# Patient Record
Sex: Female | Born: 1940 | Race: White | Hispanic: No | Marital: Married | Smoking: Never smoker
Health system: Southern US, Community
[De-identification: ages and names within clinical notes are randomized; demographics above are authoritative.]

## PROBLEM LIST (undated history)

## (undated) DIAGNOSIS — E039 Hypothyroidism, unspecified: Secondary | ICD-10-CM

## (undated) DIAGNOSIS — D649 Anemia, unspecified: Secondary | ICD-10-CM

## (undated) DIAGNOSIS — E7800 Pure hypercholesterolemia, unspecified: Secondary | ICD-10-CM

## (undated) DIAGNOSIS — I809 Phlebitis and thrombophlebitis of unspecified site: Secondary | ICD-10-CM

## (undated) DIAGNOSIS — K648 Other hemorrhoids: Secondary | ICD-10-CM

## (undated) DIAGNOSIS — Z860100 Personal history of colon polyps, unspecified: Secondary | ICD-10-CM

## (undated) DIAGNOSIS — K219 Gastro-esophageal reflux disease without esophagitis: Secondary | ICD-10-CM

## (undated) DIAGNOSIS — K5790 Diverticulosis of intestine, part unspecified, without perforation or abscess without bleeding: Secondary | ICD-10-CM

## (undated) HISTORY — PX: LAPAROSCOPIC GASTRIC BANDING: SHX1100

## (undated) HISTORY — PX: COLON SURGERY: SHX602

## (undated) HISTORY — DX: Phlebitis and thrombophlebitis of unspecified site: I80.9

## (undated) HISTORY — PX: DIAGNOSTIC LAPAROSCOPY: SUR761

## (undated) HISTORY — DX: Hypothyroidism, unspecified: E03.9

## (undated) HISTORY — DX: Gastro-esophageal reflux disease without esophagitis: K21.9

## (undated) HISTORY — DX: Anemia, unspecified: D64.9

## (undated) HISTORY — DX: Pure hypercholesterolemia, unspecified: E78.00

## (undated) HISTORY — PX: COLONOSCOPY: SHX174

---

## 1972-01-10 HISTORY — PX: URETHRAL DIVERTICULUM REPAIR: SHX5148

## 2003-12-21 ENCOUNTER — Ambulatory Visit: Payer: Self-pay | Admitting: Unknown Physician Specialty

## 2004-05-27 ENCOUNTER — Ambulatory Visit: Payer: Self-pay

## 2004-07-07 ENCOUNTER — Ambulatory Visit: Payer: Self-pay | Admitting: Internal Medicine

## 2004-07-20 ENCOUNTER — Ambulatory Visit: Payer: Self-pay | Admitting: Internal Medicine

## 2005-03-14 ENCOUNTER — Ambulatory Visit: Payer: Self-pay | Admitting: Unknown Physician Specialty

## 2006-03-20 ENCOUNTER — Ambulatory Visit: Payer: Self-pay | Admitting: Unknown Physician Specialty

## 2007-03-28 ENCOUNTER — Ambulatory Visit: Payer: Self-pay | Admitting: Unknown Physician Specialty

## 2007-07-09 ENCOUNTER — Ambulatory Visit: Payer: Self-pay | Admitting: Gastroenterology

## 2007-11-05 ENCOUNTER — Ambulatory Visit: Payer: Self-pay | Admitting: Gastroenterology

## 2007-11-29 ENCOUNTER — Ambulatory Visit: Payer: Self-pay | Admitting: Surgery

## 2008-01-07 ENCOUNTER — Ambulatory Visit: Payer: Self-pay | Admitting: Surgery

## 2008-01-14 ENCOUNTER — Inpatient Hospital Stay: Payer: Self-pay | Admitting: Surgery

## 2008-04-29 ENCOUNTER — Ambulatory Visit: Payer: Self-pay | Admitting: Unknown Physician Specialty

## 2009-01-18 ENCOUNTER — Ambulatory Visit: Payer: Self-pay | Admitting: Gastroenterology

## 2009-01-22 ENCOUNTER — Ambulatory Visit: Payer: Self-pay | Admitting: Gastroenterology

## 2009-06-09 ENCOUNTER — Ambulatory Visit: Payer: Self-pay | Admitting: Unknown Physician Specialty

## 2010-05-25 ENCOUNTER — Ambulatory Visit: Payer: Self-pay | Admitting: Internal Medicine

## 2010-06-10 ENCOUNTER — Ambulatory Visit: Payer: Self-pay | Admitting: Internal Medicine

## 2010-08-22 ENCOUNTER — Ambulatory Visit: Payer: Self-pay | Admitting: Unknown Physician Specialty

## 2010-10-17 LAB — BASIC METABOLIC PANEL: Glucose: 89 mg/dL

## 2010-10-18 LAB — HEPATIC FUNCTION PANEL
AST: 17 U/L (ref 13–35)
Alkaline Phosphatase: 67 U/L (ref 25–125)

## 2010-10-18 LAB — CBC AND DIFFERENTIAL: Platelets: 204 10*3/uL (ref 150–399)

## 2010-10-20 ENCOUNTER — Ambulatory Visit: Payer: Self-pay | Admitting: Internal Medicine

## 2010-11-17 ENCOUNTER — Ambulatory Visit: Payer: Self-pay | Admitting: Gastroenterology

## 2011-01-16 ENCOUNTER — Ambulatory Visit: Payer: Self-pay | Admitting: Gastroenterology

## 2011-01-19 LAB — PATHOLOGY REPORT

## 2011-01-26 LAB — HM COLONOSCOPY

## 2011-08-11 ENCOUNTER — Ambulatory Visit: Payer: Self-pay | Admitting: Orthopedic Surgery

## 2011-12-11 ENCOUNTER — Telehealth: Payer: Self-pay | Admitting: Internal Medicine

## 2011-12-11 NOTE — Telephone Encounter (Signed)
Caller: Sally/Patient; Phone: [phone]; Reason for Call: Patient is former patient of Dr. Lorin Picket, but has not been seen at Dr.  Marina Goodell new location.   Per Daytime Triage Information regarding new patient -unable to triage.  Transferred patient to office for  scheduling.

## 2011-12-11 NOTE — Telephone Encounter (Signed)
I can see her on 12/13/11 at 1:00.   Work in for this.  If any acute problems before this - needs evaluation.

## 2011-12-11 NOTE — Telephone Encounter (Signed)
Would continue the saline nasal spray - flush nose at least 2-3x/day and continue flonase.  If not allergic to robitussin/mucinex (guafinicin) then she can try mucinex in the am and robitussin in the evening.  If she is having persistent problems, I do recommend evaluation.

## 2011-12-11 NOTE — Telephone Encounter (Signed)
Patient has started having head ache on Saturday like she has in the past when she starts cleaning. She might have had a fever on Thursday last week.  She states that she has been seen at [facility] several times for this and states it is like a sinus infection.  Please advise when you would like to see patient. Triage couldn't triage due to patient new to our system.

## 2011-12-11 NOTE — Telephone Encounter (Signed)
Sent to [facility] in error.

## 2011-12-11 NOTE — Telephone Encounter (Signed)
Pt cannot come that day.  She is using generic flonase and salt spray.  Pt would like to know if there is any over the counter stuff she could get

## 2011-12-11 NOTE — Telephone Encounter (Signed)
Called pt.  Pt stated that she spoke with nurse here and she is waiting on call back

## 2011-12-11 NOTE — Telephone Encounter (Signed)
Informed patient of Dr. Lorin Picket recommendation and she agreed;will contact office for eval if symptoms become worse

## 2012-01-31 ENCOUNTER — Encounter: Payer: Self-pay | Admitting: *Deleted

## 2012-02-02 ENCOUNTER — Encounter: Payer: Self-pay | Admitting: Internal Medicine

## 2012-02-02 ENCOUNTER — Ambulatory Visit (INDEPENDENT_AMBULATORY_CARE_PROVIDER_SITE_OTHER): Payer: 59 | Admitting: Internal Medicine

## 2012-02-02 VITALS — BP 124/78 | HR 70 | Temp 98.4°F | Ht 65.5 in | Wt 266.2 lb

## 2012-02-02 DIAGNOSIS — E039 Hypothyroidism, unspecified: Secondary | ICD-10-CM

## 2012-02-02 DIAGNOSIS — Z860100 Personal history of colon polyps, unspecified: Secondary | ICD-10-CM

## 2012-02-02 DIAGNOSIS — R5381 Other malaise: Secondary | ICD-10-CM

## 2012-02-02 DIAGNOSIS — Z23 Encounter for immunization: Secondary | ICD-10-CM

## 2012-02-02 DIAGNOSIS — Z139 Encounter for screening, unspecified: Secondary | ICD-10-CM

## 2012-02-02 DIAGNOSIS — E559 Vitamin D deficiency, unspecified: Secondary | ICD-10-CM

## 2012-02-02 DIAGNOSIS — R5383 Other fatigue: Secondary | ICD-10-CM

## 2012-02-02 DIAGNOSIS — E7800 Pure hypercholesterolemia, unspecified: Secondary | ICD-10-CM

## 2012-02-02 DIAGNOSIS — K219 Gastro-esophageal reflux disease without esophagitis: Secondary | ICD-10-CM

## 2012-02-02 MED ORDER — PNEUMOCOCCAL VAC POLYVALENT 25 MCG/0.5ML IJ INJ
0.5000 mL | INJECTION | Freq: Once | INTRAMUSCULAR | Status: AC
  Administered 2012-02-02: 0.5 mL via INTRAMUSCULAR

## 2012-02-04 ENCOUNTER — Encounter: Payer: Self-pay | Admitting: Internal Medicine

## 2012-02-04 DIAGNOSIS — K219 Gastro-esophageal reflux disease without esophagitis: Secondary | ICD-10-CM | POA: Insufficient documentation

## 2012-02-04 DIAGNOSIS — E559 Vitamin D deficiency, unspecified: Secondary | ICD-10-CM | POA: Insufficient documentation

## 2012-02-04 DIAGNOSIS — E039 Hypothyroidism, unspecified: Secondary | ICD-10-CM | POA: Insufficient documentation

## 2012-02-04 DIAGNOSIS — Z86010 Personal history of colonic polyps: Secondary | ICD-10-CM | POA: Insufficient documentation

## 2012-02-04 DIAGNOSIS — E7800 Pure hypercholesterolemia, unspecified: Secondary | ICD-10-CM | POA: Insufficient documentation

## 2012-02-04 NOTE — Assessment & Plan Note (Signed)
Low cholesterol diet, exercise and weight loss.  Check lipid panel.   

## 2012-02-04 NOTE — Assessment & Plan Note (Signed)
EGD 01/16/11 revealed multiple gastric polyps and a hiatal hernia.  Will start zantac regularly now.  Felt the nexium caused increased gas.  Follow.

## 2012-02-04 NOTE — Assessment & Plan Note (Signed)
Continue synthroid.  Check tsh.   

## 2012-02-04 NOTE — Assessment & Plan Note (Signed)
S/p laparoscopic assisted partial colectomy for a tubular adenomatous polyp.  Follow up colonoscopy 01/18/09 revealed diverticulosis, a 3mm polyp in the transverse colon and non bleeding internal hemorrhoids (abnormal appearing appendicial orifice.  Subsequently had a CT scan and found to have a redundant colon.  Repeat colonoscopy 1/13 - diverticulosis and one polyp.  Recommended follow up colonoscopy 01/2014.

## 2012-02-04 NOTE — Progress Notes (Signed)
  Subjective:    Patient ID: Jodi Harris, female    DOB: September 25, 1940, 72 y.o.   MRN: [id number]  HPI 72 year old female with past history of hypothyroidism and gastric surgery (lap band) who comes in today for a scheduled follow up.  States she is having some increased gas.  Had more when she was taking nexium.  Does not seem to notice as much with zantac.  Takes tums.  Some occasional reflux. Not taking the zantac regularly.  No abdominal pain now.  No urine change or bowel change.  Not exercising regularly.  Plans to get more serious about her diet and exercise.  Has not been doing as well watching what she eats.   Past Medical History  Diagnosis Date  . Hypothyroidism   . Hypercholesterolemia   . GERD (gastroesophageal reflux disease)   . Anemia   . Thrombophlebitis     varicosities    Current Outpatient Prescriptions on File Prior to Visit  Medication Sig Dispense Refill  . Cholecalciferol (VITAMIN D-3 PO) Take 2,000 Units by mouth daily.      . fluticasone (FLONASE) 50 MCG/ACT nasal spray Place 2 sprays into the nose as needed.       Marland Kitchen levothyroxine (SYNTHROID, LEVOTHROID) 125 MCG tablet Take 125 mcg by mouth daily.      . Multiple Vitamins-Minerals (MULTIVITAMIN PO) Take by mouth.      . Naproxen Sodium (ALEVE PO) Take by mouth as needed.       . Ranitidine HCl (ZANTAC PO) Take by mouth.        Review of Systems Patient denies any headache, lightheadedness or dizziness.  No sinus or allergy symptoms.  No chest pain, tightness or palpotations.  No increased shortness of breath, cough or congestion.  No nausea or vomiting.  Some reflux and increased gas as outlined.  No abdominal pain or cramping.  No bowel change, such as diarrhea, constipation, BRBPR or melana.  No urine change.   Some fatigue.  Plans to start exercising more.       Objective:   Physical Exam Filed Vitals:   02/02/12 1022  BP: 124/78  Pulse: 70  Temp: 98.4 F (36.9 C)   Blood pressure recheck:  122/78,  pulse 18  71 year old female in no acute distress.   HEENT:  Nares- clear.  Oropharynx - without lesions. NECK:  Supple.  Nontender.  No audible bruit.  HEART:  Appears to be regular. LUNGS:  No crackles or wheezing audible.  Respirations even and unlabored.  RADIAL PULSE:  Equal bilaterally.   ABDOMEN:  Soft, nontender.  Bowel sounds present and normal.  No audible abdominal bruit.   EXTREMITIES:  No increased edema present.  DP pulses palpable and equal bilaterally.           Assessment & Plan:  INCREASED GAS.  Will remain off the PPI.  Trial of zantac regularly.  Monitor if dietary triggers.  Follow.    INCREASED PSYCHOSOCIAL STRESSORS.  On no medication now.  Doing well.  Follow.    FATIGUE.  See above.  Check cbc, met c and tsh.  Plans to start exercising.    GYN.  Has been followed at [facility].  Obtain records.    HEALTH MAINTENANCE.  Breast, pelvic and paps at gyn.  Colonoscopy as outlined below.  Schedule mammogram.

## 2012-02-04 NOTE — Assessment & Plan Note (Signed)
Taking her vitamin d supplements.  Follow vitamin D level.    

## 2012-02-07 ENCOUNTER — Other Ambulatory Visit: Payer: 59

## 2012-02-08 ENCOUNTER — Other Ambulatory Visit: Payer: 59

## 2012-02-15 ENCOUNTER — Other Ambulatory Visit (INDEPENDENT_AMBULATORY_CARE_PROVIDER_SITE_OTHER): Payer: 59

## 2012-02-15 DIAGNOSIS — R5383 Other fatigue: Secondary | ICD-10-CM

## 2012-02-15 DIAGNOSIS — E7800 Pure hypercholesterolemia, unspecified: Secondary | ICD-10-CM

## 2012-02-15 DIAGNOSIS — R5381 Other malaise: Secondary | ICD-10-CM

## 2012-02-15 LAB — CBC WITH DIFFERENTIAL/PLATELET
Basophils Absolute: 0 10*3/uL (ref 0.0–0.1)
Basophils Relative: 0.5 % (ref 0.0–3.0)
Eosinophils Absolute: 0.1 10*3/uL (ref 0.0–0.7)
HCT: 39.1 % (ref 36.0–46.0)
Hemoglobin: 12.8 g/dL (ref 12.0–15.0)
Lymphs Abs: 1.5 10*3/uL (ref 0.7–4.0)
MCHC: 32.8 g/dL (ref 30.0–36.0)
MCV: 87.4 fl (ref 78.0–100.0)
Neutro Abs: 2.6 10*3/uL (ref 1.4–7.7)
RBC: 4.47 Mil/uL (ref 3.87–5.11)
RDW: 14.1 % (ref 11.5–14.6)

## 2012-02-15 LAB — COMPREHENSIVE METABOLIC PANEL
ALT: 12 U/L (ref 0–35)
AST: 19 U/L (ref 0–37)
BUN: 17 mg/dL (ref 6–23)
Calcium: 8.8 mg/dL (ref 8.4–10.5)
Chloride: 102 mEq/L (ref 96–112)
Creatinine, Ser: 0.8 mg/dL (ref 0.4–1.2)
GFR: 80.91 mL/min (ref >60.00)
Total Bilirubin: 0.7 mg/dL (ref 0.3–1.2)

## 2012-02-15 LAB — LIPID PANEL
Cholesterol: 208 mg/dL — ABNORMAL HIGH (ref 0–200)
HDL: 53.8 mg/dL (ref >39.00)
Total CHOL/HDL Ratio: 4
Triglycerides: 69 mg/dL (ref 0.0–149.0)

## 2012-02-15 LAB — LDL CHOLESTEROL, DIRECT: Direct LDL: 129.8 mg/dL

## 2012-02-29 ENCOUNTER — Ambulatory Visit: Payer: Self-pay | Admitting: Internal Medicine

## 2012-03-13 ENCOUNTER — Encounter: Payer: Self-pay | Admitting: Internal Medicine

## 2012-04-19 ENCOUNTER — Other Ambulatory Visit: Payer: Self-pay | Admitting: *Deleted

## 2012-04-19 MED ORDER — RANITIDINE HCL 150 MG PO TABS: 150.0000 mg | ORAL_TABLET | Freq: Every day | ORAL | Status: DC

## 2012-04-19 NOTE — Telephone Encounter (Signed)
Please advise.... Refill on zantac

## 2012-04-20 ENCOUNTER — Telehealth: Payer: Self-pay | Admitting: Internal Medicine

## 2012-04-20 MED ORDER — RANITIDINE HCL 150 MG PO TABS: 150.0000 mg | ORAL_TABLET | Freq: Two times a day (BID) | ORAL | Status: DC

## 2012-04-20 NOTE — Telephone Encounter (Signed)
rx corrected.  Ranitidine should be 150mg  bid.  Corrected.  Refilled x 4

## 2012-05-13 ENCOUNTER — Telehealth: Payer: Self-pay | Admitting: *Deleted

## 2012-05-13 MED ORDER — LEVOTHYROXINE SODIUM 125 MCG PO TABS: 125.0000 ug | ORAL_TABLET | Freq: Every day | ORAL | Status: DC

## 2012-05-13 NOTE — Telephone Encounter (Signed)
Sent refill medication of Synthroid to pharmacy #90 1rf

## 2012-05-13 NOTE — Telephone Encounter (Signed)
Synthroid 125 mcg tablet  #90  Take one tablet daily

## 2012-06-20 ENCOUNTER — Ambulatory Visit (INDEPENDENT_AMBULATORY_CARE_PROVIDER_SITE_OTHER): Payer: 59 | Admitting: Internal Medicine

## 2012-06-20 ENCOUNTER — Encounter: Payer: Self-pay | Admitting: Internal Medicine

## 2012-06-20 VITALS — BP 110/60 | HR 61 | Temp 98.0°F | Ht 65.5 in | Wt 273.2 lb

## 2012-06-20 DIAGNOSIS — Z86010 Personal history of colonic polyps: Secondary | ICD-10-CM

## 2012-06-20 DIAGNOSIS — E559 Vitamin D deficiency, unspecified: Secondary | ICD-10-CM

## 2012-06-20 DIAGNOSIS — K219 Gastro-esophageal reflux disease without esophagitis: Secondary | ICD-10-CM

## 2012-06-20 DIAGNOSIS — E039 Hypothyroidism, unspecified: Secondary | ICD-10-CM

## 2012-06-20 DIAGNOSIS — R350 Frequency of micturition: Secondary | ICD-10-CM

## 2012-06-20 DIAGNOSIS — E7800 Pure hypercholesterolemia, unspecified: Secondary | ICD-10-CM

## 2012-06-20 LAB — POCT URINALYSIS DIPSTICK
Bilirubin, UA: NEGATIVE
Blood, UA: NEGATIVE
Glucose, UA: NEGATIVE
Ketones, UA: NEGATIVE
Leukocytes, UA: NEGATIVE
Nitrite, UA: NEGATIVE

## 2012-06-20 MED ORDER — FLUOXETINE HCL 10 MG PO CAPS: 10.0000 mg | ORAL_CAPSULE | Freq: Every day | ORAL | Status: DC

## 2012-06-22 LAB — URINE CULTURE: Organism ID, Bacteria: NO GROWTH

## 2012-06-30 ENCOUNTER — Encounter: Payer: Self-pay | Admitting: Internal Medicine

## 2012-06-30 NOTE — Assessment & Plan Note (Signed)
EGD 01/16/11 revealed multiple gastric polyps and a hiatal hernia.  Apparently tolerates nexium 20mg  q day.  Follow.

## 2012-06-30 NOTE — Assessment & Plan Note (Signed)
S/p laparoscopic assisted partial colectomy for a tubular adenomatous polyp.  Follow up colonoscopy 01/18/09 revealed diverticulosis, a 3mm polyp in the transverse colon and non bleeding internal hemorrhoids (abnormal appearing appendicial orifice.  Subsequently had a CT scan and found to have a redundant colon.  Repeat colonoscopy 1/13 - diverticulosis and one polyp.  Recommended follow up colonoscopy 01/2014.  Bowels stable.   

## 2012-06-30 NOTE — Assessment & Plan Note (Signed)
Low cholesterol diet, exercise and weight loss.  Follow lipid panel.  

## 2012-06-30 NOTE — Assessment & Plan Note (Signed)
Taking her vitamin d supplements.  Follow vitamin D level.    

## 2012-06-30 NOTE — Assessment & Plan Note (Signed)
Continue synthroid. Follow tsh.  

## 2012-06-30 NOTE — Progress Notes (Signed)
  Subjective:    Patient ID: Jodi Harris, female    DOB: [birth year], 72 y.o.   MRN: [id number]  HPI 72 year old female with past history of hypothyroidism and gastric surgery (lap band) who comes in today for a scheduled follow up.  Breathing stable. Discussed taking zantac for her reflux. Previously had some increased gas with 40mg  of nexium.  Tolerates the 20mg  nexium.   No urine change or bowel change.  Not exercising regularly.  Plans to get more serious about her diet and exercise.  Has not been doing as well watching what she eats.  Increased stress.  Previously on Wellbutrin.  Feels she needs something to help level things off.     Past Medical History  Diagnosis Date  . Hypothyroidism   . Hypercholesterolemia   . GERD (gastroesophageal reflux disease)   . Anemia   . Thrombophlebitis     varicosities    Current Outpatient Prescriptions on File Prior to Visit  Medication Sig Dispense Refill  . calcium carbonate (TUMS EX) 750 MG chewable tablet Chew 1 tablet by mouth daily.      . Cholecalciferol (VITAMIN D-3 PO) Take 2,000 Units by mouth daily.      . fluticasone (FLONASE) 50 MCG/ACT nasal spray Place 2 sprays into the nose as needed.       Marland Kitchen levothyroxine (SYNTHROID, LEVOTHROID) 125 MCG tablet Take 1 tablet (125 mcg total) by mouth daily.  90 tablet  1  . Multiple Vitamins-Minerals (MULTIVITAMIN PO) Take by mouth.      . Naproxen Sodium (ALEVE PO) Take by mouth as needed.        No current facility-administered medications on file prior to visit.    Review of Systems Patient denies any headache, lightheadedness or dizziness.  No sinus or allergy symptoms.  No chest pain, tightness or palpotations.  No increased shortness of breath, cough or congestion.  No nausea or vomiting.  Some reflux and increased gas as outlined.  No abdominal pain or cramping.  No bowel change, such as diarrhea, constipation, BRBPR or melana.  No urine change.  Plans to start exercising more.        Objective:   Physical Exam  Filed Vitals:   06/20/12 1513  BP: 110/60  Pulse: 61  Temp: 98 F (88.73 C)   72 year old female in no acute distress.   HEENT:  Nares- clear.  Oropharynx - without lesions. NECK:  Supple.  Nontender.  No audible bruit.  HEART:  Appears to be regular. LUNGS:  No crackles or wheezing audible.  Respirations even and unlabored.  RADIAL PULSE:  Equal bilaterally.   ABDOMEN:  Soft, nontender.  Bowel sounds present and normal.  No audible abdominal bruit.   EXTREMITIES:  No increased edema present.  DP pulses palpable and equal bilaterally.           Assessment & Plan:  INCREASED PSYCHOSOCIAL STRESSORS.  Discussed at length with her today.  Discussed treatment options.  Will start Prozac 10mg  q day.  Follow.    URINARY FREQUENCY.  Will check urinalysis and culture to confirm no infection.   GYN.  Has been followed at [facility].  States up to date.  Need records.     HEALTH MAINTENANCE.  Breast, pelvic and paps at gyn.  Colonoscopy as outlined.  Mammogram 02/29/12 - Birads I.

## 2012-08-05 ENCOUNTER — Ambulatory Visit (INDEPENDENT_AMBULATORY_CARE_PROVIDER_SITE_OTHER): Payer: 59 | Admitting: Internal Medicine

## 2012-08-05 ENCOUNTER — Encounter: Payer: Self-pay | Admitting: Internal Medicine

## 2012-08-05 VITALS — BP 110/60 | HR 62 | Temp 98.1°F | Ht 65.5 in | Wt 273.8 lb

## 2012-08-05 DIAGNOSIS — E7800 Pure hypercholesterolemia, unspecified: Secondary | ICD-10-CM

## 2012-08-05 DIAGNOSIS — Z86010 Personal history of colonic polyps: Secondary | ICD-10-CM

## 2012-08-05 DIAGNOSIS — E039 Hypothyroidism, unspecified: Secondary | ICD-10-CM

## 2012-08-05 DIAGNOSIS — K219 Gastro-esophageal reflux disease without esophagitis: Secondary | ICD-10-CM

## 2012-08-05 DIAGNOSIS — E559 Vitamin D deficiency, unspecified: Secondary | ICD-10-CM

## 2012-08-05 MED ORDER — FLUOXETINE HCL 10 MG PO CAPS: 10.0000 mg | ORAL_CAPSULE | Freq: Every day | ORAL | Status: DC

## 2012-08-05 NOTE — Assessment & Plan Note (Signed)
S/p laparoscopic assisted partial colectomy for a tubular adenomatous polyp.  Follow up colonoscopy 01/18/09 revealed diverticulosis, a 3mm polyp in the transverse colon and non bleeding internal hemorrhoids (abnormal appearing appendicial orifice.  Subsequently had a CT scan and found to have a redundant colon.  Repeat colonoscopy 1/13 - diverticulosis and one polyp.  Recommended follow up colonoscopy 01/2014.  Bowels stable.   

## 2012-08-05 NOTE — Assessment & Plan Note (Signed)
Low cholesterol diet, exercise and weight loss.  Follow lipid panel.  

## 2012-08-05 NOTE — Assessment & Plan Note (Signed)
Taking her vitamin d supplements.  Follow vitamin D level.    

## 2012-08-05 NOTE — Assessment & Plan Note (Signed)
Continue synthroid. Follow tsh.  

## 2012-08-05 NOTE — Progress Notes (Signed)
Subjective:    Patient ID: Jodi Harris, female    DOB: August 18, 1940, 72 y.o.   MRN: [id number]  HPI 72 year old female with past history of hypothyroidism and gastric surgery (lap band) who comes in today for a scheduled follow up.  Breathing stable.   No urine change or bowel change.  Not exercising regularly.  Plans to get more serious about her diet and exercise.  Planning to start going to water aerobics with a group.  Has not been doing as well watching what she eats.  Again,plans to start.  saw the surgeon for her lap band.  Has follow up with him 08/13/12.  Last visit was started on prozac.  Is tolerating and feels better.  Sleeping better.  Feels this dose is adequate.  Occasionally will feel a little light headed, but does not relate this to the medication.  Rarely occurs.  Goes away quickly.  No significant light headedness or dizziness.  No headache.  Overall she feels she is doing well.    Past Medical History  Diagnosis Date  . Hypothyroidism   . Hypercholesterolemia   . GERD (gastroesophageal reflux disease)   . Anemia   . Thrombophlebitis     varicosities    Current Outpatient Prescriptions on File Prior to Visit  Medication Sig Dispense Refill  . calcium carbonate (TUMS EX) 750 MG chewable tablet Chew 1 tablet by mouth as needed.       . Cholecalciferol (VITAMIN D-3 PO) Take 2,000 Units by mouth daily.      Marland Kitchen esomeprazole (NEXIUM) 20 MG capsule Take 20 mg by mouth daily.      . fluticasone (FLONASE) 50 MCG/ACT nasal spray Place 2 sprays into the nose as needed.       Marland Kitchen levothyroxine (SYNTHROID, LEVOTHROID) 125 MCG tablet Take 1 tablet (125 mcg total) by mouth daily.  90 tablet  1  . Multiple Vitamins-Minerals (MULTIVITAMIN PO) Take by mouth.      . Naproxen Sodium (ALEVE PO) Take by mouth as needed.        No current facility-administered medications on file prior to visit.    Review of Systems Patient denies any headache.  Some minimal congestion at times, which she feels  may contribute to some of the light headedness.  No chest pain, tightness or palpitations.  No increased shortness of breath, cough or congestion.  No nausea or vomiting.  No acid reflux reported.   No abdominal pain or cramping.  No bowel change, such as diarrhea, constipation, BRBPR or melana.  No urine change.  Plans to start exercising more.  Sleeping better on Prozac.       Objective:   Physical Exam  Filed Vitals:   08/05/12 1049  BP: 110/60  Pulse: 62  Temp: 98.1 F (36.7 C)   Blood pressure recheck:  122/70, pulse 18  72 year old female in no acute distress.   HEENT:  Nares- clear.  Oropharynx - without lesions.  TMs without erythema - clear.  NECK:  Supple.  Nontender.  No audible bruit.  HEART:  Appears to be regular. LUNGS:  No crackles or wheezing audible.  Respirations even and unlabored.  RADIAL PULSE:  Equal bilaterally.   ABDOMEN:  Soft, nontender.  Bowel sounds present and normal.  No audible abdominal bruit.   EXTREMITIES:  No increased edema present.  DP pulses palpable and equal bilaterally.           Assessment & Plan:  INCREASED PSYCHOSOCIAL  STRESSORS.  On Prozac 10mg  q day.  Doing better.  Sleeping better.  Follow.    GYN.  Has been followed at [facility].  States up to date.  Need records.    LIGHT HEADEDNESS.  Not a significant issue for her.  Will use her saline nasal spray and treat allergies and congestion.  Follow.  Will notify me if a persistent problem.     HEALTH MAINTENANCE.  Breast, pelvic and paps at gyn.  Colonoscopy as outlined.  Mammogram 02/29/12 - Birads I.

## 2012-08-05 NOTE — Assessment & Plan Note (Signed)
EGD 01/16/11 revealed multiple gastric polyps and a hiatal hernia.  Tolerates nexium 20mg q day.  Follow.    

## 2012-09-10 ENCOUNTER — Other Ambulatory Visit: Payer: Self-pay | Admitting: *Deleted

## 2012-12-09 ENCOUNTER — Encounter (INDEPENDENT_AMBULATORY_CARE_PROVIDER_SITE_OTHER): Payer: Self-pay

## 2012-12-09 ENCOUNTER — Ambulatory Visit (INDEPENDENT_AMBULATORY_CARE_PROVIDER_SITE_OTHER): Payer: 59 | Admitting: Internal Medicine

## 2012-12-09 ENCOUNTER — Encounter: Payer: Self-pay | Admitting: Internal Medicine

## 2012-12-09 VITALS — BP 120/60 | HR 64 | Temp 97.9°F | Ht 65.5 in | Wt 256.0 lb

## 2012-12-09 DIAGNOSIS — E039 Hypothyroidism, unspecified: Secondary | ICD-10-CM

## 2012-12-09 DIAGNOSIS — Z860100 Personal history of colon polyps, unspecified: Secondary | ICD-10-CM

## 2012-12-09 DIAGNOSIS — E7800 Pure hypercholesterolemia, unspecified: Secondary | ICD-10-CM

## 2012-12-09 DIAGNOSIS — E559 Vitamin D deficiency, unspecified: Secondary | ICD-10-CM

## 2012-12-09 DIAGNOSIS — K219 Gastro-esophageal reflux disease without esophagitis: Secondary | ICD-10-CM

## 2012-12-09 MED ORDER — LEVOTHYROXINE SODIUM 125 MCG PO TABS: 125.0000 ug | ORAL_TABLET | Freq: Every day | ORAL | Status: DC

## 2012-12-09 NOTE — Progress Notes (Signed)
Subjective:    Patient ID: Jodi Harris, female    DOB: 10-27-1940, 72 y.o.   MRN: [id number]  HPI 72 year old female with past history of hypothyroidism and gastric surgery (lap band) who comes in today for a scheduled follow up.  Breathing stable.   No urine change or bowel change.  She is watching what she eats.  Has cut out bread.  Has lost weight.  Feels better.  On prozac.  Is tolerating and feels better.  Sleeping better.  Feels this dose is adequate.   No significant light headedness or dizziness.  No headache.  Overall she feels she is doing well.  She does report that occasionally she hears her heartbeat in her left ear.  No pain.  Blood pressure doing well.     Past Medical History  Diagnosis Date  . Hypothyroidism   . Hypercholesterolemia   . GERD (gastroesophageal reflux disease)   . Anemia   . Thrombophlebitis     varicosities    Current Outpatient Prescriptions on File Prior to Visit  Medication Sig Dispense Refill  . calcium carbonate (TUMS EX) 750 MG chewable tablet Chew 1 tablet by mouth as needed.       . Cholecalciferol (VITAMIN D-3 PO) Take 2,000 Units by mouth daily.      Marland Kitchen esomeprazole (NEXIUM) 20 MG capsule Take 20 mg by mouth daily.      Marland Kitchen FLUoxetine (PROZAC) 10 MG capsule Take 1 capsule (10 mg total) by mouth daily.  90 capsule  1  . fluticasone (FLONASE) 50 MCG/ACT nasal spray Place 2 sprays into the nose as needed.       Marland Kitchen levothyroxine (SYNTHROID, LEVOTHROID) 125 MCG tablet Take 1 tablet (125 mcg total) by mouth daily.  90 tablet  1  . Multiple Vitamins-Minerals (MULTIVITAMIN PO) Take by mouth.      . Naproxen Sodium (ALEVE PO) Take by mouth as needed.        No current facility-administered medications on file prior to visit.    Review of Systems Patient denies any headache.  No ear pain.  No light headedness or dizziness.  No chest pain, tightness or palpitations.  No increased shortness of breath, cough or congestion.  No nausea or vomiting.  No acid  reflux reported.   No abdominal pain or cramping.  No bowel change, such as diarrhea, constipation, BRBPR or melana.  No urine change.  Doing well on Prozac.  Adjusting her diet.  Losing weight.  Overall she feels she is doing well.       Objective:   Physical Exam  Filed Vitals:   12/09/12 1113  BP: 120/60  Pulse: 64  Temp: 97.9 F (36.6 C)   Blood pressure recheck:  60/65  72 year old female in no acute distress.   HEENT:  Nares- clear.  Oropharynx - without lesions.  TMs without erythema - clear.  NECK:  Supple.  Nontender.  No audible bruit.  HEART:  Appears to be regular. LUNGS:  No crackles or wheezing audible.  Respirations even and unlabored.  RADIAL PULSE:  Equal bilaterally.   ABDOMEN:  Soft, nontender.  Bowel sounds present and normal.  No audible abdominal bruit.   EXTREMITIES:  No increased edema present.  DP pulses palpable and equal bilaterally.           Assessment & Plan:  INCREASED PSYCHOSOCIAL STRESSORS.  On Prozac 10mg  q day.  Doing better.  Sleeping better.  Follow.    GYN.  Has been followed at [facility].  States up to date.     LIGHT HEADEDNESS.  Resolved.   ENT.  Can hear her heart beat in her ear (occasionally).  No pain.  No abnormality noted on exam.  Blood pressure doing well.  Discussed further evaluation and w/up.  She declines.  Will notify me if persistent.       HEALTH MAINTENANCE.  Breast, pelvic and paps at gyn.  Colonoscopy as outlined.  Mammogram 02/29/12 - Birads I.

## 2012-12-09 NOTE — Progress Notes (Signed)
Pre-visit discussion using our clinic review tool. No additional management support is needed unless otherwise documented below in the visit note.  

## 2012-12-12 ENCOUNTER — Encounter: Payer: Self-pay | Admitting: Internal Medicine

## 2012-12-12 NOTE — Assessment & Plan Note (Signed)
Taking her vitamin d supplements.  Follow vitamin D level.    

## 2012-12-12 NOTE — Assessment & Plan Note (Signed)
S/p laparoscopic assisted partial colectomy for a tubular adenomatous polyp.  Follow up colonoscopy 01/18/09 revealed diverticulosis, a 3mm polyp in the transverse colon and non bleeding internal hemorrhoids (abnormal appearing appendicial orifice.  Subsequently had a CT scan and found to have a redundant colon.  Repeat colonoscopy 1/13 - diverticulosis and one polyp.  Recommended follow up colonoscopy 01/2014.  Bowels stable.   

## 2012-12-12 NOTE — Assessment & Plan Note (Signed)
Continue synthroid. Follow tsh.  

## 2012-12-12 NOTE — Assessment & Plan Note (Signed)
Low cholesterol diet, exercise and weight loss.  Follow lipid panel.  

## 2012-12-12 NOTE — Assessment & Plan Note (Signed)
EGD 01/16/11 revealed multiple gastric polyps and a hiatal hernia.  Tolerates nexium 20mg q day.  Follow.    

## 2013-03-03 ENCOUNTER — Other Ambulatory Visit: Payer: Self-pay | Admitting: *Deleted

## 2013-03-03 ENCOUNTER — Ambulatory Visit: Payer: Self-pay | Admitting: Internal Medicine

## 2013-03-03 LAB — HM MAMMOGRAPHY

## 2013-03-03 MED ORDER — FLUOXETINE HCL 10 MG PO CAPS: 10.0000 mg | ORAL_CAPSULE | Freq: Every day | ORAL | Status: DC

## 2013-03-07 ENCOUNTER — Encounter: Payer: Self-pay | Admitting: Internal Medicine

## 2013-03-11 ENCOUNTER — Ambulatory Visit: Payer: Self-pay | Admitting: Internal Medicine

## 2013-03-11 LAB — HM MAMMOGRAPHY

## 2013-03-12 ENCOUNTER — Encounter: Payer: Self-pay | Admitting: Internal Medicine

## 2013-04-04 ENCOUNTER — Encounter: Payer: Self-pay | Admitting: Internal Medicine

## 2013-04-10 ENCOUNTER — Encounter (INDEPENDENT_AMBULATORY_CARE_PROVIDER_SITE_OTHER): Payer: Self-pay

## 2013-04-10 ENCOUNTER — Ambulatory Visit (INDEPENDENT_AMBULATORY_CARE_PROVIDER_SITE_OTHER): Payer: 59 | Admitting: Internal Medicine

## 2013-04-10 ENCOUNTER — Encounter: Payer: Self-pay | Admitting: Internal Medicine

## 2013-04-10 VITALS — BP 130/60 | HR 65 | Temp 98.0°F | Ht 65.5 in | Wt 258.5 lb

## 2013-04-10 DIAGNOSIS — E559 Vitamin D deficiency, unspecified: Secondary | ICD-10-CM

## 2013-04-10 DIAGNOSIS — E7800 Pure hypercholesterolemia, unspecified: Secondary | ICD-10-CM

## 2013-04-10 DIAGNOSIS — Z86010 Personal history of colonic polyps: Secondary | ICD-10-CM

## 2013-04-10 DIAGNOSIS — E039 Hypothyroidism, unspecified: Secondary | ICD-10-CM

## 2013-04-10 DIAGNOSIS — L989 Disorder of the skin and subcutaneous tissue, unspecified: Secondary | ICD-10-CM

## 2013-04-10 DIAGNOSIS — K219 Gastro-esophageal reflux disease without esophagitis: Secondary | ICD-10-CM

## 2013-04-10 MED ORDER — TRIAMCINOLONE ACETONIDE 0.1 % EX CREA: 1.0000 "application " | TOPICAL_CREAM | Freq: Two times a day (BID) | CUTANEOUS | Status: DC

## 2013-04-10 MED ORDER — FLUOXETINE HCL 10 MG PO CAPS: 10.0000 mg | ORAL_CAPSULE | Freq: Every day | ORAL | Status: DC

## 2013-04-10 MED ORDER — LEVOTHYROXINE SODIUM 125 MCG PO TABS: 125.0000 ug | ORAL_TABLET | Freq: Every day | ORAL | Status: DC

## 2013-04-10 NOTE — Progress Notes (Signed)
Pre-visit discussion using our clinic review tool. No additional management support is needed unless otherwise documented below in the visit note.  

## 2013-04-10 NOTE — Progress Notes (Signed)
Subjective:    Patient ID: Jodi Harris, female    DOB: 05/10/1940, 73 y.o.   MRN: [id number]  HPI 73 year old female with past history of hypothyroidism and gastric surgery (lap band) who comes in today for a scheduled follow up.  Breathing stable.   No urine change or bowel change.  She is watching what she eats.  On prozac.  Is tolerating and feels better.  Sleeping better.  Feels this dose is adequate.   No light headedness or dizziness.  No headache.  Overall she feels she is doing well.   Blood pressure doing well.  She is concerned about some skin lesions located on her right lateral lower leg, left mid leg and left medial ankle.     Past Medical History  Diagnosis Date  . Hypothyroidism   . Hypercholesterolemia   . GERD (gastroesophageal reflux disease)   . Anemia   . Thrombophlebitis     varicosities    Current Outpatient Prescriptions on File Prior to Visit  Medication Sig Dispense Refill  . calcium carbonate (TUMS EX) 750 MG chewable tablet Chew 1 tablet by mouth as needed.       . Cholecalciferol (VITAMIN D-3 PO) Take 2,000 Units by mouth daily.      Marland Kitchen. esomeprazole (NEXIUM) 20 MG capsule Take 20 mg by mouth daily.      Marland Kitchen. FLUoxetine (PROZAC) 10 MG capsule Take 1 capsule (10 mg total) by mouth daily.  90 capsule  0  . fluticasone (FLONASE) 50 MCG/ACT nasal spray Place 2 sprays into the nose as needed.       Marland Kitchen. levothyroxine (SYNTHROID, LEVOTHROID) 125 MCG tablet Take 1 tablet (125 mcg total) by mouth daily.  90 tablet  1  . Multiple Vitamins-Minerals (MULTIVITAMIN PO) Take by mouth.      . Naproxen Sodium (ALEVE PO) Take by mouth as needed.        No current facility-administered medications on file prior to visit.    Review of Systems Patient denies any headache.  No light headedness or dizziness.  No chest pain, tightness or palpitations.  No increased shortness of breath, cough or congestion.  No nausea or vomiting.  No acid reflux reported.   No abdominal pain or  cramping.  No bowel change, such as diarrhea, constipation, BRBPR or melana.  No urine change.  Doing well on Prozax.  Overall she feels she is doing well.  Skin lesions as outlined.       Objective:   Physical Exam  Filed Vitals:   04/10/13 1121  BP: 130/60  Pulse: 65  Temp: 98 F (3636.357 C)   73 year old female in no acute distress.   HEENT:  Nares- clear.  Oropharynx - without lesions.  NECK:  Supple.  Nontender.  No audible bruit.  HEART:  Appears to be regular. LUNGS:  No crackles or wheezing audible.  Respirations even and unlabored.  RADIAL PULSE:  Equal bilaterally.   ABDOMEN:  Soft, nontender.  Bowel sounds present and normal.  No audible abdominal bruit.   EXTREMITIES:  No increased edema present.  DP pulses palpable and equal bilaterally.           Assessment & Plan:  INCREASED PSYCHOSOCIAL STRESSORS.  On Prozac 10mg  q day.  Doing better.  Sleeping better.  Follow.    GYN.  Has been followed at [facility].  States up to date.     LIGHT HEADEDNESS.  Resolved.   HEALTH MAINTENANCE.  Breast,  pelvic and paps at gyn.  Colonoscopy as outlined.  Mammogram 02/29/12 - Birads I.  F/u mammogram through gyn.

## 2013-04-13 ENCOUNTER — Encounter: Payer: Self-pay | Admitting: Internal Medicine

## 2013-04-13 DIAGNOSIS — L989 Disorder of the skin and subcutaneous tissue, unspecified: Secondary | ICD-10-CM | POA: Insufficient documentation

## 2013-04-13 NOTE — Assessment & Plan Note (Signed)
Continue synthroid. Follow tsh.  

## 2013-04-13 NOTE — Assessment & Plan Note (Signed)
EGD 01/16/11 revealed multiple gastric polyps and a hiatal hernia.  Tolerates nexium 20mg  q day.  Follow.

## 2013-04-13 NOTE — Assessment & Plan Note (Signed)
Persistent skin lesions as outlined.  Refer to dermatology for evaluation and treatment.

## 2013-04-13 NOTE — Assessment & Plan Note (Signed)
Taking her vitamin d supplements.  Follow vitamin D level.

## 2013-04-13 NOTE — Assessment & Plan Note (Signed)
S/p laparoscopic assisted partial colectomy for a tubular adenomatous polyp.  Follow up colonoscopy 01/18/09 revealed diverticulosis, a 3mm polyp in the transverse colon and non bleeding internal hemorrhoids (abnormal appearing appendicial orifice.  Subsequently had a CT scan and found to have a redundant colon.  Repeat colonoscopy 1/13 - diverticulosis and one polyp.  Recommended follow up colonoscopy 01/2014.  Bowels stable.

## 2013-04-13 NOTE — Assessment & Plan Note (Signed)
Low cholesterol diet, exercise and weight loss.  Follow lipid panel.  

## 2013-04-24 ENCOUNTER — Ambulatory Visit (INDEPENDENT_AMBULATORY_CARE_PROVIDER_SITE_OTHER): Payer: Medicare Other | Admitting: *Deleted

## 2013-04-24 ENCOUNTER — Other Ambulatory Visit (INDEPENDENT_AMBULATORY_CARE_PROVIDER_SITE_OTHER): Payer: 59

## 2013-04-24 ENCOUNTER — Telehealth: Payer: Self-pay | Admitting: Internal Medicine

## 2013-04-24 DIAGNOSIS — E039 Hypothyroidism, unspecified: Secondary | ICD-10-CM

## 2013-04-24 DIAGNOSIS — L989 Disorder of the skin and subcutaneous tissue, unspecified: Secondary | ICD-10-CM

## 2013-04-24 DIAGNOSIS — E559 Vitamin D deficiency, unspecified: Secondary | ICD-10-CM

## 2013-04-24 DIAGNOSIS — K219 Gastro-esophageal reflux disease without esophagitis: Secondary | ICD-10-CM

## 2013-04-24 DIAGNOSIS — E7800 Pure hypercholesterolemia, unspecified: Secondary | ICD-10-CM

## 2013-04-24 DIAGNOSIS — Z23 Encounter for immunization: Secondary | ICD-10-CM

## 2013-04-24 LAB — COMPREHENSIVE METABOLIC PANEL
ALT: 12 U/L (ref 0–35)
AST: 19 U/L (ref 0–37)
Albumin: 3.4 g/dL — ABNORMAL LOW (ref 3.5–5.2)
Alkaline Phosphatase: 67 U/L (ref 39–117)
BILIRUBIN TOTAL: 0.8 mg/dL (ref 0.3–1.2)
BUN: 18 mg/dL (ref 6–23)
CO2: 32 mEq/L (ref 19–32)
CREATININE: 0.7 mg/dL (ref 0.4–1.2)
Calcium: 9 mg/dL (ref 8.4–10.5)
Chloride: 100 mEq/L (ref 96–112)
GFR: 81.89 mL/min (ref >60.00)
Glucose, Bld: 88 mg/dL (ref 70–99)
Potassium: 4.2 mEq/L (ref 3.5–5.1)
Sodium: 138 mEq/L (ref 135–145)
Total Protein: 6.6 g/dL (ref 6.0–8.3)

## 2013-04-24 LAB — LIPID PANEL
CHOL/HDL RATIO: 4
Cholesterol: 213 mg/dL — ABNORMAL HIGH (ref 0–200)
HDL: 54.6 mg/dL (ref >39.00)
LDL Cholesterol: 148 mg/dL — ABNORMAL HIGH (ref 0–99)
Triglycerides: 53 mg/dL (ref 0.0–149.0)
VLDL: 10.6 mg/dL (ref 0.0–40.0)

## 2013-04-24 LAB — CBC WITH DIFFERENTIAL/PLATELET
Basophils Absolute: 0 10*3/uL (ref 0.0–0.1)
Basophils Relative: 1 % (ref 0.0–3.0)
EOS ABS: 0.2 10*3/uL (ref 0.0–0.7)
Eosinophils Relative: 3.2 % (ref 0.0–5.0)
HCT: 39.4 % (ref 36.0–46.0)
HEMOGLOBIN: 12.8 g/dL (ref 12.0–15.0)
Lymphocytes Relative: 24.9 % (ref 12.0–46.0)
Lymphs Abs: 1.2 10*3/uL (ref 0.7–4.0)
MCHC: 32.5 g/dL (ref 30.0–36.0)
MCV: 88.1 fl (ref 78.0–100.0)
Monocytes Absolute: 0.3 10*3/uL (ref 0.1–1.0)
Monocytes Relative: 6.4 % (ref 3.0–12.0)
NEUTROS ABS: 3.2 10*3/uL (ref 1.4–7.7)
Neutrophils Relative %: 64.5 % (ref 43.0–77.0)
Platelets: 230 10*3/uL (ref 150.0–400.0)
RBC: 4.47 Mil/uL (ref 3.87–5.11)
RDW: 14.7 % — AB (ref 11.5–14.6)
WBC: 4.9 10*3/uL (ref 4.5–10.5)

## 2013-04-24 LAB — TSH: TSH: 2.17 u[IU]/mL (ref 0.35–5.50)

## 2013-04-24 NOTE — Telephone Encounter (Signed)
Pt came in for labs and injection today.  Pt states at her appt 4/2 she was told she would be referred to Dermatology but has not heard back regarding an appointment.  Pt states she is willing to go anywhere.  On further investigation after pt had left the office it was noted that the referral was not placed.

## 2013-04-24 NOTE — Telephone Encounter (Signed)
Dermatology referral placed

## 2013-04-25 ENCOUNTER — Encounter: Payer: Self-pay | Admitting: Emergency Medicine

## 2013-04-25 LAB — VITAMIN D 25 HYDROXY (VIT D DEFICIENCY, FRACTURES): Vit D, 25-Hydroxy: 49 ng/mL (ref 30–89)

## 2013-04-27 ENCOUNTER — Other Ambulatory Visit: Payer: Self-pay | Admitting: Internal Medicine

## 2013-04-27 DIAGNOSIS — E7800 Pure hypercholesterolemia, unspecified: Secondary | ICD-10-CM

## 2013-04-27 NOTE — Progress Notes (Signed)
Order placed for f/u liver panel.  

## 2013-04-29 ENCOUNTER — Other Ambulatory Visit: Payer: Self-pay | Admitting: Internal Medicine

## 2013-04-29 DIAGNOSIS — E7800 Pure hypercholesterolemia, unspecified: Secondary | ICD-10-CM

## 2013-04-29 DIAGNOSIS — K219 Gastro-esophageal reflux disease without esophagitis: Secondary | ICD-10-CM

## 2013-04-29 NOTE — Progress Notes (Signed)
Order placed for f/u labs.  

## 2013-06-23 ENCOUNTER — Other Ambulatory Visit: Payer: Self-pay | Admitting: *Deleted

## 2013-06-23 MED ORDER — FLUTICASONE PROPIONATE 50 MCG/ACT NA SUSP: 2.0000 | Freq: Every day | NASAL | Status: DC | PRN

## 2013-09-22 ENCOUNTER — Encounter: Payer: Self-pay | Admitting: Adult Health

## 2013-09-22 ENCOUNTER — Ambulatory Visit (INDEPENDENT_AMBULATORY_CARE_PROVIDER_SITE_OTHER): Payer: Medicare Other | Admitting: Adult Health

## 2013-09-22 DIAGNOSIS — M25559 Pain in unspecified hip: Secondary | ICD-10-CM

## 2013-09-22 DIAGNOSIS — M25552 Pain in left hip: Secondary | ICD-10-CM | POA: Insufficient documentation

## 2013-09-22 DIAGNOSIS — Z0000 Encounter for general adult medical examination without abnormal findings: Secondary | ICD-10-CM

## 2013-09-22 NOTE — Progress Notes (Signed)
Pre visit review using our clinic review tool, if applicable. No additional management support is needed unless otherwise documented below in the visit note. 

## 2013-09-22 NOTE — Progress Notes (Signed)
Patient ID: Jodi Harris, female   DOB: 01/24/1940, 73 y.o.   MRN: [id number]   Subjective:    Patient ID: Jodi Harris, female    DOB: [birth year], 73 y.o.   MRN: [id number]  HPI  Pt is a 73 y/o female who presents to clinic for her Medicare Wellness and for a concern about pain after falling. She fell late December when she was moving things around in her home. She fell on her left side. Developed a hematoma on the left hip. She has been experiencing pain when lying on the left hip as well as pain in the right posterior hip. She did not seek any medical attention. In the morning she feels very stiff. She does not have any radiating pain. She takes Aleve which she reports takes the edge off. She does not take this regularly. Pt sees a chiropractor occasionally.  Past Medical History  Diagnosis Date  . Hypothyroidism   . Hypercholesterolemia   . GERD (gastroesophageal reflux disease)   . Anemia   . Thrombophlebitis     varicosities    Current Outpatient Prescriptions on File Prior to Visit  Medication Sig Dispense Refill  . calcium carbonate (TUMS EX) 750 MG chewable tablet Chew 1 tablet by mouth as needed.       . Cholecalciferol (VITAMIN D-3 PO) Take 2,000 Units by mouth daily.      Marland Kitchen esomeprazole (NEXIUM) 20 MG capsule Take 20 mg by mouth daily.      . fluticasone (FLONASE) 50 MCG/ACT nasal spray Place 2 sprays into both nostrils daily as needed.  16 g  5  . levothyroxine (SYNTHROID, LEVOTHROID) 125 MCG tablet Take 1 tablet (125 mcg total) by mouth daily.  90 tablet  3  . Multiple Vitamins-Minerals (MULTIVITAMIN PO) Take by mouth.      . Naproxen Sodium (ALEVE PO) Take by mouth as needed.       . triamcinolone cream (KENALOG) 0.1 % Apply 1 application topically 2 (two) times daily.  30 g  0   No current facility-administered medications on file prior to visit.     Review of Systems  Gastrointestinal:       No loss of bowel function  Genitourinary:       No loss of bladder  function  Musculoskeletal: Positive for back pain.       Left hip pain when lying on that side  Neurological: Negative for weakness and numbness.  All other systems reviewed and are negative.      Objective:  LMP 02/01/1993   Physical Exam  Constitutional: She is oriented to person, place, and time. No distress.  Cardiovascular: Normal rate and regular rhythm.   Pulmonary/Chest: Effort normal. No respiratory distress.  Musculoskeletal: Normal range of motion. She exhibits no tenderness.  No point tenderness on back  Neurological: She is alert and oriented to person, place, and time.  Skin: Skin is warm and dry.  Psychiatric: She has a normal mood and affect. Her behavior is normal. Judgment and thought content normal.       Assessment & Plan:   1. Left hip pain Following a fall in late December. Did not seek any medical attention. No problems with ambulation or gait. Pain when lying on the left side. Pain also in posterior right hip. Will get plain films to rule out bony abnormality.  Subjective:    Jodi Harris is a 73 y.o. female who presents for Medicare Annual/Subsequent preventive examination.  Preventive  Screening-Counseling & Management  Tobacco History  Smoking status  . Former Smoker  Smokeless tobacco  . Never Used    Comment: smoked as a teenager     Problems Prior to Visit 1.   Current Problems (verified) Patient Active Problem List   Diagnosis Date Noted  . Left hip pain 09/22/2013  . Skin lesions 04/13/2013  . Hypothyroidism 02/04/2012  . History of colon polyps 02/04/2012  . GERD (gastroesophageal reflux disease) 02/04/2012  . Hypercholesterolemia 02/04/2012  . Vitamin D deficiency 02/04/2012    Medications Prior to Visit Current Outpatient Prescriptions on File Prior to Visit  Medication Sig Dispense Refill  . calcium carbonate (TUMS EX) 750 MG chewable tablet Chew 1 tablet by mouth as needed.       . Cholecalciferol (VITAMIN D-3 PO) Take  2,000 Units by mouth daily.      Marland Kitchen esomeprazole (NEXIUM) 20 MG capsule Take 20 mg by mouth daily.      . fluticasone (FLONASE) 50 MCG/ACT nasal spray Place 2 sprays into both nostrils daily as needed.  16 g  5  . levothyroxine (SYNTHROID, LEVOTHROID) 125 MCG tablet Take 1 tablet (125 mcg total) by mouth daily.  90 tablet  3  . Multiple Vitamins-Minerals (MULTIVITAMIN PO) Take by mouth.      . Naproxen Sodium (ALEVE PO) Take by mouth as needed.       . triamcinolone cream (KENALOG) 0.1 % Apply 1 application topically 2 (two) times daily.  30 g  0   No current facility-administered medications on file prior to visit.    Current Medications (verified) Current Outpatient Prescriptions  Medication Sig Dispense Refill  . calcium carbonate (TUMS EX) 750 MG chewable tablet Chew 1 tablet by mouth as needed.       . Cholecalciferol (VITAMIN D-3 PO) Take 2,000 Units by mouth daily.      Marland Kitchen esomeprazole (NEXIUM) 20 MG capsule Take 20 mg by mouth daily.      Marland Kitchen FLUoxetine (PROZAC) 10 MG capsule Take 10 mg by mouth as needed.      . fluticasone (FLONASE) 50 MCG/ACT nasal spray Place 2 sprays into both nostrils daily as needed.  16 g  5  . levothyroxine (SYNTHROID, LEVOTHROID) 125 MCG tablet Take 1 tablet (125 mcg total) by mouth daily.  90 tablet  3  . Multiple Vitamins-Minerals (MULTIVITAMIN PO) Take by mouth.      . Naproxen Sodium (ALEVE PO) Take by mouth as needed.       . Probiotic Product (ALIGN PO) Take 1 capsule by mouth daily.      Marland Kitchen triamcinolone cream (KENALOG) 0.1 % Apply 1 application topically 2 (two) times daily.  30 g  0   No current facility-administered medications for this visit.     Allergies (verified) Codeine sulfate and Penicillins   PAST HISTORY  Family History Family History  Problem Relation Age of Onset  . Cancer Father     lymphoma  . Cancer Maternal Aunt     Breast  . Colon cancer      one relative on maternal side    Social History History  Substance Use  Topics  . Smoking status: Former Games developer  . Smokeless tobacco: Never Used     Comment: smoked as a teenager  . Alcohol Use: Yes     Comment: has a glass of wine about once a month     Are there smokers in your home (other than you)? No  Risk  Factors Current exercise habits: The patient does not participate in regular exercise at present.  Dietary issues discussed: Varies. Some days she does well and other days she does not do as well with eating healthy meals. She has gained some weight.    Cardiac risk factors: advanced age (older than 35 for men, 34 for women), obesity (BMI >= 30 kg/m2) and sedentary lifestyle.  Depression Screen (Note: if answer to either of the following is "Yes", a more complete depression screening is indicated)   Over the past two weeks, have you felt down, depressed or hopeless? No  Over the past two weeks, have you felt little interest or pleasure in doing things? No  Have you lost interest or pleasure in daily life? No  Do you often feel hopeless? No  Do you cry easily over simple problems? No  Activities of Daily Living In your present state of health, do you have any difficulty performing the following activities?:  Driving? No Managing money?  No Feeding yourself? No Getting from bed to chair? NoBack: symmetric, no curvature. ROM normal. No CVA tenderness., Pt s/p fall and c/o pain in left hip and posterior right hip. No point tenderness Climbing a flight of stairs? Yes Preparing food and eating?: No Bathing or showering? No Getting dressed: No Getting to the toilet? No Using the toilet:No Moving around from place to place: No In the past year have you fallen or had a near fall?:Yes   Are you sexually active?  No  Do you have more than one partner?  No  Hearing Difficulties: No Do you often ask people to speak up or repeat themselves? No Do you experience ringing or noises in your ears? No Do you have difficulty understanding soft or whispered  voices? No   Do you feel that you have a problem with memory? No  Do you often misplace items? No  Do you feel safe at home?  Yes  Cognitive Testing  Alert? Yes  Normal Appearance?Yes  Oriented to person? Yes  Place? Yes   Time? Yes  Recall of three objects?  Yes  Can perform simple calculations? Yes  Displays appropriate judgment?Yes  Can read the correct time from a watch face?Yes   Advanced Directives have been discussed with the patient? Yes  List the Names of Other Physician/Practitioners you currently use: 1.    Indicate any recent Medical Services you may have received from other than [facility] providers in the past year (date may be approximate).  Immunization History  Administered Date(s) Administered  . Influenza Split 12/03/2011, 11/12/2012  . Pneumococcal Conjugate-13 04/24/2013  . Pneumococcal Polysaccharide-23 02/02/2012  . Tdap 03/10/2010  . Zoster 07/01/2008    Screening Tests Health Maintenance  Topic Date Due  . Influenza Vaccine  08/09/2013  . Mammogram  03/12/2014  . Tetanus/tdap  03/09/2020  . Colonoscopy  01/25/2021  . Pneumococcal Polysaccharide Vaccine Age 8 And Over  Completed  . Zostavax  Addressed    All answers were reviewed with the patient and necessary referrals were made:  Judi Jaffe, NP   09/22/2013   History reviewed: allergies, current medications, past family history, past medical history, past social history, past surgical history and problem list  Review of Systems A comprehensive review of systems was negative except for: Musculoskeletal: positive for left hip, posterior right hip pain    Objective:     Vision by Snellen chart: right eye:20/40, left eye:20/40  There is no weight on file to calculate BMI. LMP  02/01/1993  Back: no kyphosis present, no tenderness to percussion or palpation, range of motion normal, symmetric, no curvature. ROM normal. No CVA tenderness.     Assessment:      This is a routine wellness   examination for this patient . I reviewed all health maintenance protocols including mammography, colonoscopy, bone density Needed referrals were placed. Age and diagnosis  appropriate screening labs were ordered. Her immunization history was reviewed and appropriate vaccinations were ordered. Her current medications and allergies were reviewed and needed refills of her chronic medications were ordered. The plan for yearly health maintenance was discussed all orders and referrals were made as appropriate.       Plan:     During the course of the visit the patient was educated and counseled about appropriate screening and preventive services including:    Pneumococcal vaccine   Influenza vaccine  Td vaccine  Screening mammography  Bone densitometry screening  Colorectal cancer screening  Advanced directives: has an advanced directive - a copy HAS NOT been provided.  Up to date on Pneumonia, Td vaccine. She wishes to postpone the Flu vaccine until November when she always gets it. Mammogram is up to date. Bone density will be ordered. Up to date on colonoscopy.  Diet review for nutrition referral? Yes ____  Not Indicated ____   Patient Instructions (the written plan) was given to the patient.  Medicare Attestation I have personally reviewed: The patient's medical and social history Their use of alcohol, tobacco or illicit drugs Their current medications and supplements The patient's functional ability including ADLs,fall risks, home safety risks, cognitive, and hearing and visual impairment Diet and physical activities Evidence for depression or mood disorders  The patient's weight, height, BMI, and visual acuity have been recorded in the chart.  I have made referrals, counseling, and provided education to the patient based on review of the above and I have provided the patient with a written personalized care plan for preventive services.     Antoinne Spadaccini, NP   09/22/2013

## 2013-09-22 NOTE — Patient Instructions (Signed)
  You had your Medicare Wellness Visit today.  I am ordering a Bone Density to evaluate for osteoporosis.  [facility] go [facility] to have your hip x[facility] done  Your vaccines are up to date.  You said you would like to get your Flu Vaccine in November as you usually do.  Follow up with Dr. Lorin Picket in 1 week with labs done a few days prior.

## 2013-09-23 NOTE — Progress Notes (Signed)
Has Ms. Mordecai had her dexa scan ordered yet? Thanks

## 2013-09-25 ENCOUNTER — Ambulatory Visit (INDEPENDENT_AMBULATORY_CARE_PROVIDER_SITE_OTHER)
Admission: RE | Admit: 2013-09-25 | Discharge: 2013-09-25 | Disposition: A | Discharge status: Home / Self Care | Payer: Medicare Other | Source: Ambulatory Visit | Attending: Adult Health | Admitting: Adult Health

## 2013-09-25 DIAGNOSIS — M25552 Pain in left hip: Secondary | ICD-10-CM

## 2013-09-25 DIAGNOSIS — M25559 Pain in unspecified hip: Secondary | ICD-10-CM

## 2013-09-29 ENCOUNTER — Other Ambulatory Visit (INDEPENDENT_AMBULATORY_CARE_PROVIDER_SITE_OTHER): Payer: Medicare Other

## 2013-09-29 DIAGNOSIS — E7800 Pure hypercholesterolemia, unspecified: Secondary | ICD-10-CM

## 2013-09-29 DIAGNOSIS — K219 Gastro-esophageal reflux disease without esophagitis: Secondary | ICD-10-CM

## 2013-09-29 LAB — HEPATIC FUNCTION PANEL
ALT: 15 U/L (ref 0–35)
AST: 20 U/L (ref 0–37)
Albumin: 3.5 g/dL (ref 3.5–5.2)
Alkaline Phosphatase: 75 U/L (ref 39–117)
BILIRUBIN DIRECT: 0.1 mg/dL (ref 0.0–0.3)
TOTAL PROTEIN: 6.9 g/dL (ref 6.0–8.3)
Total Bilirubin: 0.5 mg/dL (ref 0.2–1.2)

## 2013-09-29 LAB — BASIC METABOLIC PANEL
BUN: 13 mg/dL (ref 6–23)
CALCIUM: 9.1 mg/dL (ref 8.4–10.5)
CO2: 35 mEq/L — ABNORMAL HIGH (ref 19–32)
CREATININE: 0.8 mg/dL (ref 0.4–1.2)
Chloride: 104 mEq/L (ref 96–112)
GFR: 73.69 mL/min (ref >60.00)
Glucose, Bld: 95 mg/dL (ref 70–99)
Potassium: 4.1 mEq/L (ref 3.5–5.1)
Sodium: 140 mEq/L (ref 135–145)

## 2013-09-29 LAB — LIPID PANEL
CHOL/HDL RATIO: 4
Cholesterol: 204 mg/dL — ABNORMAL HIGH (ref 0–200)
HDL: 48.2 mg/dL (ref >39.00)
LDL Cholesterol: 140 mg/dL — ABNORMAL HIGH (ref 0–99)
NONHDL: 155.8
Triglycerides: 80 mg/dL (ref 0.0–149.0)
VLDL: 16 mg/dL (ref 0.0–40.0)

## 2013-10-02 ENCOUNTER — Encounter: Payer: Self-pay | Admitting: Internal Medicine

## 2013-10-02 ENCOUNTER — Ambulatory Visit (INDEPENDENT_AMBULATORY_CARE_PROVIDER_SITE_OTHER): Payer: Medicare Other | Admitting: Internal Medicine

## 2013-10-02 VITALS — BP 120/70 | HR 64 | Temp 98.1°F | Ht 65.5 in | Wt 259.0 lb

## 2013-10-02 DIAGNOSIS — M25559 Pain in unspecified hip: Secondary | ICD-10-CM

## 2013-10-02 DIAGNOSIS — E669 Obesity, unspecified: Secondary | ICD-10-CM

## 2013-10-02 DIAGNOSIS — Z86010 Personal history of colonic polyps: Secondary | ICD-10-CM

## 2013-10-02 DIAGNOSIS — F439 Reaction to severe stress, unspecified: Secondary | ICD-10-CM

## 2013-10-02 DIAGNOSIS — E039 Hypothyroidism, unspecified: Secondary | ICD-10-CM

## 2013-10-02 DIAGNOSIS — M25552 Pain in left hip: Secondary | ICD-10-CM

## 2013-10-02 DIAGNOSIS — E7800 Pure hypercholesterolemia, unspecified: Secondary | ICD-10-CM

## 2013-10-02 DIAGNOSIS — K219 Gastro-esophageal reflux disease without esophagitis: Secondary | ICD-10-CM

## 2013-10-02 DIAGNOSIS — Z733 Stress, not elsewhere classified: Secondary | ICD-10-CM

## 2013-10-02 DIAGNOSIS — E559 Vitamin D deficiency, unspecified: Secondary | ICD-10-CM

## 2013-10-02 NOTE — Progress Notes (Signed)
Pre visit review using our clinic review tool, if applicable. No additional management support is needed unless otherwise documented below in the visit note. 

## 2013-10-03 ENCOUNTER — Encounter: Payer: Self-pay | Admitting: Internal Medicine

## 2013-10-03 ENCOUNTER — Other Ambulatory Visit: Payer: Self-pay | Admitting: Internal Medicine

## 2013-10-03 DIAGNOSIS — K219 Gastro-esophageal reflux disease without esophagitis: Secondary | ICD-10-CM

## 2013-10-03 DIAGNOSIS — Z6841 Body Mass Index (BMI) 40.0 and over, adult: Secondary | ICD-10-CM

## 2013-10-03 DIAGNOSIS — F439 Reaction to severe stress, unspecified: Secondary | ICD-10-CM | POA: Insufficient documentation

## 2013-10-03 DIAGNOSIS — Z1211 Encounter for screening for malignant neoplasm of colon: Secondary | ICD-10-CM

## 2013-10-03 NOTE — Assessment & Plan Note (Signed)
Increased stress.  Off prozac.  Discussed other treatment options.  She had questions about prn medications.  Discussed buspar.  She desires to hold at this point.  Will call or f/u if problems.

## 2013-10-03 NOTE — Assessment & Plan Note (Addendum)
EGD 01/16/11 revealed multiple gastric polyps and a hiatal hernia.  Tolerates nexium  q day.  Still notices some reflux.  Takes TUMS three times per week.  Has been a while since has had EGD.  Given breakthrough symptoms despite medications, will refer back to GI for further evaluation with question of need for EGD to be schedule with her colonoscopy.

## 2013-10-03 NOTE — Assessment & Plan Note (Addendum)
Takes alleve prn.  Desires no further w/up or evaluation at this point.  Follow.  Xray revealed mild degenerative changes of the hip.

## 2013-10-03 NOTE — Progress Notes (Signed)
Order placed for GI referral.   

## 2013-10-03 NOTE — Assessment & Plan Note (Signed)
Low cholesterol diet, exercise and weight loss.  Follow lipid panel.  Discussed diet and exercise with her today. She declines cholesterol medication.

## 2013-10-03 NOTE — Progress Notes (Signed)
Subjective:    Patient ID: Jodi Harris, female    DOB: 07-Apr-1940, 73 y.o.   MRN: [id number]  HPI 73 year old female with past history of hypothyroidism and gastric surgery (lap band) who comes in today for a scheduled follow up.  Breathing stable.   No urine change or bowel change.  She is trying to watch what she eats.  Started eating oatmeal.  Cholesterol still higher than goal.  Declines medication.  Off prozac.  Feels she is doing relatively well off the prozac.  Had questions about just taking something as needed.  We discussed treatment options.  Discussed the possibility of buspar.   No light headedness or dizziness.  No headache.  Overall she feels she is doing well.   Blood pressure doing well.  Does report some right hip discomfort.  Does not limit her activity.  Takes an alleve prn.  Tolerating.     Past Medical History  Diagnosis Date  . Hypothyroidism   . Hypercholesterolemia   . GERD (gastroesophageal reflux disease)   . Anemia   . Thrombophlebitis     varicosities    Current Outpatient Prescriptions on File Prior to Visit  Medication Sig Dispense Refill  . calcium carbonate (TUMS EX) 750 MG chewable tablet Chew 1 tablet by mouth as needed.       . Cholecalciferol (VITAMIN D-3 PO) Take 2,000 Units by mouth daily.      Marland Kitchen esomeprazole (NEXIUM) 20 MG capsule Take 20 mg by mouth daily.      Marland Kitchen FLUoxetine (PROZAC) 10 MG capsule Take 10 mg by mouth as needed.      . fluticasone (FLONASE) 50 MCG/ACT nasal spray Place 2 sprays into both nostrils daily as needed.  16 g  5  . levothyroxine (SYNTHROID, LEVOTHROID) 125 MCG tablet Take 1 tablet (125 mcg total) by mouth daily.  90 tablet  3  . Multiple Vitamins-Minerals (MULTIVITAMIN PO) Take by mouth.      . Naproxen Sodium (ALEVE PO) Take by mouth as needed.       . Probiotic Product (ALIGN PO) Take 1 capsule by mouth daily.      Marland Kitchen triamcinolone cream (KENALOG) 0.1 % Apply 1 application topically 2 (two) times daily.  30 g  0   No  current facility-administered medications on file prior to visit.    Review of Systems Patient denies any headache.  No light headedness or dizziness.  No chest pain, tightness or palpitations.  No increased shortness of breath, cough or congestion.  No nausea or vomiting.  Still some acid reflux occasionally despite taking daily ppi.  Has to take TUMs 3x/week.  Discussed avoiding foods that aggravate.   No abdominal pain or cramping.  No bowel change, such as diarrhea, constipation, BRBPR or melana.  No urine change.  Off prozac.  See above.  Overall she feels she is doing well.  Discussed diet and exercise.  Right hip discomfort as outlined.       Objective:   Physical Exam  Filed Vitals:   10/02/13 1108  BP: 120/70  Pulse: 64  Temp: 98.1 F (36.7 C)   Blood pressure recheck:  99/46  73 year old female in no acute distress.   HEENT:  Nares- clear.  Oropharynx - without lesions.  NECK:  Supple.  Nontender.  No audible bruit.  HEART:  Appears to be regular. LUNGS:  No crackles or wheezing audible.  Respirations even and unlabored.  RADIAL PULSE:  Equal  bilaterally.   ABDOMEN:  Soft, nontender.  Bowel sounds present and normal.  No audible abdominal bruit.   EXTREMITIES:  No increased edema present.  DP pulses palpable and equal bilaterally.           Assessment & Plan:  GYN.  Has been followed at [facility].  States up to date.     LIGHT HEADEDNESS.  Resolved.   HEALTH MAINTENANCE.  Breast, pelvic and paps at gyn.  Colonoscopy as outlined.  Mammogram 03/03/13 recommended f/u views.  F/u views 03/11/13  - Birads II.  I spent 25 minutes with the patient and more than 50% of the time was spent in consultation regarding the above.

## 2013-10-03 NOTE — Assessment & Plan Note (Signed)
Discussed diet and exercise.  Follow.  

## 2013-10-03 NOTE — Assessment & Plan Note (Signed)
Continue synthroid. Follow tsh.  

## 2013-10-03 NOTE — Assessment & Plan Note (Signed)
Taking her vitamin d supplements.  Follow vitamin D level.

## 2013-10-03 NOTE — Assessment & Plan Note (Signed)
S/p laparoscopic assisted partial colectomy for a tubular adenomatous polyp.  Follow up colonoscopy 01/18/09 revealed diverticulosis, a 3mm polyp in the transverse colon and non bleeding internal hemorrhoids (abnormal appearing appendicial orifice.  Subsequently had a CT scan and found to have a redundant colon.  Repeat colonoscopy 1/13 - diverticulosis and one polyp.  Recommended follow up colonoscopy 01/2014.  Bowels stable.

## 2014-03-05 ENCOUNTER — Ambulatory Visit: Payer: Self-pay | Admitting: Internal Medicine

## 2014-03-05 LAB — HM MAMMOGRAPHY: HM MAMMO: NEGATIVE

## 2014-03-06 ENCOUNTER — Encounter: Payer: Self-pay | Admitting: Internal Medicine

## 2014-03-12 ENCOUNTER — Telehealth: Payer: Self-pay | Admitting: Internal Medicine

## 2014-03-12 NOTE — Telephone Encounter (Signed)
Patient has concerns regarding her colonoscopy.

## 2014-03-12 NOTE — Telephone Encounter (Signed)
Spoke to CaryRhonda.  Explained pts symptoms.  Printed off my note from 09/2013.  In your box.  She requested that to be faxed.  Please fax to [phone].  Thanks.

## 2014-03-12 NOTE — Telephone Encounter (Signed)
Records faxed to Dr. Marva PandaSkulskie office & pt updated on our progress.

## 2014-03-12 NOTE — Telephone Encounter (Signed)
Pt is scheduled for colonscopy on 3/22 (Dr. Marva PandaSkulskie). Pt states that you wanted her to have an endoscopy at the same time but he didn't see any note on that. Please advise. Pt states you mentioned it due to her heartburn.

## 2014-03-26 ENCOUNTER — Ambulatory Visit: Payer: Self-pay | Admitting: Gastroenterology

## 2014-03-26 ENCOUNTER — Other Ambulatory Visit (INDEPENDENT_AMBULATORY_CARE_PROVIDER_SITE_OTHER): Payer: Medicare Other

## 2014-03-26 DIAGNOSIS — E039 Hypothyroidism, unspecified: Secondary | ICD-10-CM | POA: Diagnosis not present

## 2014-03-26 DIAGNOSIS — E7800 Pure hypercholesterolemia, unspecified: Secondary | ICD-10-CM

## 2014-03-26 DIAGNOSIS — E559 Vitamin D deficiency, unspecified: Secondary | ICD-10-CM

## 2014-03-26 LAB — CBC WITH DIFFERENTIAL/PLATELET
BASOS PCT: 0.7 % (ref 0.0–3.0)
Basophils Absolute: 0 10*3/uL (ref 0.0–0.1)
Eosinophils Absolute: 0.1 10*3/uL (ref 0.0–0.7)
Eosinophils Relative: 1.8 % (ref 0.0–5.0)
HEMATOCRIT: 39.3 % (ref 36.0–46.0)
HEMOGLOBIN: 12.8 g/dL (ref 12.0–15.0)
LYMPHS ABS: 1.3 10*3/uL (ref 0.7–4.0)
LYMPHS PCT: 27.3 % (ref 12.0–46.0)
MCHC: 32.6 g/dL (ref 30.0–36.0)
MCV: 87.1 fl (ref 78.0–100.0)
Monocytes Absolute: 0.4 10*3/uL (ref 0.1–1.0)
Monocytes Relative: 7.1 % (ref 3.0–12.0)
NEUTROS ABS: 3.1 10*3/uL (ref 1.4–7.7)
NEUTROS PCT: 63.1 % (ref 43.0–77.0)
Platelets: 229 10*3/uL (ref 150.0–400.0)
RBC: 4.51 Mil/uL (ref 3.87–5.11)
RDW: 14.6 % (ref 11.5–15.5)
WBC: 4.9 10*3/uL (ref 4.0–10.5)

## 2014-03-26 LAB — COMPREHENSIVE METABOLIC PANEL
ALT: 13 U/L (ref 0–35)
AST: 21 U/L (ref 0–37)
Albumin: 3.8 g/dL (ref 3.5–5.2)
Alkaline Phosphatase: 77 U/L (ref 39–117)
BUN: 15 mg/dL (ref 6–23)
CO2: 32 meq/L (ref 19–32)
CREATININE: 0.77 mg/dL (ref 0.40–1.20)
Calcium: 9.2 mg/dL (ref 8.4–10.5)
Chloride: 102 mEq/L (ref 96–112)
GFR: 78.02 mL/min (ref >60.00)
Glucose, Bld: 88 mg/dL (ref 70–99)
POTASSIUM: 4.3 meq/L (ref 3.5–5.1)
SODIUM: 140 meq/L (ref 135–145)
TOTAL PROTEIN: 6.8 g/dL (ref 6.0–8.3)
Total Bilirubin: 0.5 mg/dL (ref 0.2–1.2)

## 2014-03-26 LAB — LIPID PANEL
CHOL/HDL RATIO: 4
CHOLESTEROL: 201 mg/dL — AB (ref 0–200)
HDL: 55.3 mg/dL (ref >39.00)
LDL CALC: 131 mg/dL — AB (ref 0–99)
NonHDL: 145.7
Triglycerides: 75 mg/dL (ref 0.0–149.0)
VLDL: 15 mg/dL (ref 0.0–40.0)

## 2014-03-26 LAB — TSH: TSH: 1.8 u[IU]/mL (ref 0.35–4.50)

## 2014-03-26 LAB — VITAMIN D 25 HYDROXY (VIT D DEFICIENCY, FRACTURES): VITD: 43.84 ng/mL (ref 30.00–100.00)

## 2014-03-27 ENCOUNTER — Other Ambulatory Visit: Payer: Self-pay

## 2014-03-30 ENCOUNTER — Other Ambulatory Visit: Payer: Medicare Other

## 2014-03-31 ENCOUNTER — Ambulatory Visit: Payer: Self-pay | Admitting: Gastroenterology

## 2014-03-31 LAB — HM COLONOSCOPY

## 2014-04-02 ENCOUNTER — Encounter: Payer: Self-pay | Admitting: Internal Medicine

## 2014-04-02 ENCOUNTER — Ambulatory Visit (INDEPENDENT_AMBULATORY_CARE_PROVIDER_SITE_OTHER): Payer: Medicare Other | Admitting: Internal Medicine

## 2014-04-02 VITALS — BP 118/58 | HR 64 | Temp 98.0°F | Ht 65.5 in | Wt 266.5 lb

## 2014-04-02 DIAGNOSIS — E7800 Pure hypercholesterolemia, unspecified: Secondary | ICD-10-CM

## 2014-04-02 DIAGNOSIS — Z86010 Personal history of colonic polyps: Secondary | ICD-10-CM

## 2014-04-02 DIAGNOSIS — K219 Gastro-esophageal reflux disease without esophagitis: Secondary | ICD-10-CM | POA: Diagnosis not present

## 2014-04-02 DIAGNOSIS — E559 Vitamin D deficiency, unspecified: Secondary | ICD-10-CM

## 2014-04-02 DIAGNOSIS — E039 Hypothyroidism, unspecified: Secondary | ICD-10-CM | POA: Diagnosis not present

## 2014-04-02 DIAGNOSIS — E669 Obesity, unspecified: Secondary | ICD-10-CM

## 2014-04-02 DIAGNOSIS — Z0000 Encounter for general adult medical examination without abnormal findings: Secondary | ICD-10-CM

## 2014-04-02 NOTE — Progress Notes (Signed)
Patient ID: Jodi Harris, female   DOB: June 27, 1940, 74 y.o.   MRN: [id number]   Subjective:    Patient ID: Jodi Harris, female    DOB: 12-17-1940, 74 y.o.   MRN: [id number]  HPI  Patient here for a scheduled follow up.  States she is doing well.  No chest pain or tightness.  Breathing stable.  Had UGI recently.  Hiatal hernia.  Planning for colonoscopy next week.  Request referral to Dr Smitty Cords - bariatric surgeon.  Bowels stable.  Breathing stable.     Past Medical History  Diagnosis Date  . Hypothyroidism   . Hypercholesterolemia   . GERD (gastroesophageal reflux disease)   . Anemia   . Thrombophlebitis     varicosities    Current Outpatient Prescriptions on File Prior to Visit  Medication Sig Dispense Refill  . calcium carbonate (TUMS EX) 750 MG chewable tablet Chew 1 tablet by mouth as needed.     . Cholecalciferol (VITAMIN D-3 PO) Take 2,000 Units by mouth daily.    Marland Kitchen esomeprazole (NEXIUM) 20 MG capsule Take 20 mg by mouth daily.    . fluticasone (FLONASE) 50 MCG/ACT nasal spray Place 2 sprays into both nostrils daily as needed. 16 g 5  . levothyroxine (SYNTHROID, LEVOTHROID) 125 MCG tablet Take 1 tablet (125 mcg total) by mouth daily. 90 tablet 3  . Multiple Vitamins-Minerals (MULTIVITAMIN PO) Take by mouth.    . Naproxen Sodium (ALEVE PO) Take by mouth as needed.     . Probiotic Product (ALIGN PO) Take 1 capsule by mouth daily.     No current facility-administered medications on file prior to visit.    Review of Systems  Constitutional: Negative for appetite change and unexpected weight change.  HENT: Negative for congestion and sinus pressure.   Respiratory: Negative for cough, chest tightness and shortness of breath.   Cardiovascular: Negative for chest pain, palpitations and leg swelling.  Gastrointestinal: Negative for nausea, vomiting, abdominal pain and diarrhea.  Genitourinary: Negative for dysuria and difficulty urinating.  Musculoskeletal: Negative  for joint swelling.  Skin: Negative for color change and rash.  Neurological: Negative for dizziness, light-headedness and headaches.  Hematological: Negative for adenopathy. Does not bruise/bleed easily.  Psychiatric/Behavioral: Negative for dysphoric mood and agitation.       Objective:     Blood pressure recheck:  138/78  Physical Exam  Constitutional: She appears well-developed and well-nourished. No distress.  HENT:  Nose: Nose normal.  Mouth/Throat: Oropharynx is clear and moist.  Neck: Neck supple. No thyromegaly present.  Cardiovascular: Normal rate and regular rhythm.   Pulmonary/Chest: Breath sounds normal. No respiratory distress. She has no wheezes.  Abdominal: Soft. Bowel sounds are normal. There is no tenderness.  Musculoskeletal: She exhibits no edema or tenderness.  Lymphadenopathy:    She has no cervical adenopathy.  Skin: No rash noted. No erythema.  Psychiatric: She has a normal mood and affect. Her behavior is normal.    BP 118/58 mmHg  Pulse 64  Temp(Src) 98 F (36.7 C) (Oral)  Ht 5' 5.5" (1.664 m)  Wt 266 lb 8 oz (120.884 kg)  BMI 43.66 kg/m2  SpO2 94%  LMP 02/01/1993 Wt Readings from Last 3 Encounters:  04/02/14 266 lb 8 oz (120.884 kg)  10/02/13 259 lb (117.482 kg)  04/10/13 258 lb 8 oz (117.255 kg)     Lab Results  Component Value Date   WBC 4.9 03/26/2014   HGB 12.8 03/26/2014   HCT 39.3 03/26/2014  PLT 229.0 03/26/2014   GLUCOSE 88 03/26/2014   CHOL 201* 03/26/2014   TRIG 75.0 03/26/2014   HDL 55.30 03/26/2014   LDLDIRECT 129.8 02/15/2012   LDLCALC 131* 03/26/2014   ALT 13 03/26/2014   AST 21 03/26/2014   NA 140 03/26/2014   K 4.3 03/26/2014   CL 102 03/26/2014   CREATININE 0.77 03/26/2014   BUN 15 03/26/2014   CO2 32 03/26/2014   TSH 1.80 03/26/2014       Assessment & Plan:   Problem List Items Addressed This Visit    GERD (gastroesophageal reflux disease) - Primary    Just recently had UGI - hiatal hernia.  On  nexium.         Health care maintenance    Breast, pelvic and pap smears through gyn.  Mammogram 03/05/14 - Birads I.  Colonoscopy planned for next week.        History of colon polyps    S/p lap assisted partial colectomy for a tubular adenomatous polyp.  F/u colonoscopy 01/2009 and 01/2011.  Planning for f/u colonoscopy next week.        Hypercholesterolemia    Low cholesterol diet and exercise.  Follow lipid panel.        Relevant Orders   Comprehensive metabolic panel   Lipid panel   Hypothyroidism    On thyroid replacement.  Follow tsh.        Obesity    Discussed diet and exercise.  She request referral to Dr Smitty CordsBruce for evaluation.  Referral made.        Relevant Orders   Ambulatory referral to General Surgery   Vitamin D deficiency    Recent vitamin D level checked and wnl.  Follow.          I spent 25 minutes with the patient and more than 50% of the time was spent in consultation regarding the above.     Dale DurhamSCOTT, Britzy Graul, MD

## 2014-04-02 NOTE — Progress Notes (Signed)
Pre visit review using our clinic review tool, if applicable. No additional management support is needed unless otherwise documented below in the visit note. 

## 2014-04-06 ENCOUNTER — Encounter: Payer: Self-pay | Admitting: Internal Medicine

## 2014-04-06 DIAGNOSIS — Z0000 Encounter for general adult medical examination without abnormal findings: Secondary | ICD-10-CM | POA: Insufficient documentation

## 2014-04-06 NOTE — Assessment & Plan Note (Signed)
Discussed diet and exercise.  She request referral to Dr Smitty CordsBruce for evaluation.  Referral made.

## 2014-04-06 NOTE — Assessment & Plan Note (Signed)
Recent vitamin D level checked and wnl.  Follow.

## 2014-04-06 NOTE — Assessment & Plan Note (Signed)
Breast, pelvic and pap smears through gyn.  Mammogram 03/05/14 - Birads I.  Colonoscopy planned for next week.

## 2014-04-06 NOTE — Assessment & Plan Note (Signed)
On thyroid replacement.  Follow tsh.  

## 2014-04-06 NOTE — Assessment & Plan Note (Signed)
S/p lap assisted partial colectomy for a tubular adenomatous polyp.  F/u colonoscopy 01/2009 and 01/2011.  Planning for f/u colonoscopy next week.

## 2014-04-06 NOTE — Assessment & Plan Note (Addendum)
Just recently had UGI - hiatal hernia.  On nexium.

## 2014-04-06 NOTE — Assessment & Plan Note (Signed)
Low cholesterol diet and exercise.  Follow lipid panel.   

## 2014-04-08 ENCOUNTER — Encounter: Payer: Self-pay | Admitting: Internal Medicine

## 2014-04-27 ENCOUNTER — Ambulatory Visit (INDEPENDENT_AMBULATORY_CARE_PROVIDER_SITE_OTHER): Payer: Medicare Other | Admitting: Nurse Practitioner

## 2014-04-27 ENCOUNTER — Encounter: Payer: Self-pay | Admitting: Nurse Practitioner

## 2014-04-27 VITALS — BP 110/60 | HR 80 | Temp 98.1°F | Resp 14 | Ht 65.5 in | Wt 259.8 lb

## 2014-04-27 DIAGNOSIS — J209 Acute bronchitis, unspecified: Secondary | ICD-10-CM | POA: Diagnosis not present

## 2014-04-27 MED ORDER — AZITHROMYCIN 250 MG PO TABS: ORAL_TABLET | ORAL | Status: DC

## 2014-04-27 NOTE — Assessment & Plan Note (Signed)
Worsening after 5 days. Will treat with Z-pack and Robitussin cough-chest congestion. Asked her to continue align OTC. Call us if not improved afterward.

## 2014-04-27 NOTE — Patient Instructions (Signed)
Robitussin Cough + Chest congestion will help with cough and help move the mucous.   Take the Z-pack as directed.   Please take a probiotic ( Align, Floraque or Culturelle) while you are on the antibiotic to prevent a serious antibiotic associated diarrhea  Called clostirudium dificile colitis and a vaginal yeast infection.   Call us if not improving.

## 2014-04-27 NOTE — Progress Notes (Signed)
   Subjective:    Patient ID: Jodi Harris, female    DOB: 01/10/1940, 74 y.o.   MRN: [id number]  HPI  Ms. Davidson is a 74 yo female with a  CC of nasal congestion, fatigue, and ear pressure x 5 days.   1) Nasal congestion, fatigue, ear pressure, and cough. Non-productive most of the time. Husband had similar symptoms.   Treatment to date:  Flonase- Somewhat helpful  Saline Nasal Spray- Somewhat helpful  Mucinex DM x 3-4 days- Somewhat helpful  Zyrtec- Somewhat helpful   Review of Systems  Constitutional: Positive for fatigue. Negative for fever, chills and diaphoresis.  HENT: Positive for congestion and ear pain. Negative for ear discharge, sinus pressure, sneezing and sore throat.   Eyes: Negative for visual disturbance.  Respiratory: Positive for cough and wheezing. Negative for chest tightness and shortness of breath.   Cardiovascular: Negative for chest pain, palpitations and leg swelling.  Gastrointestinal: Negative for nausea, vomiting and diarrhea.  Skin: Negative for rash.      Objective:   Physical Exam  Constitutional: She is oriented to person, place, and time. She appears well-developed and well-nourished. No distress.  BP 110/60 mmHg  Pulse 80  Temp(Src) 98.1 F (36.7 C) (Oral)  Resp 14  Ht 5' 5.5" (1.664 m)  Wt 259 lb 12.8 oz (117.845 kg)  BMI 42.56 kg/m2  SpO2 97%  LMP 02/01/1993   HENT:  Head: Normocephalic and atraumatic.  Right Ear: External ear normal.  Left Ear: External ear normal.  Mouth/Throat: No oropharyngeal exudate.  Eyes: Right eye exhibits no discharge. Left eye exhibits no discharge. No scleral icterus.  Neck: Neck supple.  Cardiovascular: Normal rate, regular rhythm and normal heart sounds.  Exam reveals no gallop and no friction rub.   No murmur heard. Pulmonary/Chest: Effort normal and breath sounds normal. No respiratory distress. She has no wheezes. She has no rales. She exhibits no tenderness.  Crackles all over    Lymphadenopathy:    She has no cervical adenopathy.  Neurological: She is alert and oriented to person, place, and time. No cranial nerve deficit. She exhibits normal muscle tone. Coordination normal.  Skin: Skin is warm and dry. No rash noted. She is not diaphoretic.  Psychiatric: She has a normal mood and affect. Her behavior is normal. Judgment and thought content normal.      Assessment & Plan:

## 2014-04-27 NOTE — Progress Notes (Signed)
Pre visit review using our clinic review tool, if applicable. No additional management support is needed unless otherwise documented below in the visit note. 

## 2014-05-04 ENCOUNTER — Telehealth: Payer: Self-pay | Admitting: *Deleted

## 2014-05-04 LAB — SURGICAL PATHOLOGY

## 2014-05-04 NOTE — Telephone Encounter (Signed)
Unable to work in today.  Can she come in tomorrow at 12:00.  Thanks

## 2014-05-04 NOTE — Telephone Encounter (Signed)
Would you like to see her at 1:15 today since you already have a 1:00 scheduled?

## 2014-05-04 NOTE — Telephone Encounter (Signed)
Had a cancellation & put patient on schedule @ 10am. Pt notified.

## 2014-05-05 ENCOUNTER — Ambulatory Visit (INDEPENDENT_AMBULATORY_CARE_PROVIDER_SITE_OTHER): Payer: Medicare Other | Admitting: Internal Medicine

## 2014-05-05 ENCOUNTER — Ambulatory Visit (INDEPENDENT_AMBULATORY_CARE_PROVIDER_SITE_OTHER)
Admission: RE | Admit: 2014-05-05 | Discharge: 2014-05-05 | Disposition: A | Discharge status: Home / Self Care | Payer: Medicare Other | Source: Ambulatory Visit | Attending: Internal Medicine | Admitting: Internal Medicine

## 2014-05-05 ENCOUNTER — Encounter: Payer: Self-pay | Admitting: Internal Medicine

## 2014-05-05 VITALS — BP 111/66 | HR 64 | Temp 97.8°F | Ht 65.5 in | Wt 264.5 lb

## 2014-05-05 DIAGNOSIS — K219 Gastro-esophageal reflux disease without esophagitis: Secondary | ICD-10-CM

## 2014-05-05 DIAGNOSIS — R059 Cough, unspecified: Secondary | ICD-10-CM

## 2014-05-05 DIAGNOSIS — R05 Cough: Secondary | ICD-10-CM | POA: Diagnosis not present

## 2014-05-05 DIAGNOSIS — R0989 Other specified symptoms and signs involving the circulatory and respiratory systems: Secondary | ICD-10-CM

## 2014-05-05 DIAGNOSIS — J209 Acute bronchitis, unspecified: Secondary | ICD-10-CM

## 2014-05-05 MED ORDER — PREDNISONE 10 MG PO TABS: ORAL_TABLET | ORAL | Status: DC

## 2014-05-05 NOTE — Progress Notes (Signed)
Pre visit review using our clinic review tool, if applicable. No additional management support is needed unless otherwise documented below in the visit note. 

## 2014-05-05 NOTE — Progress Notes (Signed)
Patient ID: Jodi Harris, female   DOB: 06/07/1940, 74 y.o.   MRN: [id number]   Subjective:    Patient ID: Jodi MaisSara Compton Harris, female    DOB: 03/19/1940, 74 y.o.   MRN: [id number]  HPI  Patient here as a work in with concerns regarding persistent cough and congestion.  Was seen 8 days ago.  Diagnosed with bronchitis.  Treated with zpak.  Some better.  Still with head fullness.  Persistent chest congestion and cough.  Some wheezing.  Eating and drinking.  No diarrhea.  Symptoms started three weeks ago. No sore throat.  Cough has been productive of yellow mucus.  Is better.     Past Medical History  Diagnosis Date  . Hypothyroidism   . Hypercholesterolemia   . GERD (gastroesophageal reflux disease)   . Anemia   . Thrombophlebitis     varicosities    Outpatient Encounter Prescriptions as of 05/05/2014  Medication Sig  . calcium carbonate (TUMS EX) 750 MG chewable tablet Chew 1 tablet by mouth as needed.   . Cholecalciferol (VITAMIN D-3 PO) Take 2,000 Units by mouth daily.  Marland Kitchen. esomeprazole (NEXIUM) 20 MG capsule Take 20 mg by mouth daily.  . fluticasone (FLONASE) 50 MCG/ACT nasal spray Place 2 sprays into both nostrils daily as needed.  Marland Kitchen. levothyroxine (SYNTHROID, LEVOTHROID) 125 MCG tablet Take 1 tablet (125 mcg total) by mouth daily.  . Multiple Vitamins-Minerals (MULTIVITAMIN PO) Take by mouth.  . Naproxen Sodium (ALEVE PO) Take by mouth as needed.   . Probiotic Product (ALIGN PO) Take 1 capsule by mouth daily.  . predniSONE (DELTASONE) 10 MG tablet Take 6 tablets x 1 day and then decrease by 1/2 tablet per day until down to zero mg.  . [DISCONTINUED] azithromycin (ZITHROMAX) 250 MG tablet Take 2 tablets by mouth then 1 tablet by mouth for 4 days.    Review of Systems  Constitutional: Negative for appetite change and unexpected weight change.  HENT: Positive for congestion (nasal congestion.  ears feel plugged.  ). Negative for sinus pressure.        Ears feel plugged.      Eyes: Negative for discharge and redness.  Respiratory: Positive for cough (productive of yellow mucus.  is better. ) and wheezing. Negative for chest tightness and shortness of breath.   Cardiovascular: Negative for chest pain, palpitations and leg swelling.  Gastrointestinal: Negative for abdominal pain and diarrhea.  Neurological: Negative for dizziness, light-headedness and headaches.      Objective:    Physical Exam  Constitutional: She appears well-developed and well-nourished. No distress.  HENT:  Mouth/Throat: Oropharynx is clear and moist.  Nares with slightly erythematous turbinates.   Eyes: Right eye exhibits no discharge. Left eye exhibits no discharge.  Neck: Neck supple.  Cardiovascular: Normal rate and regular rhythm.   Pulmonary/Chest: Breath sounds normal. No respiratory distress. She has no wheezes.  Increased cough with expiration.   Lymphadenopathy:    She has no cervical adenopathy.    BP 111/66 mmHg  Pulse 64  Temp(Src) 97.8 F (36.6 C) (Oral)  Ht 5' 5.5" (1.664 m)  Wt 264 lb 8 oz (119.976 kg)  BMI 43.33 kg/m2  SpO2 96%  LMP 02/01/1993 Wt Readings from Last 3 Encounters:  05/05/14 264 lb 8 oz (119.976 kg)  04/27/14 259 lb 12.8 oz (117.845 kg)  04/02/14 266 lb 8 oz (120.884 kg)     Lab Results  Component Value Date   WBC 4.9 03/26/2014   HGB 12.8  03/26/2014   HCT 39.3 03/26/2014   PLT 229.0 03/26/2014   GLUCOSE 88 03/26/2014   CHOL 201* 03/26/2014   TRIG 75.0 03/26/2014   HDL 55.30 03/26/2014   LDLDIRECT 129.8 02/15/2012   LDLCALC 131* 03/26/2014   ALT 13 03/26/2014   AST 21 03/26/2014   NA 140 03/26/2014   K 4.3 03/26/2014   CL 102 03/26/2014   CREATININE 0.77 03/26/2014   BUN 15 03/26/2014   CO2 32 03/26/2014   TSH 1.80 03/26/2014       Assessment & Plan:   Problem List Items Addressed This Visit    Acute bronchitis    Treated with zpak previously.  Symptoms have improved.  Increased cough with forced expiration/expiration.   Do not feel abx warranted.  Treat with prednisone taper as directed.  Saline nasal spray and flonase nasal spray as directed.  Mucinex DM and robitussin DM as directed.  Rest.  Fluids.        GERD (gastroesophageal reflux disease)    Controlled.         Other Visit Diagnoses    Cough    -  Primary    Relevant Orders    DG Chest 2 View (Completed)    Chest congestion        Relevant Orders    DG Chest 2 View (Completed)        Dale Huslia, MD

## 2014-05-05 NOTE — Patient Instructions (Signed)
Saline nasal spray - flush nose at least 2-3x/day  nasacort nasal spray - 2 sprays each nostril one time per day.  Do this in the evening.   Mucinex DM in the am and robitussin DM in the evening 

## 2014-05-07 ENCOUNTER — Encounter: Payer: Self-pay | Admitting: Internal Medicine

## 2014-05-07 NOTE — Assessment & Plan Note (Signed)
Treated with zpak previously.  Symptoms have improved.  Increased cough with forced expiration/expiration.  Do not feel abx warranted.  Treat with prednisone taper as directed.  Saline nasal spray and flonase nasal spray as directed.  Mucinex DM and robitussin DM as directed.  Rest.  Fluids.

## 2014-05-07 NOTE — Assessment & Plan Note (Signed)
Controlled.  

## 2014-07-15 ENCOUNTER — Other Ambulatory Visit: Payer: Self-pay | Admitting: *Deleted

## 2014-07-15 MED ORDER — LEVOTHYROXINE SODIUM 125 MCG PO TABS: 125.0000 ug | ORAL_TABLET | Freq: Every day | ORAL | Status: DC

## 2014-07-27 ENCOUNTER — Ambulatory Visit (INDEPENDENT_AMBULATORY_CARE_PROVIDER_SITE_OTHER): Payer: 59 | Admitting: Nurse Practitioner

## 2014-07-27 VITALS — BP 110/68 | HR 68 | Temp 98.1°F | Resp 16 | Ht 66.0 in | Wt 252.8 lb

## 2014-07-27 DIAGNOSIS — J209 Acute bronchitis, unspecified: Secondary | ICD-10-CM | POA: Diagnosis not present

## 2014-07-27 NOTE — Progress Notes (Signed)
   Subjective:    Patient ID: Jodi Harris, female    DOB: 05/25/1940, 74 y.o.   MRN: [id number]  HPI  [name] is a 74 yo female with a CC of sinusitis x 2 weeks.   1) Dizziness with position changes in the bed happened once, fullness, a little cough at night- dry non-productive  Frontal headache- mild Zyrtec- not taking Flonase- prn  Saline nasal spray- daily  Align- Daily   Leaving [place] 10 [place] to [place].   Last seen in April with similar concerns   Chest X-ray 05/05/14- Mild cardiomegaly, No CHF  No acute pulmonary disease   Review of Systems  Constitutional: Negative for fever, chills, diaphoresis and fatigue.  Eyes: Negative for visual disturbance.  Respiratory: Positive for cough. Negative for chest tightness, shortness of breath and wheezing.   Cardiovascular: Negative for chest pain, palpitations and leg swelling.  Gastrointestinal: Negative for nausea, vomiting and diarrhea.  Skin: Negative for rash.  Neurological: Positive for dizziness and headaches. Negative for weakness, light-headedness and numbness.  Psychiatric/Behavioral: The patient is not nervous/anxious.       Objective:   Physical Exam  Constitutional: She is oriented to person, place, and time. She appears well-developed and well-nourished. No distress.  BP 110/68 mmHg  Pulse 68  Temp(Src) 98.1 F (36.7 C)  Resp 16  Ht 5\' 6"  (1.676 m)  Wt 252 lb 12.8 oz (114.669 kg)  BMI 40.82 kg/m2  SpO2 97%  LMP 02/01/1993   HENT:  Head: Normocephalic and atraumatic.  Right Ear: External ear normal.  Left Ear: External ear normal.  Cardiovascular: Normal rate and regular rhythm.   Pulmonary/Chest: Effort normal and breath sounds normal. No respiratory distress. She has no wheezes. She has no rales. She exhibits no tenderness.  Neurological: She is alert and oriented to person, place, and time. No cranial nerve deficit. She exhibits normal muscle tone. Coordination normal.  Skin: Skin is warm and  dry. No rash noted. She is not diaphoretic.  Psychiatric: She has a normal mood and affect. Her behavior is normal. Judgment and thought content normal.      Assessment & Plan:

## 2014-07-27 NOTE — Progress Notes (Signed)
Pre visit review using our clinic review tool, if applicable. No additional management support is needed unless otherwise documented below in the visit note. 

## 2014-07-27 NOTE — Patient Instructions (Signed)
Have a great time on your trip.   Airborne is a great way to prevent further illness and strengthen the immune system.   An antibiotic is not warranted at this time.

## 2014-08-04 ENCOUNTER — Encounter: Payer: Self-pay | Admitting: Nurse Practitioner

## 2014-08-04 NOTE — Assessment & Plan Note (Signed)
Improved. Pt no need for antibiotic. Pt leaving for [place] and wanted to make sure. She was asking about taking Airborne to help her immune system. I encouraged her to try this. FU prn worsening/failure to improve.

## 2014-10-01 ENCOUNTER — Other Ambulatory Visit (INDEPENDENT_AMBULATORY_CARE_PROVIDER_SITE_OTHER): Payer: 59

## 2014-10-01 DIAGNOSIS — E7800 Pure hypercholesterolemia, unspecified: Secondary | ICD-10-CM

## 2014-10-01 LAB — COMPREHENSIVE METABOLIC PANEL
ALT: 10 U/L (ref 0–35)
AST: 17 U/L (ref 0–37)
Albumin: 3.5 g/dL (ref 3.5–5.2)
Alkaline Phosphatase: 65 U/L (ref 39–117)
BUN: 19 mg/dL (ref 6–23)
CHLORIDE: 104 meq/L (ref 96–112)
CO2: 31 meq/L (ref 19–32)
Calcium: 9.1 mg/dL (ref 8.4–10.5)
Creatinine, Ser: 0.73 mg/dL (ref 0.40–1.20)
GFR: 82.86 mL/min (ref >60.00)
GLUCOSE: 86 mg/dL (ref 70–99)
POTASSIUM: 4.1 meq/L (ref 3.5–5.1)
SODIUM: 140 meq/L (ref 135–145)
Total Bilirubin: 0.5 mg/dL (ref 0.2–1.2)
Total Protein: 6.5 g/dL (ref 6.0–8.3)

## 2014-10-01 LAB — LIPID PANEL
CHOLESTEROL: 180 mg/dL (ref 0–200)
HDL: 50.2 mg/dL (ref >39.00)
LDL Cholesterol: 116 mg/dL — ABNORMAL HIGH (ref 0–99)
NONHDL: 129.65
Total CHOL/HDL Ratio: 4
Triglycerides: 66 mg/dL (ref 0.0–149.0)
VLDL: 13.2 mg/dL (ref 0.0–40.0)

## 2014-10-05 ENCOUNTER — Ambulatory Visit (INDEPENDENT_AMBULATORY_CARE_PROVIDER_SITE_OTHER): Payer: 59 | Admitting: Internal Medicine

## 2014-10-05 ENCOUNTER — Encounter: Payer: Self-pay | Admitting: Internal Medicine

## 2014-10-05 VITALS — BP 100/70 | HR 61 | Temp 98.1°F | Resp 18 | Ht 64.25 in | Wt 250.8 lb

## 2014-10-05 DIAGNOSIS — K219 Gastro-esophageal reflux disease without esophagitis: Secondary | ICD-10-CM

## 2014-10-05 DIAGNOSIS — F439 Reaction to severe stress, unspecified: Secondary | ICD-10-CM

## 2014-10-05 DIAGNOSIS — Z0000 Encounter for general adult medical examination without abnormal findings: Secondary | ICD-10-CM | POA: Diagnosis not present

## 2014-10-05 DIAGNOSIS — Z860100 Personal history of colon polyps, unspecified: Secondary | ICD-10-CM

## 2014-10-05 DIAGNOSIS — E669 Obesity, unspecified: Secondary | ICD-10-CM

## 2014-10-05 DIAGNOSIS — E7800 Pure hypercholesterolemia, unspecified: Secondary | ICD-10-CM

## 2014-10-05 DIAGNOSIS — Z01419 Encounter for gynecological examination (general) (routine) without abnormal findings: Secondary | ICD-10-CM | POA: Diagnosis not present

## 2014-10-05 DIAGNOSIS — E039 Hypothyroidism, unspecified: Secondary | ICD-10-CM

## 2014-10-05 DIAGNOSIS — E559 Vitamin D deficiency, unspecified: Secondary | ICD-10-CM

## 2014-10-05 NOTE — Patient Instructions (Signed)
Zantac (ranitidine) 150mg - take one tablet 30 minutes before breakfast and one tablet 30 minutes before your evening meal.   

## 2014-10-05 NOTE — Progress Notes (Signed)
Patient ID: Jodi Harris, female   DOB: [birth year], 74 y.o.   MRN: [id number]   Subjective:    Patient ID: Jodi Harris, female    DOB: January 13, 1940, 74 y.o.   MRN: [id number]  HPI  Patient with past history of hypothyroidism and hypercholesterolemia.  Comes in today to follow up on these issues as well as for a complete physical exam.  She has adjusted her diet.  Lost weight.  Feels better.  Off nexium.  Taking zantac.  No chest pain or tightness.  No sob.  No abdominal pain or cramping.   Bowels stable.  Handling stress well.  Overdue f/u with gyn.     Past Medical History  Diagnosis Date  . Hypothyroidism   . Hypercholesterolemia   . GERD (gastroesophageal reflux disease)   . Anemia   . Thrombophlebitis     varicosities   Past Surgical History  Procedure Laterality Date  . Urethral diverticulum repair  1974    Dr Jenne Pane  . Laparoscopic gastric banding    . Colon surgery      laparoscopic assisted transverse colectomy for tubular adenomatous polyp   Family History  Problem Relation Age of Onset  . Cancer Father     lymphoma  . Cancer Maternal Aunt     Breast  . Colon cancer      one relative on maternal side   Social History   Social History  . Marital Status: Married    Spouse Name: N/A  . Number of Children: 2  . Years of Education: N/A   Social History Main Topics  . Smoking status: Former Games developer  . Smokeless tobacco: Never Used     Comment: smoked as a teenager  . Alcohol Use: 0.0 oz/week    0 Standard drinks or equivalent per week     Comment: has a glass of wine about once a month  . Drug Use: No  . Sexual Activity: Not Asked   Other Topics Concern  . None   Social History Narrative    Outpatient Encounter Prescriptions as of 10/05/2014  Medication Sig  . calcium carbonate (TUMS EX) 750 MG chewable tablet Chew 1 tablet by mouth as needed.   . Cholecalciferol (VITAMIN D-3 PO) Take 2,000 Units by mouth daily.  . fluticasone (FLONASE) 50  MCG/ACT nasal spray Place 2 sprays into both nostrils daily as needed.  Marland Kitchen levothyroxine (SYNTHROID, LEVOTHROID) 125 MCG tablet Take 1 tablet (125 mcg total) by mouth daily.  . Multiple Vitamins-Minerals (MULTIVITAMIN PO) Take by mouth.  . Naproxen Sodium (ALEVE PO) Take by mouth as needed.   . Probiotic Product (ALIGN PO) Take 1 capsule by mouth daily.  . ranitidine (ZANTAC) 150 MG tablet Take 150 mg by mouth daily as needed for heartburn.  . [DISCONTINUED] esomeprazole (NEXIUM) 20 MG capsule Take 20 mg by mouth daily.  . [DISCONTINUED] predniSONE (DELTASONE) 10 MG tablet Take 6 tablets x 1 day and then decrease by 1/2 tablet per day until down to zero mg.   No facility-administered encounter medications on file as of 10/05/2014.    Review of Systems  Constitutional: Negative for appetite change and unexpected weight change.  HENT: Negative for congestion and sinus pressure.   Eyes: Negative for pain and visual disturbance.  Respiratory: Negative for cough, chest tightness and shortness of breath.   Cardiovascular: Negative for chest pain, palpitations and leg swelling.  Gastrointestinal: Negative for nausea, vomiting, abdominal pain and diarrhea.  Genitourinary: Negative for dysuria  and difficulty urinating.  Musculoskeletal: Negative for back pain and joint swelling.  Skin: Negative for color change and rash.  Neurological: Negative for dizziness, light-headedness and headaches.  Hematological: Negative for adenopathy. Does not bruise/bleed easily.  Psychiatric/Behavioral: Negative for dysphoric mood and decreased concentration.       Objective:    Physical Exam  Constitutional: She appears well-developed and well-nourished. No distress.  HENT:  Nose: Nose normal.  Mouth/Throat: Oropharynx is clear and moist.  Eyes: Conjunctivae are normal. Right eye exhibits no discharge. Left eye exhibits no discharge.  Neck: Neck supple. No thyromegaly present.  Cardiovascular: Normal rate  and regular rhythm.   Pulmonary/Chest: Breath sounds normal. No respiratory distress. She has no wheezes.  Abdominal: Soft. Bowel sounds are normal. There is no tenderness.  Genitourinary:  GYN performs.   Musculoskeletal: She exhibits no edema or tenderness.  Lymphadenopathy:    She has no cervical adenopathy.  Skin: No rash noted. No erythema.  Psychiatric: She has a normal mood and affect. Her behavior is normal.    BP 100/70 mmHg  Pulse 61  Temp(Src) 98.1 F (36.7 C) (Oral)  Resp 18  Ht 5' 4.25" (1.632 m)  Wt 250 lb 12 oz (113.739 kg)  BMI 42.70 kg/m2  SpO2 94%  LMP 02/01/1993 Wt Readings from Last 3 Encounters:  10/05/14 250 lb 12 oz (113.739 kg)  07/27/14 252 lb 12.8 oz (114.669 kg)  05/05/14 264 lb 8 oz (119.976 kg)     Lab Results  Component Value Date   WBC 4.9 03/26/2014   HGB 12.8 03/26/2014   HCT 39.3 03/26/2014   PLT 229.0 03/26/2014   GLUCOSE 86 10/01/2014   CHOL 180 10/01/2014   TRIG 66.0 10/01/2014   HDL 50.20 10/01/2014   LDLDIRECT 129.8 02/15/2012   LDLCALC 116* 10/01/2014   ALT 10 10/01/2014   AST 17 10/01/2014   NA 140 10/01/2014   K 4.1 10/01/2014   CL 104 10/01/2014   CREATININE 0.73 10/01/2014   BUN 19 10/01/2014   CO2 31 10/01/2014   TSH 1.80 03/26/2014    Dg Chest 2 View  05/05/2014   CLINICAL DATA:  Productive cough.  Congestion.  EXAM: CHEST  2 VIEW  COMPARISON:  None.  FINDINGS: Mediastinum and hilar structures are normal. The lungs are clear. Cardiomegaly with normal pulmonary vascularity. No pleural effusion or pneumothorax. Degenerative changes thoracic spine.  IMPRESSION: 1. Mild cardiomegaly.  No CHF. 2. No acute pulmonary disease.   Electronically Signed   By: Maisie Fus  Register   On: 05/05/2014 12:43       Assessment & Plan:   Problem List Items Addressed This Visit    GERD (gastroesophageal reflux disease)    Has adjusted diet and lost weight.  Feels better.  Off nexium.  On zantac.  Follow.        Relevant Medications     ranitidine (ZANTAC) 150 MG tablet   Health care maintenance    Breast, pelvic and pap smears through gyn.  She is overdue.  Request [place] to make an appt.  scheudle f/u with gyn.  Mammogram 03/05/14 - Birads I.  Colonoscopy 03/31/14 - diverticulosis, internal hemorrhoids and one 3mm polyp - tubular adenoma.  Recommended f/u in 3-5 years.        History of colon polyps    Colonoscopy 03/31/14 - tubular adenoma.  Recommended f/u colonoscopy in five years.        Hypercholesterolemia    Low cholesterol diet and exercise.  Follow lipid panel.        Hypothyroidism    On thyroid replacement.  Followtsh.        Obesity    Has adjusted diet.  Lost weight.  Feels better.  Continue diet and exercise.        Stress    Doing better.  Follow.        Vitamin D deficiency    Continue vitamin D supplements.  Follow.         Other Visit Diagnoses    Encounter for routine gynecological examination    -  Primary    Relevant Orders    Ambulatory referral to Gynecology        Dale Bagnell, MD

## 2014-10-05 NOTE — Progress Notes (Signed)
Pre-visit discussion using our clinic review tool. No additional management support is needed unless otherwise documented below in the visit note.  

## 2014-10-09 ENCOUNTER — Encounter: Payer: Self-pay | Admitting: Internal Medicine

## 2014-10-09 NOTE — Assessment & Plan Note (Signed)
Low cholesterol diet and exercise.  Follow lipid panel.   

## 2014-10-09 NOTE — Assessment & Plan Note (Signed)
Colonoscopy 03/31/14 - tubular adenoma.  Recommended f/u colonoscopy in five years.

## 2014-10-09 NOTE — Assessment & Plan Note (Signed)
Doing better.  Follow.   

## 2014-10-09 NOTE — Assessment & Plan Note (Signed)
Has adjusted diet and lost weight.  Feels better.  Off nexium.  On zantac.  Follow.

## 2014-10-09 NOTE — Assessment & Plan Note (Signed)
On thyroid replacement.  Follow tsh.  

## 2014-10-09 NOTE — Assessment & Plan Note (Signed)
Has adjusted diet.  Lost weight.  Feels better.  Continue diet and exercise.

## 2014-10-09 NOTE — Assessment & Plan Note (Signed)
Breast, pelvic and pap smears through gyn.  She is overdue.  Request [place] to make an appt.  scheudle f/u with gyn.  Mammogram 03/05/14 - Birads I.  Colonoscopy 03/31/14 - diverticulosis, internal hemorrhoids and one 3mm polyp - tubular adenoma.  Recommended f/u in 3-5 years.

## 2014-10-09 NOTE — Assessment & Plan Note (Signed)
Continue vitamin D supplements.  Follow.   

## 2014-11-17 ENCOUNTER — Encounter: Payer: Self-pay | Admitting: Internal Medicine

## 2014-12-08 ENCOUNTER — Encounter: Payer: Self-pay | Admitting: Obstetrics and Gynecology

## 2014-12-10 ENCOUNTER — Encounter: Payer: Self-pay | Admitting: Obstetrics and Gynecology

## 2014-12-21 ENCOUNTER — Telehealth: Payer: Self-pay | Admitting: Internal Medicine

## 2014-12-21 NOTE — Telephone Encounter (Signed)
Please advise 

## 2014-12-21 NOTE — Telephone Encounter (Signed)
Pt called about having sinus infection and coughing. Pt states in the past Dr Lorin PicketScott has called in a Z pac before. Pharmacy is [facility] - [place], [place] - [address] Thank You!

## 2014-12-21 NOTE — Telephone Encounter (Signed)
She will need to be seen to treat.  I can see her tomorrow 12/22/14 at 4:00 - work in for this.

## 2014-12-22 ENCOUNTER — Encounter: Payer: Self-pay | Admitting: Internal Medicine

## 2014-12-22 ENCOUNTER — Ambulatory Visit (INDEPENDENT_AMBULATORY_CARE_PROVIDER_SITE_OTHER): Payer: 59 | Admitting: Internal Medicine

## 2014-12-22 VITALS — BP 110/62 | HR 71 | Temp 98.0°F | Resp 18 | Ht 64.25 in | Wt 256.0 lb

## 2014-12-22 DIAGNOSIS — J069 Acute upper respiratory infection, unspecified: Secondary | ICD-10-CM | POA: Diagnosis not present

## 2014-12-22 MED ORDER — AZITHROMYCIN 250 MG PO TABS: ORAL_TABLET | ORAL | Status: DC

## 2014-12-22 MED ORDER — FLUTICASONE PROPIONATE HFA 110 MCG/ACT IN AERO: 2.0000 | INHALATION_SPRAY | Freq: Two times a day (BID) | RESPIRATORY_TRACT | Status: DC

## 2014-12-22 NOTE — Progress Notes (Signed)
Pre-visit discussion using our clinic review tool. No additional management support is needed unless otherwise documented below in the visit note.  

## 2014-12-22 NOTE — Progress Notes (Signed)
Patient ID: Jodi Harris, female   DOB: 10/14/1940, [age] y.o.   MRN: [id number]   Subjective:    Patient ID: Jodi MaisSara Compton Harris, female    DOB: 07/07/1940, 74 y.o.   MRN: [id number]  HPI  Patient with past history of hypercholesterolemia, GERD and hypothyroidism.  She comes in today as a work in with concerns regarding increased head congestion and cough.  States that starting 10 days ago, she developed scratchy throat and increased drainage.  Then developed head fullness and increased cough.  Some wheezing.  No fever.  Tickle in her chest.  Took mucinex.  No vomiting.  Bowels stable.    Past Medical History  Diagnosis Date  . Hypothyroidism   . Hypercholesterolemia   . GERD (gastroesophageal reflux disease)   . Anemia   . Thrombophlebitis     varicosities   Past Surgical History  Procedure Laterality Date  . Urethral diverticulum repair  1974    Dr [name]  . Laparoscopic gastric banding    . Colon surgery      laparoscopic assisted transverse colectomy for tubular adenomatous polyp   Family History  Problem Relation Age of Onset  . Cancer Father     lymphoma  . Cancer Maternal Aunt     Breast  . Colon cancer      one relative on maternal side   Social History   Social History  . Marital Status: Married    Spouse Name: N/A  . Number of Children: 2  . Years of Education: N/A   Social History Main Topics  . Smoking status: Former Games developermoker  . Smokeless tobacco: Never Used     Comment: smoked as a teenager  . Alcohol Use: 0.0 oz/week    0 Standard drinks or equivalent per week     Comment: has a glass of wine about once a month  . Drug Use: No  . Sexual Activity: Not Asked   Other Topics Concern  . None   Social History Narrative    Outpatient Encounter Prescriptions as of 12/22/2014  Medication Sig  . calcium carbonate (TUMS EX) 750 MG chewable tablet Chew 1 tablet by mouth as needed.   . Cholecalciferol (VITAMIN D-3 PO) Take 2,000 Units by mouth daily.    Marland Kitchen. esomeprazole (NEXIUM) 20 MG capsule Take 20 mg by mouth daily at 12 noon.  . fluticasone (FLONASE) 50 MCG/ACT nasal spray Place 2 sprays into both nostrils daily as needed.  Marland Kitchen. levothyroxine (SYNTHROID, LEVOTHROID) 125 MCG tablet Take 1 tablet (125 mcg total) by mouth daily.  . Multiple Vitamins-Minerals (MULTIVITAMIN PO) Take by mouth.  . Naproxen Sodium (ALEVE PO) Take by mouth as needed.   . Probiotic Product (ALIGN PO) Take 1 capsule by mouth daily.  . [DISCONTINUED] ranitidine (ZANTAC) 150 MG tablet Take 150 mg by mouth daily as needed for heartburn.  Marland Kitchen. azithromycin (ZITHROMAX) 250 MG tablet Take 2 tablets x 1 day and then one tablet per day for four more days.  . fluticasone (FLOVENT HFA) 110 MCG/ACT inhaler Inhale 2 puffs into the lungs 2 (two) times daily.   No facility-administered encounter medications on file as of 12/22/2014.    Review of Systems  Constitutional: Negative for appetite change and unexpected weight change.  HENT: Positive for congestion, postnasal drip and sinus pressure.        Scratchy throat.   Eyes: Negative for discharge and redness.  Respiratory: Positive for cough and wheezing. Negative for chest tightness and shortness of  breath.   Cardiovascular: Negative for chest pain and leg swelling.  Gastrointestinal: Negative for nausea, vomiting and diarrhea.  Skin: Negative for color change and rash.  Neurological: Negative for dizziness and light-headedness.  Psychiatric/Behavioral: Negative for dysphoric mood and agitation.       Objective:    Physical Exam  Constitutional: She appears well-developed and well-nourished. No distress.  HENT:  Mouth/Throat: Oropharynx is clear and moist.  Nares with slightly erythematous turbinates.  Minimal tenderness to palpation over the sinuses.    Neck: Neck supple.  Cardiovascular: Normal rate and regular rhythm.   Pulmonary/Chest: Breath sounds normal. No respiratory distress. She has no wheezes.  Abdominal:  Bowel sounds are normal.  Lymphadenopathy:    She has no cervical adenopathy.    BP 110/62 mmHg  Pulse 71  Temp(Src) 98 F (36.7 C) (Oral)  Resp 18  Ht 5' 4.25" (1.632 m)  Wt 256 lb (116.121 kg)  BMI 43.60 kg/m2  SpO2 94%  LMP 02/01/1993 Wt Readings from Last 3 Encounters:  12/22/14 256 lb (116.121 kg)  10/05/14 250 lb 12 oz (113.739 kg)  07/27/14 252 lb 12.8 oz (114.669 kg)     Lab Results  Component Value Date   WBC 4.9 03/26/2014   HGB 12.8 03/26/2014   HCT 39.3 03/26/2014   PLT 229.0 03/26/2014   GLUCOSE 86 10/01/2014   CHOL 180 10/01/2014   TRIG 66.0 10/01/2014   HDL 50.20 10/01/2014   LDLDIRECT 129.8 02/15/2012   LDLCALC 116* 10/01/2014   ALT 10 10/01/2014   AST 17 10/01/2014   NA 140 10/01/2014   K 4.1 10/01/2014   CL 104 10/01/2014   CREATININE 0.73 10/01/2014   BUN 19 10/01/2014   CO2 31 10/01/2014   TSH 1.80 03/26/2014    Dg Chest 2 View  05/05/2014  CLINICAL DATA:  Productive cough.  Congestion. EXAM: CHEST  2 VIEW COMPARISON:  None. FINDINGS: Mediastinum and hilar structures are normal. The lungs are clear. Cardiomegaly with normal pulmonary vascularity. No pleural effusion or pneumothorax. Degenerative changes thoracic spine. IMPRESSION: 1. Mild cardiomegaly.  No CHF. 2. No acute pulmonary disease. Electronically Signed   By: Maisie Fus  Register   On: 05/05/2014 12:43       Assessment & Plan:   Problem List Items Addressed This Visit    URI (upper respiratory infection) - Primary    Symptoms c/w sinusitis/uri.  Treat with zpak as directed.  Saline nasal spray and flonase nasal spray as directed.  mucinex DM in the am and robitussin DM in the pm.  flovent inhaler as directed.  Rest.  Fluids.  Follow. Notify me if persistent problems or symptoms.        Relevant Medications   azithromycin (ZITHROMAX) 250 MG tablet       Dale [place], MD

## 2014-12-22 NOTE — Patient Instructions (Signed)
Saline nasal spray - flush nose at least 2-3x/day  flonase nasal spray - 2 sprays each nostril one time per day.  Do this in the evening.    mucinex DM in the am and robitussin DM in the evening.    flovent inhaler - 2 puffs twice a day.  Rinse mouth after use.

## 2014-12-28 ENCOUNTER — Encounter: Payer: Self-pay | Admitting: Internal Medicine

## 2014-12-28 DIAGNOSIS — J069 Acute upper respiratory infection, unspecified: Secondary | ICD-10-CM | POA: Insufficient documentation

## 2014-12-28 NOTE — Assessment & Plan Note (Signed)
Symptoms c/w sinusitis/uri.  Treat with zpak as directed.  Saline nasal spray and flonase nasal spray as directed.  mucinex DM in the am and robitussin DM in the pm.  flovent inhaler as directed.  Rest.  Fluids.  Follow. Notify me if persistent problems or symptoms.

## 2015-01-07 ENCOUNTER — Ambulatory Visit: Payer: 59

## 2015-01-14 ENCOUNTER — Other Ambulatory Visit: Payer: Self-pay | Admitting: Internal Medicine

## 2015-01-14 ENCOUNTER — Encounter: Payer: Self-pay | Admitting: Obstetrics and Gynecology

## 2015-01-29 ENCOUNTER — Ambulatory Visit (INDEPENDENT_AMBULATORY_CARE_PROVIDER_SITE_OTHER): Payer: 59

## 2015-01-29 VITALS — BP 122/68 | HR 64 | Temp 97.1°F | Resp 14 | Ht 64.0 in | Wt 259.8 lb

## 2015-01-29 DIAGNOSIS — Z1239 Encounter for other screening for malignant neoplasm of breast: Secondary | ICD-10-CM

## 2015-01-29 DIAGNOSIS — E2839 Other primary ovarian failure: Secondary | ICD-10-CM | POA: Diagnosis not present

## 2015-01-29 DIAGNOSIS — Z0000 Encounter for general adult medical examination without abnormal findings: Secondary | ICD-10-CM

## 2015-01-29 NOTE — Progress Notes (Signed)
Subjective:   Jodi Harris is a 75 y.o. female who presents for Medicare Annual (Subsequent) preventive examination.  Review of Systems:  No ROS.  Medicare Wellness Visit.  Cardiac Risk Factors include: advanced age (>73men, >67 women)     Objective:     Vitals: BP 122/68 mmHg  Pulse 64  Temp(Src) 97.1 F (36.2 C) (Oral)  Resp 14  Ht  (1.626 m)  Wt 259 lb 12.8 oz (117.845 kg)  BMI 44.57 kg/m2  SpO2 97%  LMP 02/01/1993  Tobacco History  Smoking status  . Former Smoker  Smokeless tobacco  . Never Used    Comment: smoked as a teenager     Counseling given: Not Answered   Past Medical History  Diagnosis Date  . Hypothyroidism   . Hypercholesterolemia   . GERD (gastroesophageal reflux disease)   . Anemia   . Thrombophlebitis     varicosities   Past Surgical History  Procedure Laterality Date  . Urethral diverticulum repair  1974    Dr Jenne Pane  . Laparoscopic gastric banding    . Colon surgery      laparoscopic assisted transverse colectomy for tubular adenomatous polyp   Family History  Problem Relation Age of Onset  . Cancer Father     lymphoma  . Cancer Maternal Aunt     Breast  . Colon cancer      one relative on maternal side   History  Sexual Activity  . Sexual Activity: No    Outpatient Encounter Prescriptions as of 01/29/2015  Medication Sig  . calcium carbonate (TUMS EX) 750 MG chewable tablet Chew 1 tablet by mouth as needed.   . Cholecalciferol (VITAMIN D-3 PO) Take 2,000 Units by mouth daily.  Marland Kitchen esomeprazole (NEXIUM) 20 MG capsule Take 20 mg by mouth daily at 12 noon.  . fluticasone (FLONASE) 50 MCG/ACT nasal spray Place 2 sprays into both nostrils daily as needed.  . fluticasone (FLOVENT HFA) 110 MCG/ACT inhaler Inhale 2 puffs into the lungs 2 (two) times daily.  . Multiple Vitamins-Minerals (MULTIVITAMIN PO) Take by mouth.  . Naproxen Sodium (ALEVE PO) Take by mouth as needed.   . Probiotic Product (ALIGN PO) Take 1  capsule by mouth daily.  Marland Kitchen SYNTHROID 125 MCG tablet Take 1 tablet (125 mcg total) by mouth daily.  . [DISCONTINUED] azithromycin (ZITHROMAX) 250 MG tablet Take 2 tablets x 1 day and then one tablet per day for four more days.   No facility-administered encounter medications on file as of 01/29/2015.    Activities of Daily Living In your present state of health, do you have any difficulty performing the following activities: 01/29/2015  Hearing? N  Vision? N  Difficulty concentrating or making decisions? N  Walking or climbing stairs? Y  Dressing or bathing? N  Doing errands, shopping? N  Preparing Food and eating ? N  Using the Toilet? N  In the past six months, have you accidently leaked urine? N  Do you have problems with loss of bowel control? N  Managing your Medications? N  Managing your Finances? N  Housekeeping or managing your Housekeeping? N    Patient Care Team: Dale [place], MD as PCP - General (Internal Medicine)    Assessment:   This is a routine wellness examination for Jodi Harris. The goal of the wellness visit is to assist the patient how to close the gaps in care and create a preventative care plan for the patient.   Taking Calcium VIT  D as appropriate/Osteoporosis risk reviewed.   Medications reviewed; taking without issues or barriers.   Safety issues reviewed; smoke detectors in the home. Firearms locked in a secure area in the home. Wears seatbelts when driving or riding with others. No violence in the home.  No identified risk were noted; The patient was oriented x 3; appropriate in dress and manner and no objective failures at ADL's or IADL's.   DEXA Scan appointment scheduled.  Mammogram appointment scheduled.  Patient Concerns: None at this time.  Follow up with PCP as needed.   Exercise Activities and Dietary recommendations Current Exercise Habits:: The patient does not participate in regular exercise at present  Goals    . Increase  physical activity     Start using the home exercising bike 3 times a week for 20 minute sessions.  Walk more, as tolerated.        Fall Risk Fall Risk  01/29/2015 10/05/2014 09/22/2013 09/22/2013 04/10/2013  Falls in the past year? No No Yes Yes Yes  Number falls in past yr: - - Injury with Fall? - - Yes No No  Risk for fall due to : - - - Impaired balance/gait -   Depression Screen PHQ 2/9 Scores 01/29/2015 10/05/2014 09/22/2013 09/22/2013  PHQ - 2 Score 0 0 0 0     Cognitive Testing MMSE - Mini Mental State Exam 01/29/2015  Orientation to time 5  Orientation to Place 5  Registration 3  Attention/ Calculation 5  Recall 3  Language- name 2 objects 2  Language- repeat 1  Language- follow 3 step command 3  Language- read & follow direction 1  Write a sentence 1  Copy design 1  Total score 30    Immunization History  Administered Date(s) Administered  . Influenza Split 12/03/2011, 11/12/2012, 11/13/2013  . Influenza-Unspecified 11/14/2014  . Pneumococcal Conjugate-13 04/24/2013  . Pneumococcal Polysaccharide-23 02/02/2012  . Tdap 03/10/2010  . Zoster 07/01/2008   Screening Tests Health Maintenance  Topic Date Due  . DEXA SCAN  11/16/2005  . MAMMOGRAM  03/06/2015  . INFLUENZA VACCINE  08/10/2015  . COLONOSCOPY  03/31/2019  . TETANUS/TDAP  03/09/2020  . ZOSTAVAX  Addressed  . PNA vac Low Risk Adult  Completed      Plan:    End of life planning; Advance aging; Advanced directives discussed. Copy requested of current HCPOA/Living Will.   During the course of the visit the patient was educated and counseled about the following appropriate screening and preventive services:   Vaccines to include Pneumoccal, Influenza, Hepatitis B, Td, Zostavax, HCV  Electrocardiogram  Cardiovascular Disease  Colorectal cancer screening  Bone density screening  Diabetes screening  Glaucoma screening  Mammography/PAP  Nutrition counseling   Patient Instructions (the  written plan) was given to the patient.   Ashok Pall, LPN  9/60/4540   Reviewed above information.  Agree with plan.   Dr Lorin Picket

## 2015-01-29 NOTE — Patient Instructions (Addendum)
Jodi Harris,  Thank you for taking time to come for your Medicare Wellness Visit.  I appreciate your ongoing commitment to your health goals. Please review the following plan we discussed and let me know if I can assist you in the future.  Follow up with Dr. Lorin Picket as needed.  Bone Densitometry Bone densitometry is an imaging test that uses a special X-ray to measure the amount of calcium and other minerals in your bones (bone density). This test is also known as a bone mineral density test or dual-energy X-ray absorptiometry (DXA). The test can measure bone density at your hip and your spine. It is similar to having a regular X-ray. You may have this test to:  Diagnose a condition that causes weak or thin bones (osteoporosis).  Predict your risk of a broken bone (fracture).  Determine how well osteoporosis treatment is working. LET [facility] CARE PROVIDER KNOW ABOUT:  Any allergies you have.  All medicines you are taking, including vitamins, herbs, eye drops, creams, and over-the-counter medicines.  Previous problems you or members of your family have had with the use of anesthetics.  Any blood disorders you have.  Previous surgeries you have had.  Medical conditions you have.  Possibility of pregnancy.  Any other medical test you had within the previous 14 days that used contrast material. RISKS AND COMPLICATIONS Generally, this is a safe procedure. However, problems can occur and may include the following:  This test exposes you to a very small amount of radiation.  The risks of radiation exposure may be greater to unborn children. BEFORE THE PROCEDURE  Do not take any calcium supplements for 24 hours before having the test. You can otherwise eat and drink what you usually do.  Take off all metal jewelry, eyeglasses, dental appliances, and any other metal objects. PROCEDURE  You may lie on an exam table. There will be an X-ray generator below you and an imaging device  above you.  Other devices, such as boxes or braces, may be used to position your body properly for the scan.  You will need to lie still while the machine slowly scans your body.  The images will show up on a computer monitor. AFTER THE PROCEDURE You may need more testing at a later time.   This information is not intended to replace advice given to you by your health care provider. Make sure you discuss any questions you have with your health care provider.   Document Released: 01/18/2004 Document Revised: 01/16/2014 Document Reviewed: 06/05/2013 Elsevier Interactive Patient Education [employer]

## 2015-02-04 ENCOUNTER — Encounter: Payer: Self-pay | Admitting: Obstetrics and Gynecology

## 2015-02-25 ENCOUNTER — Encounter: Payer: Self-pay | Admitting: Obstetrics and Gynecology

## 2015-02-25 ENCOUNTER — Ambulatory Visit (INDEPENDENT_AMBULATORY_CARE_PROVIDER_SITE_OTHER): Payer: Medicare Other | Admitting: Obstetrics and Gynecology

## 2015-02-25 VITALS — BP 136/63 | HR 63 | Ht 66.0 in | Wt 263.3 lb

## 2015-02-25 DIAGNOSIS — Z01419 Encounter for gynecological examination (general) (routine) without abnormal findings: Secondary | ICD-10-CM | POA: Diagnosis not present

## 2015-02-25 NOTE — Patient Instructions (Signed)
  Place annual gynecologic exam patient instructions here.  Thank you for enrolling in MyChart. Please follow the instructions below to securely access your online medical record. MyChart allows you to send messages to your doctor, view your test results, manage appointments, and more.   How Do I Sign Up? 1. In your Internet browser, go to [employer] and enter [email or web address]. 2. Click on the Sign Up Now link in the Sign In box. You will see the New Member Sign Up page. 3. Enter your MyChart Access Code exactly as it appears below. You will not need to use this code after you've completed the sign-up process. If you do not sign up before the expiration date, you must request a new code.  MyChart Access Code: [email or web address] Expires: 04/26/2015  1:59 PM  4. Enter your Social Security Number ([id number]) and Date of Birth (mm/dd/yyyy) as indicated and click Submit. You will be taken to the next sign-up page. 5. Create a MyChart ID. This will be your MyChart login ID and cannot be changed, so think of one that is secure and easy to remember. 6. Create a MyChart password. You can change your password at any time. 7. Enter your Password Reset Question and Answer. This can be used at a later time if you forget your password.  8. Enter your e-mail address. You will receive e-mail notification when new information is available in MyChart. 9. Click Sign Up. You can now view your medical record.   Additional Information Remember, MyChart is NOT to be used for urgent needs. For medical emergencies, dial 911.

## 2015-02-25 NOTE — Progress Notes (Signed)
Subjective:   Jodi Harris is a 75 y.o. G47P0 Caucasian female here for a routine well-woman exam.  Patient's last menstrual period was 02/01/1993.    Current complaints: none other frequent urination PCP: C. Scott       Doesn't need labs  Social History: Sexual: heterosexual Marital Status: married Living situation: with spouse Occupation: retired Tobacco/alcohol: no tobacco use Illicit drugs: no history of illicit drug use  The following portions of the patient's history were reviewed and updated as appropriate: allergies, current medications, past family history, past medical history, past social history, past surgical history and problem list.  Past Medical History Past Medical History  Diagnosis Date  . Hypothyroidism   . Hypercholesterolemia   . GERD (gastroesophageal reflux disease)   . Anemia   . Thrombophlebitis     varicosities    Past Surgical History Past Surgical History  Procedure Laterality Date  . Urethral diverticulum repair  1974    Dr Jenne Pane  . Laparoscopic gastric banding    . Colon surgery      laparoscopic assisted transverse colectomy for tubular adenomatous polyp    Gynecologic History G2P0  Patient's last menstrual period was 02/01/1993. Contraception: abstinence Last Pap: 2012. Results were: normal Last mammogram: 2016. Results were: breast cyst   Obstetric History OB History  Gravida Para Term Preterm AB SAB TAB Ectopic Multiple Living  2         2    # Outcome Date GA Lbr Len/2nd Weight Sex Delivery Anes PTL Lv  2 Gravida 1972    M Vag-Spont   Y  1 Gravida 1965    M Vag-Spont   Y      Current Medications Current Outpatient Prescriptions on File Prior to Visit  Medication Sig Dispense Refill  . calcium carbonate (TUMS EX) 750 MG chewable tablet Chew 1 tablet by mouth as needed.     . Cholecalciferol (VITAMIN D-3 PO) Take 2,000 Units by mouth daily.    Marland Kitchen esomeprazole (NEXIUM) 20 MG capsule Take 20 mg by mouth daily at 12  noon.    . fluticasone (FLONASE) 50 MCG/ACT nasal spray Place 2 sprays into both nostrils daily as needed. 16 g 5  . fluticasone (FLOVENT HFA) 110 MCG/ACT inhaler Inhale 2 puffs into the lungs 2 (two) times daily. 1 Inhaler 0  . Multiple Vitamins-Minerals (MULTIVITAMIN PO) Take by mouth.    . Naproxen Sodium (ALEVE PO) Take by mouth as needed.     . Probiotic Product (ALIGN PO) Take 1 capsule by mouth daily.    Marland Kitchen SYNTHROID 125 MCG tablet Take 1 tablet (125 mcg total) by mouth daily. 90 tablet 0   No current facility-administered medications on file prior to visit.    Review of Systems Patient denies any headaches, blurred vision, shortness of breath, chest pain, abdominal pain, problems with bowel movements, urination, or intercourse.  Objective:  BP 136/63 mmHg  Pulse 63  Ht  (1.676 m)  Wt 263 lb 4.8 oz (119.432 kg)  BMI 42.52 kg/m2  LMP 02/01/1993 Physical Exam  General:  Well developed, well nourished, no acute distress. She is alert and oriented x3. Skin:  Warm and dry Neck:  Midline trachea, no thyromegaly or nodules Cardiovascular: Regular rate and rhythm, no murmur heard Lungs:  Effort normal, all lung fields clear to auscultation bilaterally Breasts:  No dominant palpable mass, retraction, or nipple discharge Abdomen:  Soft, non tender, no hepatosplenomegaly or masses Pelvic:  External genitalia is normal in  appearance.  The vagina is normal & atrophic with mild rectocele noted . The cervix is bulbous, no CMT.  Thin prep pap is not done . Uterus is felt to be normal size, shape, and contour.  No adnexal masses or tenderness noted. Extremities:  No swelling or varicosities noted Psych:  She has a normal mood and affect  Assessment:   Healthy well-woman exam S/p menopausal vaginal atrophy Obesity   Plan:  Counseled on no longer needing pap smear, but needing pelvic and breast exam every 2 years per national standards. F/U 2 years for AE, or sooner if  needed Mammogram already scheduled by PCP  Colonoscopy not due  Melody Suzan Nailer, CNM

## 2015-03-08 ENCOUNTER — Other Ambulatory Visit: Payer: Self-pay | Admitting: Internal Medicine

## 2015-03-08 ENCOUNTER — Ambulatory Visit
Admission: RE | Admit: 2015-03-08 | Discharge: 2015-03-08 | Disposition: A | Discharge status: Home / Self Care | Payer: Medicare Other | Source: Ambulatory Visit | Attending: Internal Medicine | Admitting: Internal Medicine

## 2015-03-08 DIAGNOSIS — Z13820 Encounter for screening for osteoporosis: Secondary | ICD-10-CM | POA: Diagnosis not present

## 2015-03-08 DIAGNOSIS — M8580 Other specified disorders of bone density and structure, unspecified site: Secondary | ICD-10-CM | POA: Diagnosis not present

## 2015-03-08 DIAGNOSIS — Z1231 Encounter for screening mammogram for malignant neoplasm of breast: Secondary | ICD-10-CM | POA: Insufficient documentation

## 2015-03-08 DIAGNOSIS — E2839 Other primary ovarian failure: Secondary | ICD-10-CM

## 2015-03-08 DIAGNOSIS — Z1239 Encounter for other screening for malignant neoplasm of breast: Secondary | ICD-10-CM

## 2015-03-09 ENCOUNTER — Encounter: Payer: Self-pay | Admitting: *Deleted

## 2015-03-18 ENCOUNTER — Telehealth: Payer: Self-pay | Admitting: Internal Medicine

## 2015-03-18 ENCOUNTER — Telehealth: Payer: Self-pay | Admitting: *Deleted

## 2015-03-18 NOTE — Telephone Encounter (Signed)
Pt states that she feels fine and that she has a slight dull headache, but she is going to eat breakfast. Pain level is -1, pt is not sure if it is seasonal allergies. I asked pt if she had taken anything when she experienced the chest symptoms that radiate from one side of her chest to the other and she stated "no" . Pt says she wanted Dr. Lorin PicketScott to know and if she had any advice on what she should do.

## 2015-03-18 NOTE — Telephone Encounter (Signed)
Pt states that she feels uncomfortable and is going to [facility] for evaluation.

## 2015-03-18 NOTE — Telephone Encounter (Signed)
Spoke to [place] and she informed pt needed to be seen.  Pt planning to go to urgent care for w/up.  Please follow up and confirm that pt was evaluated.

## 2015-03-18 NOTE — Telephone Encounter (Signed)
Pt needs to be seen.  Need to confirm no heart issues.  Reviewed notes. If chest pain and near syncope, I recommend evaluation (either ER or [place] urgent care)

## 2015-03-18 NOTE — Telephone Encounter (Signed)
Jodi. Jodi Harris went to urgent care and [place] and was told by the Urgent Care she needed to go to the ER. Pt states that the service there was horrible, and that because she has been driving around all day she doesn't want to go another place, she would rather follow up with Dr. Lorin PicketScott, next week. Please advise of a place we can put on your schedule for next week. I expressed to Jodi Harris that her symptoms return not to wait go to the ED immediately.

## 2015-03-18 NOTE — Telephone Encounter (Signed)
Patient Name: Jodi ModySARA Negash  DOB: 10/14/1940    Initial Comment Caller states started feeling faint last night, felt uneasy in her head and chest. has a dull headache    Nurse Assessment  Nurse: Sherilyn CooterHenry, RN, Thurmond ButtsWade Date/Time Lamount Cohen(Eastern Time): 03/18/2015 9:07:24 AM  Confirm and document reason for call. If symptomatic, describe symptoms. You must click the next button to save text entered. ---Caller states that she has felt "uneasy" in her chest since last night. It has eased up this morning. She states that it is not pain or heavy pressure. Her heartbeat feels normal to her. Denies difficulty breathing. She felt a little faint last night. She was sweaty last night when she was at church. She was standing playing hand bells and had to sit down before she passed out. She has a little bit of a headache this morning. Denies dizziness and being lightheaded. She last urinated this morning. She is able to take a deep breath without any complications. This has been something new and different for her.  Has the patient traveled out of the country within the last 30 days? ---No  Does the patient have any new or worsening symptoms? ---Yes  Will a triage be completed? ---Yes  Related visit to physician within the last 2 weeks? ---No  Does the PT have any chronic conditions? (i.e. diabetes, asthma, etc.) ---Yes  List chronic conditions. ---Hypothyroidism  Is this a behavioral health or substance abuse call? ---No     Guidelines    Guideline Title Affirmed Question Affirmed Notes  Chest Pain [1] Chest pain lasting <= 5 minutes AND [2] NO chest pain or cardiac symptoms now (Exceptions: pains lasting a few seconds)    Final Disposition User   See Physician within 24 Hours Sherilyn CooterHenry, RN, Thurmond ButtsWade    Comments  No appointments available today or tomorrow for Dr. Lorin PicketScott. I went ahead and checked for other providers [facility]. Nobody there has an opening today or tomorrow. I also checked [facility] and nothing was available  there for today either. She asked if Dr. Lorin PicketScott could order some tests for her without being seen, then if there was a problem, she could call her. I called the backline and spoke with Baird Lyonsasey. She went to check with Dr. Lorin PicketScott about this. Dr. Lorin PicketScott [facility]. I did a warm transfer to [place] for further instructions.   Referrals  GO TO FACILITY REFUSED   Disagree/Comply: Disagree  Disagree/Comply Reason: Wait and see

## 2015-03-19 NOTE — Telephone Encounter (Signed)
Pt states that she slept well last night, no other problems or symptoms since yesterday. She states that she will take the appt for 12pm on Tuesday.

## 2015-03-19 NOTE — Telephone Encounter (Signed)
Would you mind calling pt and seeing how she is doing?  I can see her on Tuesday 03/23/15 at 12:00.  If she is having any chest pain, etc - she needs evaluation today to confirm no cardiac source for her symptoms.

## 2015-03-23 ENCOUNTER — Ambulatory Visit: Payer: 59 | Admitting: Internal Medicine

## 2015-03-24 ENCOUNTER — Ambulatory Visit: Payer: 59 | Admitting: Internal Medicine

## 2015-03-24 ENCOUNTER — Ambulatory Visit (INDEPENDENT_AMBULATORY_CARE_PROVIDER_SITE_OTHER): Payer: 59 | Admitting: Internal Medicine

## 2015-03-24 ENCOUNTER — Encounter: Payer: Self-pay | Admitting: Internal Medicine

## 2015-03-24 ENCOUNTER — Telehealth: Payer: Self-pay | Admitting: *Deleted

## 2015-03-24 VITALS — BP 118/60 | HR 71 | Temp 97.7°F | Resp 18 | Ht 66.0 in | Wt 264.4 lb

## 2015-03-24 DIAGNOSIS — E7800 Pure hypercholesterolemia, unspecified: Secondary | ICD-10-CM | POA: Diagnosis not present

## 2015-03-24 DIAGNOSIS — R079 Chest pain, unspecified: Secondary | ICD-10-CM | POA: Diagnosis not present

## 2015-03-24 DIAGNOSIS — K219 Gastro-esophageal reflux disease without esophagitis: Secondary | ICD-10-CM | POA: Diagnosis not present

## 2015-03-24 DIAGNOSIS — E559 Vitamin D deficiency, unspecified: Secondary | ICD-10-CM

## 2015-03-24 NOTE — Progress Notes (Signed)
Patient ID: Jodi Harris, female   DOB: 09/26/1940, 75 y.o.   MRN: [id number]   Subjective:    Patient ID: Jodi Harris, female    DOB: 03/31/1940, 75 y.o.   MRN: [id number]  HPI  Patient here as a work in with concerns regarding some previous chest discomfort and some sob with exertion.  She has a history of hypercholesterolemia.  Reports that one week ago, she had been standing in the choir.  Did not feel as well.  Started feeling hot and clammy.  Felt as if she could pass out.  No LOC.  Went to BR.  Had bowel movement.  Noticed sharp pain in her stomach.  Sat down.  Felt better.  No acid reflux.  No other abdominal pain or cramping.  No bowel change now.  She felt weak for a few days.  She is eating normally.  Reports that prior to last week, she has felt more sluggish.  Some increased fatigue.  Reports noticing some chest discomfort several weeks after carrying in groceries.  Also has noticed some sob with exertion.  Has not been exercising.     Past Medical History  Diagnosis Date  . Hypothyroidism   . Hypercholesterolemia   . GERD (gastroesophageal reflux disease)   . Anemia   . Thrombophlebitis     varicosities   Past Surgical History  Procedure Laterality Date  . Urethral diverticulum repair  1974    Dr [name]  . Laparoscopic gastric banding    . Colon surgery      laparoscopic assisted transverse colectomy for tubular adenomatous polyp   Family History  Problem Relation Age of Onset  . Cancer Father     lymphoma  . Cancer Maternal Aunt     Breast  . Breast cancer Maternal Aunt 50  . Colon cancer      one relative on maternal side   Social History   Social History  . Marital Status: Married    Spouse Name: N/A  . Number of Children: 2  . Years of Education: N/A   Social History Main Topics  . Smoking status: Former Games developermoker  . Smokeless tobacco: Never Used     Comment: smoked as a teenager  . Alcohol Use: 0.0 oz/week    0 Standard drinks or  equivalent per week     Comment: has a glass of wine about once a month  . Drug Use: No  . Sexual Activity: No   Other Topics Concern  . None   Social History Narrative    Outpatient Encounter Prescriptions as of 03/24/2015  Medication Sig  . calcium carbonate (TUMS EX) 750 MG chewable tablet Chew 1 tablet by mouth as needed.   . Cholecalciferol (VITAMIN D-3 PO) Take 2,000 Units by mouth daily.  Marland Kitchen. esomeprazole (NEXIUM) 20 MG capsule Take 20 mg by mouth daily at 12 noon.  . fluticasone (FLONASE) 50 MCG/ACT nasal spray Place 2 sprays into both nostrils daily as needed.  . fluticasone (FLOVENT HFA) 110 MCG/ACT inhaler Inhale 2 puffs into the lungs 2 (two) times daily.  . Multiple Vitamins-Minerals (MULTIVITAMIN PO) Take by mouth.  . Naproxen Sodium (ALEVE PO) Take by mouth as needed.   . Probiotic Product (ALIGN PO) Take 1 capsule by mouth daily.  Marland Kitchen. SYNTHROID 125 MCG tablet Take 1 tablet (125 mcg total) by mouth daily.   No facility-administered encounter medications on file as of 03/24/2015.    Review of Systems  Constitutional: Positive for  fatigue. Negative for appetite change.  HENT: Negative for congestion and sinus pressure.   Respiratory: Positive for shortness of breath (some sob with exertion. ). Negative for cough and chest tightness.   Cardiovascular: Positive for chest pain. Negative for palpitations.  Gastrointestinal: Positive for abdominal pain. Negative for nausea, vomiting and diarrhea.  Genitourinary: Negative for dysuria and difficulty urinating.  Musculoskeletal: Negative for myalgias and joint swelling.  Skin: Negative for color change and rash.  Neurological: Negative for dizziness, light-headedness and headaches.  Psychiatric/Behavioral: Negative for dysphoric mood and agitation.       Objective:    Physical Exam  Constitutional: She appears well-developed and well-nourished. No distress.  HENT:  Nose: Nose normal.  Mouth/Throat: Oropharynx is clear and  moist.  Neck: Neck supple. No thyromegaly present.  Cardiovascular: Normal rate and regular rhythm.   Pulmonary/Chest: Breath sounds normal. No respiratory distress. She has no wheezes.  Abdominal: Soft. Bowel sounds are normal. There is no tenderness.  Musculoskeletal: She exhibits no edema or tenderness.  Lymphadenopathy:    She has no cervical adenopathy.  Skin: No rash noted. No erythema.  Psychiatric: She has a normal mood and affect. Her behavior is normal.    BP 118/60 mmHg  Pulse 71  Temp(Src) 97.7 F (36.5 C) (Oral)  Resp 18  Ht  (1.676 m)  Wt 264 lb 6 oz (119.92 kg)  BMI 42.69 kg/m2  SpO2 98%  LMP 02/01/1993 Wt Readings from Last 3 Encounters:  03/24/15 264 lb 6 oz (119.92 kg)  02/25/15 263 lb 4.8 oz (119.432 kg)  01/29/15 259 lb 12.8 oz (117.845 kg)     Lab Results  Component Value Date   WBC 4.9 03/26/2014   HGB 12.8 03/26/2014   HCT 39.3 03/26/2014   PLT 229.0 03/26/2014   GLUCOSE 86 10/01/2014   CHOL 180 10/01/2014   TRIG 66.0 10/01/2014   HDL 50.20 10/01/2014   LDLDIRECT 129.8 02/15/2012   LDLCALC 116* 10/01/2014   ALT 10 10/01/2014   AST 17 10/01/2014   NA 140 10/01/2014   K 4.1 10/01/2014   CL 104 10/01/2014   CREATININE 0.73 10/01/2014   BUN 19 10/01/2014   CO2 31 10/01/2014   TSH 1.80 03/26/2014        Assessment & Plan:   Problem List Items Addressed This Visit    Chest pain - Primary    Had the near syncopal episode last week.  May have been vasovagal response.  Has had some intermittent episodes of brief chest pain as outlined.  Also has noticed some increased sob with exertion.  Given symptoms, EKG obtained and revealed SR with no acute ischemic changes.  Discussed further cardiac w/up.  She is in agreement.  Refer to cardiology for further evaluation and question of need for further cardiac w/up (stress testing, echo, etc).        Relevant Orders   EKG 12-Lead (Completed)   Ambulatory referral to Cardiology   CBC with  Differential/Platelet   Basic metabolic panel   GERD (gastroesophageal reflux disease)    On nexium.  States symptoms controlled.        Relevant Orders   TSH   Hypercholesterolemia    Low cholesterol diet and exercise.  Follow lipid panel.        Relevant Orders   Lipid panel   Hepatic function panel   Vitamin D deficiency   Relevant Orders   VITAMIN D 25 Hydroxy (Vit-D Deficiency, Fractures)  I spent 25 minutes with the patient and more than 50% of the time was spent in consultation regarding the above.     Einar Pheasant, MD

## 2015-03-24 NOTE — Progress Notes (Signed)
Pre-visit discussion using our clinic review tool. No additional management support is needed unless otherwise documented below in the visit note.  

## 2015-03-25 ENCOUNTER — Other Ambulatory Visit: Payer: 59

## 2015-03-25 ENCOUNTER — Encounter: Payer: Self-pay | Admitting: Internal Medicine

## 2015-03-25 DIAGNOSIS — R079 Chest pain, unspecified: Secondary | ICD-10-CM | POA: Insufficient documentation

## 2015-03-25 NOTE — Assessment & Plan Note (Signed)
On nexium.  States symptoms controlled.

## 2015-03-25 NOTE — Assessment & Plan Note (Signed)
Had the near syncopal episode last week.  May have been vasovagal response.  Has had some intermittent episodes of brief chest pain as outlined.  Also has noticed some increased sob with exertion.  Given symptoms, EKG obtained and revealed SR with no acute ischemic changes.  Discussed further cardiac w/up.  She is in agreement.  Refer to cardiology for further evaluation and question of need for further cardiac w/up (stress testing, echo, etc).

## 2015-03-25 NOTE — Assessment & Plan Note (Signed)
Low cholesterol diet and exercise.  Follow lipid panel.   

## 2015-05-20 ENCOUNTER — Ambulatory Visit: Payer: Medicare Other | Attending: Orthopedic Surgery | Admitting: Occupational Therapy

## 2015-05-20 DIAGNOSIS — M6281 Muscle weakness (generalized): Secondary | ICD-10-CM | POA: Insufficient documentation

## 2015-05-20 DIAGNOSIS — M6530 Trigger finger, unspecified finger: Secondary | ICD-10-CM | POA: Diagnosis present

## 2015-05-20 DIAGNOSIS — M79642 Pain in left hand: Secondary | ICD-10-CM

## 2015-05-20 NOTE — Therapy (Signed)
[facility] [address] [place], [place], [address] Phone: [phone]   Fax:  [phone]  Occupational Therapy Treatment  Patient Details  Name: Jodi Harris MRN: [id number] Date of Birth: 07/10/1940 Referring Provider: Rosita KeaMenz  Encounter Date: 05/20/2015      OT End of Session - 05/20/15 1414    Visit Number 1   Number of Visits 6   Date for OT Re-Evaluation 06/10/15   OT Start Time 1301   OT Stop Time 1352   OT Time Calculation (min) 51 min   Activity Tolerance Patient tolerated treatment well   Behavior During Therapy [facility] for tasks assessed/performed      Past Medical History  Diagnosis Date  . Hypothyroidism   . Hypercholesterolemia   . GERD (gastroesophageal reflux disease)   . Anemia   . Thrombophlebitis     varicosities    Past Surgical History  Procedure Laterality Date  . Urethral diverticulum repair  1974    Dr [name]  . Laparoscopic gastric banding    . Colon surgery      laparoscopic assisted transverse colectomy for tubular adenomatous polyp    There were no vitals filed for this visit.      Subjective Assessment - 05/20/15 1401    Subjective  My thumb pain and triggering started about month ago - after I did one day the bells - and need to hold the large on in L and go around with the R hand - difficulty with gripping , open meds bottles , pull up pants , dressing - and thumb just tender  and weak    Patient Stated Goals Want to get my thumb like it was before - need to get back to gardening - have no pain and more strength    Currently in Pain? Yes   Pain Score 2    Pain Location Finger (Comment which one)   Pain Orientation Left   Pain Descriptors / Indicators Aching   Pain Type Acute pain   Pain Onset More than a month ago            [facility] OT Assessment - 05/20/15 0001    Assessment   Diagnosis L trigger thumb    Referring Provider Rosita KeaMenz   Onset Date 04/20/15   Home  Environment   Lives With Spouse   Prior Function   Level of Independence Independent   Vocation Retired   Leisure R Event organiserhand domiinant - likes to garden, read, some what on laptop, house work , Pharmacist, communityring bells at [employer] - but ends this [employer]    Strength   Right Hand Grip (lbs) 40   Right Hand Lateral Pinch 14 lbs   Right Hand 3 Point Pinch 11 lbs   Left Hand Grip (lbs) 30   Left Hand Lateral Pinch 7 lbs   Left Hand 3 Point Pinch 3 lbs   Right Hand AROM   R Thumb Radial ABduction/ADduction 0-55 50   R Thumb Palmar ABduction/ADduction 0-45 60   Left Hand AROM   L Thumb Radial ADduction/ABduction 0-55 50   L Thumb Palmar ADduction/ABduction 0-45 55           Contrast done 3 min heat and 1 min ice - x 2 and heat again 3 min  Ed on HEP - see pt instruction   pt do have glove with padding in palm that she use for bells - now since seen MD  OT Education - 06-13-2015 1413    Education provided Yes   Education Details HEP and act modifications    Person(s) Educated Patient   Methods Explanation;Demonstration;Tactile cues;Verbal cues;Handout   Comprehension Verbal cues required;Returned demonstration;Verbalized understanding          OT Short Term Goals - 06/13/2015 1418    OT SHORT TERM GOAL #1   Title Pain decrease on PRWHE by at least 10 points    Baseline PRWHE at eval 18/50 for pain    Time 3   Period Weeks   Status New   OT SHORT TERM GOAL #2   Title Pt report decrease triggering of L thumb and increase ROM at L thumb    Baseline Pt can trigger thumb every time when flexing thumb     Time 2   Period Weeks   Status New           OT Long Term Goals - 06-13-2015 1419    OT LONG TERM GOAL #1   Title Pt show increase functional use in PRWHE by 10 points    Baseline Function at eval on PRHWE was 16.5/50   Time 4   Period Weeks   Status New   OT LONG TERM GOAL #2   Title Lat and 3 point grip improve with 3-5 lbs to report able to pull up  pants , open medicine   Baseline Lat 7 L , R 14lbs; 3 point grip R 11, L 3 lbs    Time 4   Period Weeks   Status New               Plan - June 13, 2015 1414    Clinical Impression Statement Pt present at eval with L trigger thumb since about month ago - she do report triggering , acheness in thumb to base and sensory changes in tip  - pt show increase pain ,  decrease ROM at IP and RA , decrease prehension strenght - and functional use    Rehab Potential Good   OT Frequency 2x / week   OT Duration 4 weeks   OT Treatment/Interventions Self-care/ADL training;Cryotherapy;Ultrasound;Iontophoresis;Contrast Bath;DME and/or AE instruction;Manual Therapy;Patient/family education;Therapeutic exercises   Plan  Assess progress with HEP    OT Home Exercise Plan see pt instructnion    Consulted and Agree with Plan of Care Patient      Patient will benefit from skilled therapeutic intervention in order to improve the following deficits and impairments:  Impaired flexibility, Impaired sensation, Pain, Impaired UE functional use, Decreased strength, Decreased knowledge of use of DME, Decreased knowledge of precautions, Decreased range of motion  Visit Diagnosis: Pain in left hand - Plan: Ot plan of care cert/re-cert  Trigger finger of left hand - Plan: Ot plan of care cert/re-cert  Muscle weakness (generalized) - Plan: Ot plan of care cert/re-cert      G-Codes - 06-13-2015 1422    Functional Assessment Tool Used PRWHE, ROM , strength, pain , clinical judgement   Functional Limitation Self care   Self Care Current Status (R6045) At least 20 percent but less than 40 percent impaired, limited or restricted   Self Care Goal Status (W0981) At least 1 percent but less than 20 percent impaired, limited or restricted      Problem List Patient Active Problem List   Diagnosis Date Noted  . Chest pain 03/25/2015  . URI (upper respiratory infection) 12/28/2014  . Acute bronchitis 04/27/2014  .  Health care maintenance 04/06/2014  .  Stress 10/03/2013  . Obesity 10/03/2013  . Left hip pain 09/22/2013  . Skin lesions 04/13/2013  . Hypothyroidism 02/04/2012  . History of colon polyps 02/04/2012  . GERD (gastroesophageal reflux disease) 02/04/2012  . Hypercholesterolemia 02/04/2012  . Vitamin D deficiency 02/04/2012    Oletta Cohn OTR/L,CLT  05/20/2015, 2:26 PM  [facility] [address], [place], [address] Phone: [phone]   Fax:  [phone]  Name: Jodi Harris MRN: [id number] Date of Birth: November 06, 1940

## 2015-05-20 NOTE — Patient Instructions (Signed)
Pt ed on contrast during day - 2 x day - followed by AROM for PA and RA  8 -10 reps  Can do ice massage over A1 pulleys at thumb during day  Modify activities   use large joints ( palms and forearms)  buildup handles  Avoid tight or sustained grip  Light grip   Do not let thumb trigger

## 2015-05-27 ENCOUNTER — Ambulatory Visit: Payer: Medicare Other | Admitting: Occupational Therapy

## 2015-05-27 DIAGNOSIS — M6530 Trigger finger, unspecified finger: Secondary | ICD-10-CM

## 2015-05-27 DIAGNOSIS — M79642 Pain in left hand: Secondary | ICD-10-CM

## 2015-05-27 DIAGNOSIS — M6281 Muscle weakness (generalized): Secondary | ICD-10-CM

## 2015-05-27 NOTE — Therapy (Signed)
[place] [facility] [address], [place], [address] Phone: [phone]   Fax:  [phone]  Occupational Therapy Treatment  Patient Details  Name: Jodi Harris MRN: [id number] Date of Birth: 08/04/1940 Referring Provider: Rosita Kea  Encounter Date: 05/27/2015      OT End of Session - 05/27/15 1329    OT Start Time 1256   OT Stop Time 1346   OT Time Calculation (min) 50 min   Activity Tolerance Patient tolerated treatment well   Behavior During Therapy [facility] for tasks assessed/performed      Past Medical History  Diagnosis Date  . Hypothyroidism   . Hypercholesterolemia   . GERD (gastroesophageal reflux disease)   . Anemia   . Thrombophlebitis     varicosities    Past Surgical History  Procedure Laterality Date  . Urethral diverticulum repair  1974    Dr Jenne Pane  . Laparoscopic gastric banding    . Colon surgery      laparoscopic assisted transverse colectomy for tubular adenomatous polyp    There were no vitals filed for this visit.      Subjective Assessment - 05/27/15 1259    Subjective  Still painfull -you did hot and cold - tried not to pop it -    Currently in Pain? Yes   Pain Score 3    Pain Location Finger (Comment which one)   Pain Orientation Left   Pain Descriptors / Indicators Aching                      OT Treatments/Exercises (OP) - 05/27/15 0001    Iontophoresis   Type of Iontophoresis Dexamethasone   Location A1v pulley at thumb    Dose 2.0 current - 19 min    Time 19   LUE Contrast Bath   Time 10 minutes   Comments At [facility] to decrease pain    Splinting   Splinting Fitted with neoprene thumb splint to use for functoional use                 OT Education - 05/27/15 1329    Education provided Yes   Education Details HEP   Person(s) Educated Patient   Methods Explanation;Demonstration;Tactile cues;Verbal cues   Comprehension Returned  demonstration;Verbalized understanding;Verbal cues required          OT Short Term Goals - 05/20/15 1418    OT SHORT TERM GOAL #1   Title Pain decrease on PRWHE by at least 10 points    Baseline PRWHE at eval 18/50 for pain    Time 3   Period Weeks   Status New   OT SHORT TERM GOAL #2   Title Pt report decrease triggering of L thumb and increase ROM at L thumb    Baseline Pt can trigger thumb every time when flexing thumb     Time 2   Period Weeks   Status New           OT Long Term Goals - 05/20/15 1419    OT LONG TERM GOAL #1   Title Pt show increase functional use in PRWHE by 10 points    Baseline Function at eval on PRHWE was 16.5/50   Time 4   Period Weeks   Status New   OT LONG TERM GOAL #2   Title Lat and 3 point grip improve with 3-5 lbs to report able to pull up pants , open medicine  Baseline Lat 7 L , R 14lbs; 3 point grip R 11, L 3 lbs    Time 4   Period Weeks   Status New               Plan - 05/27/15 1334    Clinical Impression Statement Pt cont to show triggeing at thumb - tenderness over A1 pulley at thumb and achiness at thumb - pt fitted with [facility] neoprene splint to wear when using thumb - ionto done this date  with dexamethasone   Rehab Potential Good   OT Frequency 2x / week   OT Duration 4 weeks   OT Treatment/Interventions Self-care/ADL training;Cryotherapy;Ultrasound;Iontophoresis;Contrast Bath;DME and/or AE instruction;Manual Therapy;Patient/family education;Therapeutic exercises   Plan assess pain and any changes   OT Home Exercise Plan see pt instructnion    Consulted and Agree with Plan of Care Patient      Patient will benefit from skilled therapeutic intervention in order to improve the following deficits and impairments:  Impaired flexibility, Impaired sensation, Pain, Impaired UE functional use, Decreased strength, Decreased knowledge of use of DME, Decreased knowledge of precautions, Decreased range of motion  Visit  Diagnosis: Pain in left hand  Trigger finger of left hand  Muscle weakness (generalized)    Problem List Patient Active Problem List   Diagnosis Date Noted  . Chest pain 03/25/2015  . URI (upper respiratory infection) 12/28/2014  . Acute bronchitis 04/27/2014  . Health care maintenance 04/06/2014  . Stress 10/03/2013  . Obesity 10/03/2013  . Left hip pain 09/22/2013  . Skin lesions 04/13/2013  . Hypothyroidism 02/04/2012  . History of colon polyps 02/04/2012  . GERD (gastroesophageal reflux disease) 02/04/2012  . Hypercholesterolemia 02/04/2012  . Vitamin D deficiency 02/04/2012    Oletta CohnuPreez, Tifany Hirsch OTR/L,CLT 05/27/2015, 1:39 PM  [place] [facility] [address] [place], [place], [address] Phone: [phone]   Fax:  [phone]  Name: Jodi Harris MRN: [id number] Date of Birth: 02/14/1940

## 2015-05-27 NOTE — Patient Instructions (Signed)
Same than last time - use contrast - splint now - ice massage at needed  Modify how lifting or gripping

## 2015-05-31 ENCOUNTER — Ambulatory Visit: Payer: Medicare Other | Admitting: Occupational Therapy

## 2015-05-31 DIAGNOSIS — M79642 Pain in left hand: Secondary | ICD-10-CM

## 2015-05-31 DIAGNOSIS — M6281 Muscle weakness (generalized): Secondary | ICD-10-CM

## 2015-05-31 DIAGNOSIS — M6530 Trigger finger, unspecified finger: Secondary | ICD-10-CM

## 2015-05-31 NOTE — Patient Instructions (Signed)
Pt to cont with [facility] neoprene splint

## 2015-05-31 NOTE — Therapy (Signed)
[facility] PH[facility]ORTS [facility]ICINE [address] [place], [address] Phone: [phone]   Fax:  [phone]  Occupational Therapy Treatment  Patient Details  Name: Jodi Harris MRN: [id number] Date of Birth: 1940-01-20 Referring Provider: Rosita Kea  Encounter Date: 05/31/2015      OT End of Session - 05/31/15 0920    Visit Number 3   Number of Visits 6   Date for OT Re-Evaluation 06/10/15   OT Start Time 0900   OT Stop Time 0940   OT Time Calculation (min) 40 min   Activity Tolerance Patient tolerated treatment well   Behavior During Therapy [facility] for tasks assessed/performed      Past Medical History  Diagnosis Date  . Hypothyroidism   . Hypercholesterolemia   . GERD (gastroesophageal reflux disease)   . Anemia   . Thrombophlebitis     varicosities    Past Surgical History  Procedure Laterality Date  . Urethral diverticulum repair  1974    Dr Jenne Pane  . Laparoscopic gastric banding    . Colon surgery      laparoscopic assisted transverse colectomy for tubular adenomatous polyp    There were no vitals filed for this visit.      Subjective Assessment - 05/31/15 0918    Subjective  I have been wearing the soft splint most all the time - because not easy to put on and off - pain at thumb better- not as sore and can pull up my pants easier   Patient Stated Goals Want to get my thumb like it was before - need to get back to gardening - have no pain and more strength    Currently in Pain? Yes   Pain Score 2    Pain Location Finger (Comment which one)   Pain Orientation Left   Pain Descriptors / Indicators Aching   Pain Type Acute pain                      OT Treatments/Exercises (OP) - 05/31/15 0001    Iontophoresis   Type of Iontophoresis Dexamethasone   Location A1v pulley at thumb    Dose 2.0 current - 19 min    Time 19      AROM for PA and RA of thumb assess   and IP extention -  Splint  check if not to tight - pt to only wear when using hand   rest or sleeping no wearing           OT Education - 05/31/15 0920    Education provided Yes   Education Details HEP and modifications    Person(s) Educated Patient   Methods Explanation;Demonstration;Tactile cues;Verbal cues   Comprehension Verbalized understanding;Returned demonstration          OT Short Term Goals - 05/20/15 1418    OT SHORT TERM GOAL #1   Title Pain decrease on PRWHE by at least 10 points    Baseline PRWHE at eval 18/50 for pain    Time 3   Period Weeks   Status New   OT SHORT TERM GOAL #2   Title Pt report decrease triggering of L thumb and increase ROM at L thumb    Baseline Pt can trigger thumb every time when flexing thumb     Time 2   Period Weeks   Status New           OT Long Term Goals - 05/20/15  1419    OT LONG TERM GOAL #1   Title Pt show increase functional use in PRWHE by 10 points    Baseline Function at eval on PRHWE was 16.5/50   Time 4   Period Weeks   Status New   OT LONG TERM GOAL #2   Title Lat and 3 point grip improve with 3-5 lbs to report able to pull up pants , open medicine   Baseline Lat 7 L , R 14lbs; 3 point grip R 11, L 3 lbs    Time 4   Period Weeks   Status New               Plan - 05/31/15 0920    Clinical Impression Statement Pt report pain decrease at thumb since last time - still triggering but not by purpose - wearing neoprene splint most all the time during day    Rehab Potential Good   OT Frequency 2x / week   OT Duration 4 weeks   OT Treatment/Interventions Self-care/ADL training;Cryotherapy;Ultrasound;Iontophoresis;Contrast Bath;DME and/or AE instruction;Manual Therapy;Patient/family education;Therapeutic exercises   Plan cont to assess pain and progress   OT Home Exercise Plan see pt instructnion    Consulted and Agree with Plan of Care Patient      Patient will benefit from skilled therapeutic intervention in order to improve  the following deficits and impairments:  Impaired flexibility, Impaired sensation, Pain, Impaired UE functional use, Decreased strength, Decreased knowledge of use of DME, Decreased knowledge of precautions, Decreased range of motion  Visit Diagnosis: Pain in left hand  Trigger finger of left hand  Muscle weakness (generalized)    Problem List Patient Active Problem List   Diagnosis Date Noted  . Chest pain 03/25/2015  . URI (upper respiratory infection) 12/28/2014  . Acute bronchitis 04/27/2014  . Health care maintenance 04/06/2014  . Stress 10/03/2013  . Obesity 10/03/2013  . Left hip pain 09/22/2013  . Skin lesions 04/13/2013  . Hypothyroidism 02/04/2012  . History of colon polyps 02/04/2012  . GERD (gastroesophageal reflux disease) 02/04/2012  . Hypercholesterolemia 02/04/2012  . Vitamin D deficiency 02/04/2012    Oletta CohnuPreez, Jodi Harris OTR/L,CLT 05/31/2015, 9:50 AM  [facility] [address] [place], [place], [address] Phone: [phone]   Fax:  [phone]  Name: Jodi Harris MRN: [id number] Date of Birth: 03/06/1940

## 2015-06-04 ENCOUNTER — Ambulatory Visit: Payer: Medicare Other | Admitting: Occupational Therapy

## 2015-06-04 ENCOUNTER — Encounter: Payer: Self-pay | Admitting: Occupational Therapy

## 2015-06-04 DIAGNOSIS — M79642 Pain in left hand: Secondary | ICD-10-CM | POA: Diagnosis not present

## 2015-06-04 DIAGNOSIS — M6530 Trigger finger, unspecified finger: Secondary | ICD-10-CM

## 2015-06-04 DIAGNOSIS — M6281 Muscle weakness (generalized): Secondary | ICD-10-CM

## 2015-06-04 NOTE — Therapy (Signed)
[facility] [address] [place], [place], [address] Phone: [phone]   Fax:  [phone]  Occupational Therapy Treatment  Patient Details  Name: Jodi Harris MRN: [id number] Date of Birth: 01/20/1940 Referring Provider: Rosita KeaMenz  Encounter Date: 06/04/2015      OT End of Session - 06/04/15 1213    Visit Number 4   Number of Visits 6   Date for OT Re-Evaluation 06/10/15   OT Start Time 1133   OT Stop Time 1207   OT Time Calculation (min) 34 min   Equipment Utilized During Treatment Iontophoresis   Activity Tolerance Patient tolerated treatment well   Behavior During Therapy [facility] for tasks assessed/performed      Past Medical History  Diagnosis Date  . Hypothyroidism   . Hypercholesterolemia   . GERD (gastroesophageal reflux disease)   . Anemia   . Thrombophlebitis     varicosities    Past Surgical History  Procedure Laterality Date  . Urethral diverticulum repair  1974    Dr [name]  . Laparoscopic gastric banding    . Colon surgery      laparoscopic assisted transverse colectomy for tubular adenomatous polyp    There were no vitals filed for this visit.      Subjective Assessment - 06/04/15 1143    Subjective  Pt reports that she has been wearing her splint left thumb/hand most of the time removing at noght. She rates her pain as 0/10 today and states that it may get up to 2/10 with triggering of the thumb when she's using it functionally. "I think I'll have to have a surgery anyway, and that this isn't going to help"   Patient Stated Goals Want to get my thumb like it was before - need to get back to gardening - have no pain and more strength    Currently in Pain? No/denies   Pain Score 0-No pain                      OT Treatments/Exercises (OP) - 06/04/15 0001    ADLs   ADL Comments Pt verbal education on purpose of Iontophoresis, trigger thumb (flexor tendon and pulley  system of thumb). Modifications for hand.   Iontophoresis   Type of Iontophoresis Dexamethasone   Location A1v pulley at thumb    Dose 2.0 current - 19 min    Time 19   Splinting   Splinting Review neoprene thumb spica splint use, care and precautions left for functional activity.   Manual Therapy   Manual Therapy Edema management  Left thumb, retrograde massage and edema management   Manual therapy comments Pt is tender over MP noted   Edema Management Retrograde and cross friction massage x5 min left volar thumb                OT Education - 06/04/15 1212    Education provided Yes   Education Details Reviewed home program and splint use for functional activity left   Person(s) Educated Patient   Methods Explanation;Demonstration   Comprehension Verbalized understanding;Returned demonstration          OT Short Term Goals - 05/20/15 1418    OT SHORT TERM GOAL #1   Title Pain decrease on PRWHE by at least 10 points    Baseline PRWHE at eval 18/50 for pain    Time 3   Period Weeks   Status New   OT SHORT  TERM GOAL #2   Title Pt report decrease triggering of L thumb and increase ROM at L thumb    Baseline Pt can trigger thumb every time when flexing thumb     Time 2   Period Weeks   Status New           OT Long Term Goals - 05/20/15 1419    OT LONG TERM GOAL #1   Title Pt show increase functional use in PRWHE by 10 points    Baseline Function at eval on PRHWE was 16.5/50   Time 4   Period Weeks   Status New   OT LONG TERM GOAL #2   Title Lat and 3 point grip improve with 3-5 lbs to report able to pull up pants , open medicine   Baseline Lat 7 L , R 14lbs; 3 point grip R 11, L 3 lbs    Time 4   Period Weeks   Status New               Plan - 06/04/15 1214    Clinical Impression Statement Pt appears frustrated that thumb is sore and cont to trigger at times. She cont to wear neoprene thumb spica during functional activites and removes at noc. Pt  tolerated iontophoresis with dexamethasone today to volar thumb A1 pulley area w/o redness or any adverse reactions noted.   Rehab Potential Good   OT Frequency 2x / week   OT Duration 4 weeks   OT Treatment/Interventions Self-care/ADL training;Cryotherapy;Ultrasound;Iontophoresis;Contrast Bath;DME and/or AE instruction;Manual Therapy;Patient/family education;Therapeutic exercises   Plan Assess pain, Iontophoresis left thumb (4th treatment session of Ionto)   Consulted and Agree with Plan of Care Patient      Patient will benefit from skilled therapeutic intervention in order to improve the following deficits and impairments:  Impaired flexibility, Impaired sensation, Pain, Impaired UE functional use, Decreased strength, Decreased knowledge of use of DME, Decreased knowledge of precautions, Decreased range of motion  Visit Diagnosis: Pain in left hand  Trigger finger of left hand  Muscle weakness (generalized)    Problem List Patient Active Problem List   Diagnosis Date Noted  . Chest pain 03/25/2015  . URI (upper respiratory infection) 12/28/2014  . Acute bronchitis 04/27/2014  . Health care maintenance 04/06/2014  . Stress 10/03/2013  . Obesity 10/03/2013  . Left hip pain 09/22/2013  . Skin lesions 04/13/2013  . Hypothyroidism 02/04/2012  . History of colon polyps 02/04/2012  . GERD (gastroesophageal reflux disease) 02/04/2012  . Hypercholesterolemia 02/04/2012  . Vitamin D deficiency 02/04/2012    Mariam Dollar Beth Dixon, OTR/L 06/04/2015, 12:18 PM  [facility] PHYSICAL AND SPORTS MEDICINE [address], [place], [address] Phone: [phone]   Fax:  [phone]  Name: Jodi Harris MRN: [id number] Date of Birth: July 15, 1940

## 2015-06-08 ENCOUNTER — Ambulatory Visit: Payer: Medicare Other | Admitting: Occupational Therapy

## 2015-06-10 ENCOUNTER — Encounter: Payer: Medicare Other | Admitting: Occupational Therapy

## 2015-06-11 ENCOUNTER — Encounter: Payer: Self-pay | Admitting: Occupational Therapy

## 2015-06-11 ENCOUNTER — Ambulatory Visit: Payer: Medicare Other | Attending: Orthopedic Surgery | Admitting: Occupational Therapy

## 2015-06-11 DIAGNOSIS — M6281 Muscle weakness (generalized): Secondary | ICD-10-CM

## 2015-06-11 DIAGNOSIS — M6530 Trigger finger, unspecified finger: Secondary | ICD-10-CM | POA: Diagnosis present

## 2015-06-11 DIAGNOSIS — M79642 Pain in left hand: Secondary | ICD-10-CM | POA: Insufficient documentation

## 2015-06-11 NOTE — Therapy (Signed)
[facility]ORTS MEDICINE [address] [place], [address] Phone: [phone]   Fax:  [phone]  Occupational Therapy Treatment  Patient Details  Name: Arrietty Dercole Gilberto MRN: [id number] Date of Birth: 08-Aug-1940 Referring Provider: Rosita Kea  Encounter Date: 06/11/2015      OT End of Session - 06/11/15 1121    Visit Number 5   Number of Visits 6   Date for OT Re-Evaluation 06/17/15   OT Start Time 1048   OT Stop Time 1118   OT Time Calculation (min) 30 min   Equipment Utilized During Treatment Iontophoresis   Activity Tolerance Patient tolerated treatment well   Behavior During Therapy [facility] for tasks assessed/performed      Past Medical History  Diagnosis Date  . Hypothyroidism   . Hypercholesterolemia   . GERD (gastroesophageal reflux disease)   . Anemia   . Thrombophlebitis     varicosities    Past Surgical History  Procedure Laterality Date  . Urethral diverticulum repair  1974    Dr Jenne Pane  . Laparoscopic gastric banding    . Colon surgery      laparoscopic assisted transverse colectomy for tubular adenomatous polyp    There were no vitals filed for this visit.      Subjective Assessment - 06/11/15 1057    Subjective  Pt reports decreased pain and soreness left thumb rating it as 1/10. "It still catches though, but doesn't hurt" "I may just need to get it taken care of" surgically.    Patient Stated Goals Want to get my thumb like it was before - need to get back to gardening - have no pain and more strength    Currently in Pain? Yes   Pain Score 1    Pain Location Finger (Comment which one)   Pain Orientation Left   Pain Descriptors / Indicators Aching   Pain Type Acute pain   Pain Onset More than a month ago   Multiple Pain Sites Yes   Pain Score 8  When walking   Pain Location Hip   Pain Orientation Right   Pain Descriptors / Indicators Sore;Aching   Pain Type Acute pain   Pain Onset 1 to  4 weeks ago   Aggravating Factors  Walking   Pain Relieving Factors Sitting                      OT Treatments/Exercises (OP) - 06/11/15 0001    ADLs   ADL Comments Reviewe dpurpose for iontophoresis left thumb, trigger thumb (flexor tendon  & pulley system). Modifications to left hand/thumb, wear neoprene splint during functional activity.   Iontophoresis   Type of Iontophoresis Dexamethasone   Location A1v pulley at thumb    Dose 2.0 current - 19 min    Time 19   Splinting   Splinting Review neoprene thumb spica splint use, care and precautions left for functional activity.   Manual Therapy   Manual Therapy Edema management   Manual therapy comments Pt is tender over MP noted   Edema Management Retrograde and cross friction massage x8 min left volar thumb                OT Education - 06/11/15 1117    Education provided Yes   Education Details Reviewed splint use, home program.   Person(s) Educated Patient   Methods Explanation;Demonstration   Comprehension Verbalized understanding;Returned demonstration  OT Short Term Goals - 05/20/15 1418    OT SHORT TERM GOAL #1   Title Pain decrease on PRWHE by at least 10 points    Baseline PRWHE at eval 18/50 for pain    Time 3   Period Weeks   Status New   OT SHORT TERM GOAL #2   Title Pt report decrease triggering of L thumb and increase ROM at L thumb    Baseline Pt can trigger thumb every time when flexing thumb     Time 2   Period Weeks   Status New           OT Long Term Goals - 05/20/15 1419    OT LONG TERM GOAL #1   Title Pt show increase functional use in PRWHE by 10 points    Baseline Function at eval on PRHWE was 16.5/50   Time 4   Period Weeks   Status New   OT LONG TERM GOAL #2   Title Lat and 3 point grip improve with 3-5 lbs to report able to pull up pants , open medicine   Baseline Lat 7 L , R 14lbs; 3 point grip R 11, L 3 lbs    Time 4   Period Weeks   Status New                Plan - 06/11/15 1122    Clinical Impression Statement Pt is reporting decreased soreness left thumb thenar emminence area. She cont to experience triggering of thumb but "it's not painful & the soreness is better". Pt cont to wear neoprene splint during functional activites and removes at noc. She had to cancel an appointment earlier this week, & is hopeful that she may have more relief with treatment session/Iontophoresis 2x/week if possible.    Rehab Potential Good   OT Frequency 2x / week   OT Duration 4 weeks   OT Treatment/Interventions Self-care/ADL training;Cryotherapy;Ultrasound;Iontophoresis;Contrast Bath;DME and/or AE instruction;Manual Therapy;Patient/family education;Therapeutic exercises   Plan Assess goals, Ionto, assess soreness/pain left thumb. (5th treatment session of Ionto).   OT Home Exercise Plan see pt instructnion    Consulted and Agree with Plan of Care Patient      Patient will benefit from skilled therapeutic intervention in order to improve the following deficits and impairments:  Impaired flexibility, Impaired sensation, Pain, Impaired UE functional use, Decreased strength, Decreased knowledge of use of DME, Decreased knowledge of precautions, Decreased range of motion  Visit Diagnosis: Pain in left hand  Trigger finger of left hand  Muscle weakness (generalized)    Problem List Patient Active Problem List   Diagnosis Date Noted  . Chest pain 03/25/2015  . URI (upper respiratory infection) 12/28/2014  . Acute bronchitis 04/27/2014  . Health care maintenance 04/06/2014  . Stress 10/03/2013  . Obesity 10/03/2013  . Left hip pain 09/22/2013  . Skin lesions 04/13/2013  . Hypothyroidism 02/04/2012  . History of colon polyps 02/04/2012  . GERD (gastroesophageal reflux disease) 02/04/2012  . Hypercholesterolemia 02/04/2012  . Vitamin D deficiency 02/04/2012    Mariam DollarBarnhill, Reverie Vaquera Beth Dixon, OTR/L 06/11/2015, 11:29 AM  [facility] [address] [place], [place], [address] Phone: [phone]   Fax:  [phone]  Name: Sheliah PlaneSara Compton Sproles MRN: [id number] Date of Birth: 02/18/1940

## 2015-06-17 ENCOUNTER — Ambulatory Visit: Payer: Medicare Other | Admitting: Occupational Therapy

## 2015-06-17 DIAGNOSIS — M6530 Trigger finger, unspecified finger: Secondary | ICD-10-CM

## 2015-06-17 DIAGNOSIS — M79642 Pain in left hand: Secondary | ICD-10-CM | POA: Diagnosis not present

## 2015-06-17 NOTE — Patient Instructions (Signed)
Cont with same and make appt with Dr Rosita KeaMenz

## 2015-06-17 NOTE — Therapy (Signed)
[place] [facility] SPORTS MEDICINE [address] [place], [address] Phone: [phone]   Fax:  [phone]  Occupational Therapy Treatment and discharge  Patient Details  Name: Jodi Harris MRN: [id number] Date of Birth: 1940/03/27 Referring Provider: Rudene Christians  Encounter Date: 06/17/2015      OT End of Session - 06/17/15 1231    Visit Number 6   Number of Visits 6   Date for OT Re-Evaluation 06/17/15   OT Start Time 1036   OT Stop Time 1102   OT Time Calculation (min) 26 min   Activity Tolerance Patient tolerated treatment well   Behavior During Therapy [facility] for tasks assessed/performed      Past Medical History  Diagnosis Date  . Hypothyroidism   . Hypercholesterolemia   . GERD (gastroesophageal reflux disease)   . Anemia   . Thrombophlebitis     varicosities    Past Surgical History  Procedure Laterality Date  . Urethral diverticulum repair  1974    Dr Redmond Baseman  . Laparoscopic gastric banding    . Colon surgery      laparoscopic assisted transverse colectomy for tubular adenomatous polyp    There were no vitals filed for this visit.      Subjective Assessment - 06/17/15 1228    Subjective  The pain is better , but still triggering - maybe not as much but I am compensating too   Patient Stated Goals Want to get my thumb like it was before - need to get back to gardening - have no pain and more strength    Currently in Pain? Yes   Pain Score 1    Pain Location Finger (Comment which one)   Pain Orientation Left   Pain Descriptors / Indicators Aching            [facility] OT Assessment - 06/17/15 0001    Strength   Left Hand Grip (lbs) 30   Left Hand Lateral Pinch 12 lbs   Left Hand 3 Point Pinch 6 lbs   Left Hand AROM   L Thumb IP 0-80 65 Degrees   L Thumb Radial ADduction/ABduction 0-55 55   L Thumb Palmar ADduction/ABduction 0-45 55                  OT Treatments/Exercises (OP) - 06/17/15  0001    ADLs   ADL Comments PRWHE for pain done    Hand Exercises   Other Hand Exercises Measurements taken for grip and prehension and ROM of thumb - ed on trigger finger anatomy                OT Education - 06/17/15 1231    Education provided Yes   Education Details discharge instruction    Person(s) Educated Patient   Methods Explanation   Comprehension Verbalized understanding          OT Short Term Goals - 06/17/15 1234    OT SHORT TERM GOAL #1   Title Pain decrease on PRWHE by at least 10 points    Baseline PRWHE improve from 18 to 9/50   Status Partially Met   OT [place] #2   Title Pt report decrease triggering of L thumb and increase ROM at L thumb    Baseline less triggering but pt compensating    Status Partially Met           OT Long Term Goals - 06/17/15 1235  OT LONG TERM GOAL #1   Title Pt show increase functional use in PRWHE by 10 points    Baseline Function at eval on PRHWE was 16.5/50- stlll compensate    Status Partially Met   OT LONG TERM GOAL #2   Title Lat and 3 point grip improve with 3-5 lbs to report able to pull up pants , open medicine   Baseline Improve see flowsheet - but still hard to open medicine bottle   Status Partially Met               Plan - 06/17/15 1232    Clinical Impression Statement Pt 's pain in L thumb improve geatly from SOC - on PRWHE from 18 to 9/10 -  prehension strength improved greatly - but IP flexion decreased and triggering still present and tenderness over A1pulley of Thumb - discuss with pt progress but recommend to make app with Dr Menz for possible surgery    OT Treatment/Interventions Self-care/ADL training;Cryotherapy;Ultrasound;Iontophoresis;Contrast Bath;DME and/or AE instruction;Manual Therapy;Patient/family education;Therapeutic exercises   Plan Pt discharge and to make appt with Dr Menz   OT Home Exercise Plan see pt instructnion    Consulted and Agree with Plan of Care Patient       Patient will benefit from skilled therapeutic intervention in order to improve the following deficits and impairments:     Visit Diagnosis: Pain in left hand  Trigger finger of left hand    Problem List Patient Active Problem List   Diagnosis Date Noted  . Chest pain 03/25/2015  . URI (upper respiratory infection) 12/28/2014  . Acute bronchitis 04/27/2014  . Health care maintenance 04/06/2014  . Stress 10/03/2013  . Obesity 10/03/2013  . Left hip pain 09/22/2013  . Skin lesions 04/13/2013  . Hypothyroidism 02/04/2012  . History of colon polyps 02/04/2012  . GERD (gastroesophageal reflux disease) 02/04/2012  . Hypercholesterolemia 02/04/2012  . Vitamin D deficiency 02/04/2012    DuPreez, Maureen OTR/L,CLT  06/17/2015, 12:36 PM  [facility]TS MEDICINE [address] [place], , [address] Phone: [phone]   Fax:  [phone]  Name: Jodi Harris MRN: [id number] Date of Birth: 06/23/1940   

## 2015-06-18 ENCOUNTER — Encounter: Payer: Medicare Other | Admitting: Occupational Therapy

## 2015-07-15 ENCOUNTER — Ambulatory Visit: Payer: Medicare Other | Attending: Orthopedic Surgery | Admitting: Physical Therapy

## 2015-07-15 DIAGNOSIS — R262 Difficulty in walking, not elsewhere classified: Secondary | ICD-10-CM | POA: Diagnosis present

## 2015-07-15 DIAGNOSIS — M79604 Pain in right leg: Secondary | ICD-10-CM | POA: Insufficient documentation

## 2015-07-15 DIAGNOSIS — M6281 Muscle weakness (generalized): Secondary | ICD-10-CM | POA: Diagnosis present

## 2015-07-15 NOTE — Therapy (Signed)
[place] [facility] PH[facility]ICINE [address] [place], [place], [address] Phone: [phone]   Fax:  [phone]  Physical Therapy Evaluation  Patient Details  Name: Jodi Harris MRN: [id number] Date of Birth: 08/18/1940 No Data Recorded  Encounter Date: 07/15/2015      PT End of Session - 07/15/15 1123    Visit Number 1   Number of Visits 9   Date for PT Re-Evaluation 08/12/15   Authorization Type Medicare 1   Authorization Time Period of 10   PT Start Time 1013   PT Stop Time 1113   PT Time Calculation (min) 60 min   Activity Tolerance Patient tolerated treatment well   Behavior During Therapy [facility] for tasks assessed/performed      Past Medical History  Diagnosis Date  . Hypothyroidism   . Hypercholesterolemia   . GERD (gastroesophageal reflux disease)   . Anemia   . Thrombophlebitis     varicosities    Past Surgical History  Procedure Laterality Date  . Urethral diverticulum repair  1974    Dr [name]  . Laparoscopic gastric banding    . Colon surgery      laparoscopic assisted transverse colectomy for tubular adenomatous polyp    There were no vitals filed for this visit.       Subjective Assessment - 07/15/15 1604    Subjective Patient reports she had a stress test on a treadmill in April and began having L knee pain. She reports this has subsided since then, however she has had increasing levels of R hip (posterior) pain. She reports the pain is in the mid belly of the posterior hip musculature, radiating laterally to the femoral neck and greater trochanter. She reports her L knee feels very tight. She denies any numbness/tingling, pain is worst while stepping up.    Pertinent History Patient reports she has become progressively less active, has started gaining weight. She feels her legs have become weak.    Diagnostic tests X-ray of hip - degenerative changes noted.    Patient Stated Goals To return to  playing with her grandchildren, continue signing in the church choir.    Currently in Pain? Yes   Pain Score --  Does not rate, but reports some tightness/pain in R hip, particularly with gait.   Pain Location Hip   Pain Orientation Right   Pain Descriptors / Indicators Tightness   Pain Type Chronic pain   Pain Onset More than a month ago   Pain Frequency Intermittent   Aggravating Factors  Stepping up, sitting too long, standing prolonged time.             [facility] - 07/15/15 1622    Assessment   Medical Diagnosis --  Bilateral Knee OA   Referring Provider --  Dr. Martha ClanKrasinski    Prior Therapy --  Chiropractor   Precautions   Precautions None   Restrictions   Weight Bearing Restrictions No   Balance Screen   Has the patient fallen in the past 6 months No   Prior Function   Level of Independence Independent   Vocation Retired   Leisure --  Read, play with grandchildren, sing in choir    Cognition   Overall Cognitive Status Within Functional Limits for tasks assessed   Observation/Other Assessments   Lower Extremity Functional Scale  41   Sensation   Light Touch Appears Intact   Sit to Stand   Comments --  Unable to complete without UEs   ROM / Strength   AROM / PROM / Strength --  Strength 5/5 for MMTs    Slump test   Findings Negative   Straight Leg Raise   Findings Negative   Criag's Test    Findings Positive   Side --  Decreased IR Bilat, R moreso than L   Ely's Test   Findings Positive   Side --  Tightness in anterior compartment reported Bilaterally    Ambulation/Gait   Gait Comments --  Decrease R stand, IR Bilat, RLE in ER at foot, [facility]     Piriformis stretch x 10 bilaterally for 2 sets, patient did note some relief of symptoms during ambulation afterwards Hip IR mobilizations on RLE 5 bouts x 30" , well tolerated with no complaints noted Quad stretching in prone Ely's position as this was noted to be deficient in ROM, well  tolerated with a stretching sensation in anterior thigh noted.  Soft tissue mobilization on R piriformis mid-belly - one definite tender area noted, which appeared to dissipate with repetitive pressure Lower trunk rotations 2 sets x 10 repetitions PT guided, no complaints offered while completing.                       PT Education - 07/15/15 1120    Education provided Yes   Education Details Provided HEP for the weekend, belief that her symptoms appear to be consistent with gluteal tendinopathy.    Person(s) Educated Patient   Methods Explanation;Demonstration;Handout   Comprehension Returned demonstration;Verbalized understanding             PT Long Term Goals - 07/15/15 1632    PT LONG TERM GOAL #1   Title Patient will report an LEFS score of greater than 50/80 to demonstrate improved tolerance for ADLs.    Baseline 41/80   Time 4   Period Weeks   Status New   PT LONG TERM GOAL #2   Title Patient will report she is able to stand for at least 30 minutes with no increase in symptoms to complete recreational activities.    Baseline She can stand for roughly 10 minutes prior to symptoms.    Time 4   Period Weeks   Status New   PT LONG TERM GOAL #3   Title Patient will demonstrate gait speed of > 0.9 m/s to demonstrate improved ability to perform community mobility.    Time 4   Period Weeks   Status New               Plan - 07/15/15 1610    Clinical Impression Statement Patient is reporting L hip pain today, possibly from altered gait pattern and decreased LE strength. She has become progressively less active since acute joint pain flare up from stress test in April. Her reports are reminiscent of gluteal tendinopathy, she also demonstrates altered gait pattern due in part to decreased calf ROM on RLE. She would benefit from skilled PT services to address her LE complaints impairing her gait pattern.    Rehab Potential Good   Clinical Impairments  Affecting Rehab Potential Age, motivation, activity level.    PT Frequency 2x / week   PT Duration 4 weeks   PT Treatment/Interventions Aquatic Therapy;Therapeutic exercise;Therapeutic activities;Stair training;Gait training;Manual techniques;Taping;Dry needling;Passive range of motion   PT Next Visit Plan Mobilizations for L knee joint, quad stretching, hip abductor strengthening according to JOSPT protocol for gluteal tendinopathy  PT Home Exercise Plan Calf stretching, piriformis stretching, isometric hip abduction    Consulted and Agree with Plan of Care Patient      Patient will benefit from skilled therapeutic intervention in order to improve the following deficits and impairments:  Abnormal gait, Pain, Decreased balance, Decreased strength, Decreased mobility, Impaired flexibility, Decreased endurance, Decreased range of motion, Difficulty walking  Visit Diagnosis: Pain In Right Leg - Plan: PT plan of care cert/re-cert  Difficulty in walking, not elsewhere classified - Plan: PT plan of care cert/re-cert  Muscle weakness (generalized) - Plan: PT plan of care cert/re-cert      G-Codes - 07-21-2015 1613    Functional Assessment Tool Used LEFS, sit to stand, gait assessment    Functional Limitation Mobility: Walking and moving around   Mobility: Walking and Moving Around Current Status ([id number]) At least 20 percent but less than 40 percent impaired, limited or restricted   Mobility: Walking and Moving Around Goal Status [phone]) At least 1 percent but less than 20 percent impaired, limited or restricted       Problem List Patient Active Problem List   Diagnosis Date Noted  . Chest pain 03/25/2015  . URI (upper respiratory infection) 12/28/2014  . Acute bronchitis 04/27/2014  . Health care maintenance 04/06/2014  . Stress 10/03/2013  . Obesity 10/03/2013  . Left hip pain 09/22/2013  . Skin lesions 04/13/2013  . Hypothyroidism 02/04/2012  . History of colon polyps 02/04/2012   . GERD (gastroesophageal reflux disease) 02/04/2012  . Hypercholesterolemia 02/04/2012  . Vitamin D deficiency 02/04/2012   [name], PT, DPT    07-21-2015, 4:36 PM  [facility] PHYSICAL AND SPORTS MEDICINE [address], [place], [address] Phone: [phone]   Fax:  [phone]  Name: Jodi Harris MRN: [id number] Date of Birth: [birth year]

## 2015-07-16 ENCOUNTER — Other Ambulatory Visit: Payer: Self-pay

## 2015-07-19 ENCOUNTER — Ambulatory Visit: Payer: Medicare Other | Attending: Orthopedic Surgery | Admitting: Physical Therapy

## 2015-07-19 DIAGNOSIS — M6281 Muscle weakness (generalized): Secondary | ICD-10-CM | POA: Insufficient documentation

## 2015-07-19 DIAGNOSIS — M79604 Pain in right leg: Secondary | ICD-10-CM | POA: Insufficient documentation

## 2015-07-19 DIAGNOSIS — R262 Difficulty in walking, not elsewhere classified: Secondary | ICD-10-CM | POA: Diagnosis present

## 2015-07-20 NOTE — Therapy (Signed)
[place] [facility] [address] [place], [address] Phone: [phone]   Fax:  [phone]  Physical Therapy Treatment  Patient Details  Name: Jodi Harris MRN: [id number] Date of Birth: [birth year] No Data Recorded  Encounter Date: 07/19/2015      PT End of Session - 07/20/15 1253    Visit Number 2   Number of Visits 9   Date for PT Re-Evaluation 08/12/15   Authorization Type Medicare 2   Authorization Time Period of 10   PT Start Time 1031   PT Stop Time 1111   PT Time Calculation (min) 40 min   Activity Tolerance Patient tolerated treatment well   Behavior During Therapy [facility] for tasks assessed/performed      Past Medical History  Diagnosis Date  . Hypothyroidism   . Hypercholesterolemia   . GERD (gastroesophageal reflux disease)   . Anemia   . Thrombophlebitis     varicosities    Past Surgical History  Procedure Laterality Date  . Urethral diverticulum repair  1974    Dr Jenne Pane  . Laparoscopic gastric banding    . Colon surgery      laparoscopic assisted transverse colectomy for tubular adenomatous polyp    There were no vitals filed for this visit.      Subjective Assessment - 07/19/15 1035    Subjective Patient reports she is feeling much better, she has been feeling better since previous session as well. She reports some difficulty with HEP.    Pertinent History Patient reports she has become progressively less active, has started gaining weight. She feels her legs have become weak.    Diagnostic tests X-ray of hip - degenerative changes noted.    Patient Stated Goals To return to playing with her grandchildren, continue signing in the church choir.    Currently in Pain? --  Patient is aware of her hip, but it is not painful as it had been.    Pain Onset More than a month ago    Clamshells bilaterally, attempted to complete with band, however unable to perform through full ROM. 2  sets x 10 repetitions with assistance to complete through full range on LLE not on RLE. No additional pain reported.  Isometric clamshells in supine 2 sets x 10 repetitions.   Seated calf stretch x 10 for 3 rounds on RLE only, well tolerated.   102m walk 9.61 seconds. Gait no longer appears antalgic, reciprocal pattern noted with no loss of balance, minimal weight shifting. Minimal if any lateral weight shifting.                             PT Education - 07/20/15 1252    Education provided Yes   Education Details Add strengthening of postero-lateral hip musculature to HEP.    Person(s) Educated Patient   Methods Explanation;Demonstration;Handout   Comprehension Verbalized understanding;Returned demonstration             PT Long Term Goals - 07/15/15 1632    PT LONG TERM GOAL #1   Title Patient will report an LEFS score of greater than 50/80 to demonstrate improved tolerance for ADLs.    Baseline 41/80   Time 4   Period Weeks   Status New   PT LONG TERM GOAL #2   Title Patient will report she is able to stand for at least 30 minutes with no increase in symptoms  to complete recreational activities.    Baseline She can stand for roughly 10 minutes prior to symptoms.    Time 4   Period Weeks   Status New   PT LONG TERM GOAL #3   Title Patient will demonstrate gait speed of > 0.9 m/s to demonstrate improved ability to perform community mobility.    Time 4   Period Weeks   Status New               Plan - 07/20/15 1253    Clinical Impression Statement Patient reporting significant improvement in her posterior hip symptoms with stretching program. She demonstrates significant weakness in gluteues medius, struggling to complete sidelying clamshells against gravity. She was provided with HEP to address noted weakness, which will allow patient to hopefully ambulate with less pain.    Rehab Potential Good   Clinical Impairments Affecting Rehab  Potential Age, motivation, activity level.    PT Frequency 2x / week   PT Duration 4 weeks   PT Treatment/Interventions Aquatic Therapy;Therapeutic exercise;Therapeutic activities;Stair training;Gait training;Manual techniques;Taping;Dry needling;Passive range of motion   PT Next Visit Plan Mobilizations for L knee joint, quad stretching, hip abductor strengthening according to JOSPT protocol for gluteal tendinopathy    PT Home Exercise Plan Calf stretching, piriformis stretching, isometric hip abduction, clamshells.    Consulted and Agree with Plan of Care Patient      Patient will benefit from skilled therapeutic intervention in order to improve the following deficits and impairments:  Abnormal gait, Pain, Decreased balance, Decreased strength, Decreased mobility, Impaired flexibility, Decreased endurance, Decreased range of motion, Difficulty walking  Visit Diagnosis: Pain In Right Leg  Muscle weakness (generalized)  Difficulty in walking, not elsewhere classified     Problem List Patient Active Problem List   Diagnosis Date Noted  . Chest pain 03/25/2015  . URI (upper respiratory infection) 12/28/2014  . Acute bronchitis 04/27/2014  . Health care maintenance 04/06/2014  . Stress 10/03/2013  . Obesity 10/03/2013  . Left hip pain 09/22/2013  . Skin lesions 04/13/2013  . Hypothyroidism 02/04/2012  . History of colon polyps 02/04/2012  . GERD (gastroesophageal reflux disease) 02/04/2012  . Hypercholesterolemia 02/04/2012  . Vitamin D deficiency 02/04/2012   Kerin RansomPatrick A Buck Mcaffee, PT, DPT    07/20/2015, 12:56 PM  [facility] [address] [place], [place], [address] Phone: [phone]   Fax:  [phone]  Name: Sheliah PlaneSara [place] MRN: [id number] Date of Birth: 06/26/1940

## 2015-07-22 ENCOUNTER — Ambulatory Visit: Payer: Medicare Other | Admitting: Physical Therapy

## 2015-07-26 ENCOUNTER — Ambulatory Visit: Payer: Medicare Other | Admitting: Physical Therapy

## 2015-07-26 DIAGNOSIS — M79604 Pain in right leg: Secondary | ICD-10-CM

## 2015-07-26 DIAGNOSIS — R262 Difficulty in walking, not elsewhere classified: Secondary | ICD-10-CM

## 2015-07-26 DIAGNOSIS — M6281 Muscle weakness (generalized): Secondary | ICD-10-CM

## 2015-07-27 NOTE — Therapy (Signed)
[place] [facility] PHYS[facility]ORTS MEDICINE [address] [place], [address] Phone: [phone]   Fax:  [phone]  Physical Therapy Treatment  Patient Details  Name: Jodi Harris MRN: [id number] Date of Birth: 1940/05/15 No Data Recorded  Encounter Date: 07/26/2015      PT End of Session - 07/27/15 0921    Visit Number 3   Number of Visits 9   Date for PT Re-Evaluation 08/12/15   Authorization Type Medicare 3   Authorization Time Period of 10   PT Start Time 1032   PT Stop Time 1112   PT Time Calculation (min) 40 min   Activity Tolerance Patient tolerated treatment well   Behavior During Therapy [facility] for tasks assessed/performed      Past Medical History  Diagnosis Date  . Hypothyroidism   . Hypercholesterolemia   . GERD (gastroesophageal reflux disease)   . Anemia   . Thrombophlebitis     varicosities    Past Surgical History  Procedure Laterality Date  . Urethral diverticulum repair  1974    Dr Jenne Pane  . Laparoscopic gastric banding    . Colon surgery      laparoscopic assisted transverse colectomy for tubular adenomatous polyp    There were no vitals filed for this visit.      Subjective Assessment - 07/26/15 1036    Subjective Patient reports she is feeling much better over the past week. She had some pain initially after the session last week, but no residual complaints since that time.    Pertinent History Patient reports she has become progressively less active, has started gaining weight. She feels her legs have become weak.    Diagnostic tests X-ray of hip - degenerative changes noted.    Patient Stated Goals To return to playing with her grandchildren, continue signing in the church choir.    Currently in Pain? No/denies  She states she feels a little "something" in her R hip.      L sidelying hip abductions x 8, x 5 for 2 sets with yellow t-band   R sidelying abductions x 8, x 6, x 6 (able to  complete independently without pain).   Calf stretching 2 sets x10 bilaterally (cuing for set up, technique, foot placement) -- had to modify with her LEs abducted to reduce knee flexion and limit popping sensation.   Single leg stance 2 sets x 5" bilaterally with HHA x 1.   HS Curls with Red band (easy) attempted with red and green band however this was uncomfortable as bands began to twist. Attempted HS curl machine, unable to tolerate, discontinued attempts.                             PT Education - 07/27/15 [phone]    Education provided Yes   Education Details Progression of postero-lateral hip strengthening, monitor symptoms over the next few days, decrease HEP if pain is increased.    Person(s) Educated Patient   Methods Explanation;Demonstration;Handout   Comprehension Returned demonstration;Verbalized understanding             PT Long Term Goals - 07/15/15 1632    PT LONG TERM GOAL #1   Title Patient will report an LEFS score of greater than 50/80 to demonstrate improved tolerance for ADLs.    Baseline 41/80   Time 4   Period Weeks   Status New   PT LONG TERM  GOAL #2   Title Patient will report she is able to stand for at least 30 minutes with no increase in symptoms to complete recreational activities.    Baseline She can stand for roughly 10 minutes prior to symptoms.    Time 4   Period Weeks   Status New   PT LONG TERM GOAL #3   Title Patient will demonstrate gait speed of > 0.9 m/s to demonstrate improved ability to perform community mobility.    Time 4   Period Weeks   Status New               Plan - 07/27/15 [id number]    Clinical Impression Statement Patient continues to report improvement in her symptoms, she states she had pain initially after previous session and limited if any after that. She is progressing well with exercise program though difficulty with completing hamstring dominant exercises and squats due to knee popping/pain.  Further LE strewngthening in hip abductors and leg press will be beneficial in long term management for her.    Rehab Potential Good   Clinical Impairments Affecting Rehab Potential Age, motivation, activity level.    PT Frequency 2x / week   PT Duration 4 weeks   PT Treatment/Interventions Aquatic Therapy;Therapeutic exercise;Therapeutic activities;Stair training;Gait training;Manual techniques;Taping;Dry needling;Passive range of motion   PT Next Visit Plan Mobilizations for L knee joint, quad stretching, hip abductor strengthening according to JOSPT protocol for gluteal tendinopathy    PT Home Exercise Plan Calf stretching, piriformis stretching, isometric hip abduction, clamshells.    Consulted and Agree with Plan of Care Patient      Patient will benefit from skilled therapeutic intervention in order to improve the following deficits and impairments:  Abnormal gait, Pain, Decreased balance, Decreased strength, Decreased mobility, Impaired flexibility, Decreased endurance, Decreased range of motion, Difficulty walking  Visit Diagnosis: Pain In Right Leg  Muscle weakness (generalized)  Difficulty in walking, not elsewhere classified     Problem List Patient Active Problem List   Diagnosis Date Noted  . Chest pain 03/25/2015  . URI (upper respiratory infection) 12/28/2014  . Acute bronchitis 04/27/2014  . Health care maintenance 04/06/2014  . Stress 10/03/2013  . Obesity 10/03/2013  . Left hip pain 09/22/2013  . Skin lesions 04/13/2013  . Hypothyroidism 02/04/2012  . History of colon polyps 02/04/2012  . GERD (gastroesophageal reflux disease) 02/04/2012  . Hypercholesterolemia 02/04/2012  . Vitamin D deficiency 02/04/2012   Kerin RansomPatrick A Jasman Pfeifle, PT, DPT    07/27/2015, 9:32 AM  [place] [facility] [address] [place], [place], [address] Phone: [phone]   Fax:  [phone]  Name: Jodi Harris MRN:  [id number] Date of Birth: 03/01/1940

## 2015-07-29 ENCOUNTER — Ambulatory Visit: Payer: Medicare Other | Admitting: Physical Therapy

## 2015-07-29 DIAGNOSIS — M79604 Pain in right leg: Secondary | ICD-10-CM

## 2015-07-29 DIAGNOSIS — M6281 Muscle weakness (generalized): Secondary | ICD-10-CM

## 2015-07-29 DIAGNOSIS — R262 Difficulty in walking, not elsewhere classified: Secondary | ICD-10-CM

## 2015-07-29 NOTE — Therapy (Signed)
[facility] PHYSICAL AND SPORTS MEDICINE [address] [place], [place], [address] Phone: [phone]   Fax:  [phone]  Physical Therapy Treatment  Patient Details  Name: Jodi Harris MRN: [id number] Date of Birth: 11/21/1940 No Data Recorded  Encounter Date: 07/29/2015      PT End of Session - 07/29/15 1514    Visit Number 4   Number of Visits 9   Date for PT Re-Evaluation 08/12/15   Authorization Type Medicare 4   Authorization Time Period of 10   PT Start Time 1431   PT Stop Time 1509   PT Time Calculation (min) 38 min   Activity Tolerance Patient tolerated treatment well   [facility]      Past Medical History  Diagnosis Date  . Hypothyroidism   . Hypercholesterolemia   . GERD (gastroesophageal reflux disease)   . Anemia   . Thrombophlebitis     varicosities    Past Surgical History  Procedure Laterality Date  . Urethral diverticulum repair  1974    Dr [name]  . Laparoscopic gastric banding    . Colon surgery      laparoscopic assisted transverse colectomy for tubular adenomatous polyp    There were no vitals filed for this visit.      Subjective Assessment - 07/29/15 1431    Subjective Patient reports she had some anterior knee pain bilaterally yesterday likely from prolonged sitting she reports. She has had an easier time getting into bed, less hip pain as well.    Pertinent History Patient reports she has become progressively less active, has started gaining weight. She feels her legs have become weak.    Diagnostic tests X-ray of hip - degenerative changes noted.    Patient Stated Goals To return to playing with her grandchildren, continue signing in the church choir.    Currently in Pain? No/denies        3 sets of 10-12 repetitions of standing hip abductions with yellow t-band  (manual cuing to complete with pure abduction to avoid TFL substitution)   Calf  strecthing x 2 rounds  (reported relief of anterior knee pain she felt after standing abduction)   Supine bridging x 5, x 5 repetitions (required cuing to bring toes up to activate glutes)   HS Curls x 12 for 3 sets, first set with green tband with towel for skin protection, 2nd and 3rd sets with green/red t-band.                           PT Education - 07/29/15 1519    Education provided Yes   Education Details Progrssion to standing glute strengthening exercises.    Person(s) Educated Patient   Methods Explanation;Demonstration;Handout   Comprehension Returned demonstration;Verbalized understanding             PT Long Term Goals - 07/15/15 1632    PT LONG TERM GOAL #1   Title Patient will report an LEFS score of greater than 50/80 to demonstrate improved tolerance for ADLs.    Baseline 41/80   Time 4   Period Weeks   Status New   PT LONG TERM GOAL #2   Title Patient will report she is able to stand for at least 30 minutes with no increase in symptoms to complete recreational activities.    Baseline She can stand for roughly 10 minutes prior to symptoms.  Time 4   Period Weeks   Status New   PT LONG TERM GOAL #3   Title Patient will demonstrate gait speed of > 0.9 m/s to demonstrate improved ability to perform community mobility.    Time 4   Period Weeks   Status New               Plan - 07/29/15 1515    Clinical Impression Statement Patient reports she has improved hip symptoms, she is now having more arthritis/patellofemoral pain like symptoms. She is responding well to calf stretching, and improving with posterior hip strengthening. She is also demonstrating improved HS strength with band resisted pulls.    Rehab Potential Good   Clinical Impairments Affecting Rehab Potential Age, motivation, activity level.    PT Frequency 2x / week   PT Duration 4 weeks   PT Treatment/Interventions Aquatic Therapy;Therapeutic exercise;Therapeutic  activities;Stair training;Gait training;Manual techniques;Taping;Dry needling;Passive range of motion   PT Next Visit Plan Mobilizations for L knee joint, quad stretching, hip abductor strengthening according to JOSPT protocol for gluteal tendinopathy    PT Home Exercise Plan Calf stretching, piriformis stretching, isometric hip abduction, clamshells.    Consulted and Agree with Plan of Care Patient      Patient will benefit from skilled therapeutic intervention in order to improve the following deficits and impairments:  Abnormal gait, Pain, Decreased balance, Decreased strength, Decreased mobility, Impaired flexibility, Decreased endurance, Decreased range of motion, Difficulty walking  Visit Diagnosis: Pain In Right Leg  Muscle weakness (generalized)  Difficulty in walking, not elsewhere classified     Problem List Patient Active Problem List   Diagnosis Date Noted  . Chest pain 03/25/2015  . URI (upper respiratory infection) 12/28/2014  . Acute bronchitis 04/27/2014  . Health care maintenance 04/06/2014  . Stress 10/03/2013  . Obesity 10/03/2013  . Left hip pain 09/22/2013  . Skin lesions 04/13/2013  . Hypothyroidism 02/04/2012  . History of colon polyps 02/04/2012  . GERD (gastroesophageal reflux disease) 02/04/2012  . Hypercholesterolemia 02/04/2012  . Vitamin D deficiency 02/04/2012   [name], PT, DPT    07/29/2015, 3:33 PM  [facility] PHYSICAL AND SPORTS MEDICINE [address], [place], [address] Phone: [phone]   Fax:  [phone]  Name: Jodi Harris MRN: [id number] Date of Birth: May 09, 1940

## 2015-08-03 ENCOUNTER — Ambulatory Visit: Payer: Medicare Other | Admitting: Physical Therapy

## 2015-08-03 DIAGNOSIS — M6281 Muscle weakness (generalized): Secondary | ICD-10-CM

## 2015-08-03 DIAGNOSIS — R262 Difficulty in walking, not elsewhere classified: Secondary | ICD-10-CM

## 2015-08-03 DIAGNOSIS — M79604 Pain in right leg: Secondary | ICD-10-CM | POA: Diagnosis not present

## 2015-08-03 NOTE — Therapy (Signed)
[facility] [address], [place], [address] Phone: [phone]   Fax:  [phone]  Physical Therapy Treatment  Patient Details  Name: Jodi Harris Pacer MRN: [id number] Date of Birth: Sep 17, 1940 No Data Recorded  Encounter Date: 08/03/2015      PT End of Session - 08/03/15 1348    Visit Number 5   Number of Visits 9   Date for PT Re-Evaluation 08/12/15   Authorization Type Medicare 5   Authorization Time Period of 10   PT Start Time 0905   PT Stop Time 0945   PT Time Calculation (min) 40 min   Activity Tolerance Patient tolerated treatment well   Behavior During Therapy [facility] for tasks assessed/performed      Past Medical History:  Diagnosis Date  . Anemia   . GERD (gastroesophageal reflux disease)   . Hypercholesterolemia   . Hypothyroidism   . Thrombophlebitis    varicosities    Past Surgical History:  Procedure Laterality Date  . COLON SURGERY     laparoscopic assisted transverse colectomy for tubular adenomatous polyp  . LAPAROSCOPIC GASTRIC BANDING    . URETHRAL DIVERTICULUM REPAIR  1974   Dr Jenne Pane    There were no vitals filed for this visit.      Subjective Assessment - 08/03/15 0905    Subjective Patient reports her hip has felt much better recently, not causing as much discomfort at night. She is having what appears to be decreasing knee pain, though this remains persistent (awareness).    Pertinent History Patient reports she has become progressively less active, has started gaining weight. She feels her legs have become weak.    Diagnostic tests X-ray of hip - degenerative changes noted.    Patient Stated Goals To return to playing with her grandchildren, continue signing in the church choir.    Currently in Pain? Yes   Pain Score --  Reports she always has some pain in her knees, though this fluctuates, worst first thing in the morning and feels tight.    Pain Location Knee    Pain Orientation Right;Left   Pain Descriptors / Indicators Tightness   Pain Type Chronic pain   Pain Onset More than a month ago   Pain Frequency Intermittent   Aggravating Factors  Worst first thing in the morning    Pain Relieving Factors Stretching       R knee flexion - 111  L knee flexion to 119   Patellar mobilizations (tender on R side going medial)    Manual knee flexion stretching over table 5x bilaterally for 15-30" holds   Standing hip abductions with red t-band 2 sets x 10 (notable compensations in trunk) -- able to correct with cuing for more upright trunk posture   Side stepping with red t-band 3 sets x 8 repetitions bilaterally   Calf stretching on steps x 12 repetitions                            PT Education - 08/03/15 1348    Education provided Yes   Education Details Symptoms consistent with OA, treatment is to perform stretching and strengthening to provide more long term relief.    Person(s) Educated Patient   Methods Explanation;Demonstration;Handout   Comprehension Verbalized understanding;Returned demonstration             PT Long Term Goals - 07/15/15 [phone]  PT LONG TERM GOAL #1   Title Patient will report an LEFS score of greater than 50/80 to demonstrate improved tolerance for ADLs.    Baseline 41/80   Time 4   Period Weeks   Status New     PT LONG TERM GOAL #2   Title Patient will report she is able to stand for at least 30 minutes with no increase in symptoms to complete recreational activities.    Baseline She can stand for roughly 10 minutes prior to symptoms.    Time 4   Period Weeks   Status New     PT LONG TERM GOAL #3   Title Patient will demonstrate gait speed of > 0.9 m/s to demonstrate improved ability to perform community mobility.    Time 4   Period Weeks   Status New               Plan - 08/03/15 1349    Clinical Impression Statement Patient reporting dissipation of R hip pain,  she is having pain and stiffness in her anterior knees first thing in the morning, very consistent with OA like pain. She was provided with progressive strengthening and stretching program to address hip abductor weakness and quad/calf tightness often seen  with symptomatic knee OA.    Rehab Potential Good   Clinical Impairments Affecting Rehab Potential Age, motivation, activity level.    PT Frequency 2x / week   PT Duration 4 weeks   PT Treatment/Interventions Aquatic Therapy;Therapeutic exercise;Therapeutic activities;Stair training;Gait training;Manual techniques;Taping;Dry needling;Passive range of motion   PT Next Visit Plan Mobilizations for L knee joint, quad stretching, hip abductor strengthening according to JOSPT protocol for gluteal tendinopathy    PT Home Exercise Plan Calf stretching, piriformis stretching, isometric hip abduction, clamshells.    Consulted and Agree with Plan of Care Patient      Patient will benefit from skilled therapeutic intervention in order to improve the following deficits and impairments:  Abnormal gait, Pain, Decreased balance, Decreased strength, Decreased mobility, Impaired flexibility, Decreased endurance, Decreased range of motion, Difficulty walking  Visit Diagnosis: Pain In Right Leg  Muscle weakness (generalized)  Difficulty in walking, not elsewhere classified     Problem List Patient Active Problem List   Diagnosis Date Noted  . Chest pain 03/25/2015  . URI (upper respiratory infection) 12/28/2014  . Acute bronchitis 04/27/2014  . Health care maintenance 04/06/2014  . Stress 10/03/2013  . Obesity 10/03/2013  . Left hip pain 09/22/2013  . Skin lesions 04/13/2013  . Hypothyroidism 02/04/2012  . History of colon polyps 02/04/2012  . GERD (gastroesophageal reflux disease) 02/04/2012  . Hypercholesterolemia 02/04/2012  . Vitamin D deficiency 02/04/2012   Jodi Harris    08/03/2015, 3:00 PM  [place] [facility]  [facility] PHYSICAL AND SPORTS MEDICINE [address], [place], [address] Phone: [phone]   Fax:  [phone]  Name: Jodi Harris MRN: [id number] Date of Birth: 08/21/1940

## 2015-08-03 NOTE — Patient Instructions (Addendum)
R knee flexion - 111  L knee flexion to 119   Patellar mobilizations (tender on R side going medial)    Manual knee flexion stretching over table 5x bilaterally for 15-30" holds   Standing hip abductions with red t-band 2 sets x 10 (notable compensations in trunk)  Side stepping with red t-band 3 sets x 8 repetitions bilaterally   Calf stretching on steps x 12 repetitions

## 2015-08-05 ENCOUNTER — Ambulatory Visit: Payer: Medicare Other | Admitting: Physical Therapy

## 2015-08-09 ENCOUNTER — Ambulatory Visit: Payer: Medicare Other | Admitting: Physical Therapy

## 2015-08-09 DIAGNOSIS — M79604 Pain in right leg: Secondary | ICD-10-CM | POA: Diagnosis not present

## 2015-08-09 DIAGNOSIS — R262 Difficulty in walking, not elsewhere classified: Secondary | ICD-10-CM

## 2015-08-09 DIAGNOSIS — M6281 Muscle weakness (generalized): Secondary | ICD-10-CM

## 2015-08-09 NOTE — Therapy (Signed)
[place] PHYSICAL AND SPORTS MEDICINE [address] [place], [address] Phone: [phone]   Fax:  [phone]  Physical Therapy Treatment  Patient Details  Name: Jodi Harris MRN: [id number] Date of Birth: [birth year] No Data Recorded  Encounter Date: 08/09/2015      PT End of Session - 08/09/15 1047    Visit Number 6   Number of Visits 9   Date for PT Re-Evaluation 08/12/15   Authorization Type Medicare 6   Authorization Time Period of 10   PT Start Time 1036   PT Stop Time 1110   PT Time Calculation (min) 34 min   Activity Tolerance Patient tolerated treatment well   Behavior During Therapy [facility] for tasks assessed/performed      Past Medical History:  Diagnosis Date  . Anemia   . GERD (gastroesophageal reflux disease)   . Hypercholesterolemia   . Hypothyroidism   . Thrombophlebitis    varicosities    Past Surgical History:  Procedure Laterality Date  . COLON SURGERY     laparoscopic assisted transverse colectomy for tubular adenomatous polyp  . LAPAROSCOPIC GASTRIC BANDING    . URETHRAL DIVERTICULUM REPAIR  1974   Dr Jodi Harris    There were no vitals filed for this visit.      Subjective Assessment - 08/09/15 1038    Subjective Patient reports she had a good trip, she reports "everything is ok" aside from feeling congested. Patient reports her knees still bother her, but she this is expected.    Pertinent History Patient reports she has become progressively less active, has started gaining weight. She feels her legs have become weak.    Diagnostic tests X-ray of hip - degenerative changes noted.    Patient Stated Goals To return to playing with her grandchildren, continue signing in the church choir.    Currently in Pain? No/denies      LEFS - 47/80  63mwalk time - 8.8 seconds   Patient educated regarding going to continuing fitness [place]  Patient reported having lost red t-band and was provided with additional theraband. Reported no added questions, though she would like to return to clinic in roughly one month from now, continue HEP as written as she is satisfied with her progress.                            PT Education - 08/09/15 1052    Education provided Yes   Education Details Continue with HEP, list of hand specialists for the ganglion cyst she has on her R middle finger.    Person(s) Educated Patient   Methods Explanation   Comprehension Verbalized understanding             PT Long Term Goals - 08/09/15 1050      PT LONG TERM GOAL #1   Title Patient will report an LEFS score of greater than 50/80 to demonstrate improved tolerance for ADLs.    Baseline 41/80 at baseline, 7/31 - 47/80   Time 4   Period Weeks   Status Partially Met     PT LONG TERM GOAL #2   Title Patient will report she is able to stand for at least 30 minutes with no increase in symptoms to complete recreational activities.    Baseline She can stand for roughly 10 minutes prior to symptoms. 7/31 - She reports  she can be on her feet for much longer but needs to move around but can tolerate 30+ minutes now.    Time 4   Period Weeks   Status Achieved     PT LONG TERM GOAL #3   Title Patient will demonstrate gait speed of > 0.9 m/s to demonstrate improved ability to perform community mobility.    Baseline 8.8 seconds for 71mwalk.    Time 4   Period Weeks   Status Achieved               Plan - 0Aug 09, 20171051    Clinical Impression Statement Patient reports she has had reduction of all symptoms she came in for. She reports she is able to complete the activites she would like to do and would like to come in for one additional follow up in several weeks. She demonstrates appropriate gait speed, no pain with gait currently. She would benefit from one additional session for follow up for any questions regarding HEP.     Rehab Potential Good   Clinical Impairments Affecting Rehab Potential Age, motivation, activity level.    PT Frequency 2x / week   PT Duration 4 weeks   PT Treatment/Interventions Aquatic Therapy;Therapeutic exercise;Therapeutic activities;Stair training;Gait training;Manual techniques;Taping;Dry needling;Passive range of motion   PT Next Visit Plan Mobilizations for L knee joint, quad stretching, hip abductor strengthening according to JOSPT protocol for gluteal tendinopathy    PT Home Exercise Plan Calf stretching, piriformis stretching, isometric hip abduction, clamshells.    Consulted and Agree with Plan of Care Patient      Patient will benefit from skilled therapeutic intervention in order to improve the following deficits and impairments:  Abnormal gait, Pain, Decreased balance, Decreased strength, Decreased mobility, Impaired flexibility, Decreased endurance, Decreased range of motion, Difficulty walking  Visit Diagnosis: Pain In Right Leg  Muscle weakness (generalized)  Difficulty in walking, not elsewhere classified       G-Codes - 009-Aug-20171101    Functional Assessment Tool Used LEFS, 142malk, gait assessment    Functional Limitation Mobility: Walking and moving around   Mobility: Walking and Moving Around Current Status ([id number] least 1 percent but less than 20 percent impaired, limited or restricted   Mobility: Walking and Moving Around Goal Status ([id number] least 1 percent but less than 20 percent impaired, limited or restricted      Problem List Patient Active Problem List   Diagnosis Date Noted  . Chest pain 03/25/2015  . URI (upper respiratory infection) 12/28/2014  . Acute bronchitis 04/27/2014  . Health care maintenance 04/06/2014  . Stress 10/03/2013  . Obesity 10/03/2013  . Left hip pain 09/22/2013  . Skin lesions 04/13/2013  . Hypothyroidism 02/04/2012  . History of colon polyps 02/04/2012  . GERD (gastroesophageal reflux disease) 02/04/2012   . Hypercholesterolemia 02/04/2012  . Vitamin D deficiency 02/04/2012   PaKerman PasseyPT, DPT    7/Aug 09, 20174:50 PM  [facility] [address]: [phone] Fax:  [phone]: Jodi LeechRN: [id number] of Birth: 1107/14/1942

## 2015-08-09 NOTE — Patient Instructions (Signed)
LEFS - 47/80  63m walk time - 8.8 seconds

## 2015-08-09 NOTE — Addendum Note (Signed)
Addended byAlva Garnet A on: 08/09/2015 05:50 PM   Modules accepted: Orders

## 2015-08-12 ENCOUNTER — Encounter: Payer: Medicare Other | Admitting: Physical Therapy

## 2015-09-14 ENCOUNTER — Telehealth: Payer: Self-pay | Admitting: *Deleted

## 2015-09-14 NOTE — Telephone Encounter (Signed)
[employer] has requested pt's weight from April and a current weight  Please call [phone]

## 2015-09-14 NOTE — Telephone Encounter (Signed)
Labs are already ordered.  Can schedule lab appt.

## 2015-09-14 NOTE — Telephone Encounter (Signed)
Labs can be scheduled, thanks

## 2015-09-14 NOTE — Telephone Encounter (Signed)
Called [employer] back , this was not for this patient. ERROR

## 2015-09-14 NOTE — Telephone Encounter (Signed)
Last labs were done in March of this year. thanks

## 2015-09-14 NOTE — Telephone Encounter (Signed)
scheduled

## 2015-09-14 NOTE — Telephone Encounter (Signed)
Patient requested to have a complete lab panel ordered.  She rather have the labs verse coming in for a office visit Pt contact [phone]

## 2015-09-16 ENCOUNTER — Ambulatory Visit: Payer: Medicare Other | Admitting: Physical Therapy

## 2015-09-20 ENCOUNTER — Other Ambulatory Visit (INDEPENDENT_AMBULATORY_CARE_PROVIDER_SITE_OTHER): Payer: Medicare Other

## 2015-09-20 ENCOUNTER — Encounter (INDEPENDENT_AMBULATORY_CARE_PROVIDER_SITE_OTHER): Payer: Self-pay

## 2015-09-20 DIAGNOSIS — E7800 Pure hypercholesterolemia, unspecified: Secondary | ICD-10-CM | POA: Diagnosis not present

## 2015-09-20 DIAGNOSIS — R079 Chest pain, unspecified: Secondary | ICD-10-CM | POA: Diagnosis not present

## 2015-09-20 DIAGNOSIS — E559 Vitamin D deficiency, unspecified: Secondary | ICD-10-CM | POA: Diagnosis not present

## 2015-09-20 DIAGNOSIS — K219 Gastro-esophageal reflux disease without esophagitis: Secondary | ICD-10-CM

## 2015-09-20 LAB — HEPATIC FUNCTION PANEL
ALT: 9 U/L (ref 0–35)
AST: 16 U/L (ref 0–37)
Albumin: 3.6 g/dL (ref 3.5–5.2)
Alkaline Phosphatase: 60 U/L (ref 39–117)
BILIRUBIN DIRECT: 0.1 mg/dL (ref 0.0–0.3)
BILIRUBIN TOTAL: 0.5 mg/dL (ref 0.2–1.2)
Total Protein: 6.5 g/dL (ref 6.0–8.3)

## 2015-09-20 LAB — LIPID PANEL
CHOLESTEROL: 205 mg/dL — AB (ref 0–200)
HDL: 53.5 mg/dL (ref >39.00)
LDL Cholesterol: 139 mg/dL — ABNORMAL HIGH (ref 0–99)
NONHDL: 151.76
Total CHOL/HDL Ratio: 4
Triglycerides: 64 mg/dL (ref 0.0–149.0)
VLDL: 12.8 mg/dL (ref 0.0–40.0)

## 2015-09-20 LAB — CBC WITH DIFFERENTIAL/PLATELET
BASOS ABS: 0 10*3/uL (ref 0.0–0.1)
BASOS PCT: 0.5 % (ref 0.0–3.0)
EOS ABS: 0.1 10*3/uL (ref 0.0–0.7)
Eosinophils Relative: 1.8 % (ref 0.0–5.0)
HEMATOCRIT: 40.3 % (ref 36.0–46.0)
HEMOGLOBIN: 13.2 g/dL (ref 12.0–15.0)
LYMPHS PCT: 29 % (ref 12.0–46.0)
Lymphs Abs: 1.3 10*3/uL (ref 0.7–4.0)
MCHC: 32.8 g/dL (ref 30.0–36.0)
MCV: 88.6 fl (ref 78.0–100.0)
MONO ABS: 0.3 10*3/uL (ref 0.1–1.0)
Monocytes Relative: 7.2 % (ref 3.0–12.0)
Neutro Abs: 2.9 10*3/uL (ref 1.4–7.7)
Neutrophils Relative %: 61.5 % (ref 43.0–77.0)
Platelets: 222 10*3/uL (ref 150.0–400.0)
RBC: 4.55 Mil/uL (ref 3.87–5.11)
RDW: 14.7 % (ref 11.5–15.5)
WBC: 4.6 10*3/uL (ref 4.0–10.5)

## 2015-09-20 LAB — BASIC METABOLIC PANEL
BUN: 15 mg/dL (ref 6–23)
CHLORIDE: 103 meq/L (ref 96–112)
CO2: 33 meq/L — AB (ref 19–32)
CREATININE: 0.73 mg/dL (ref 0.40–1.20)
Calcium: 8.7 mg/dL (ref 8.4–10.5)
GFR: 82.64 mL/min (ref >60.00)
GLUCOSE: 94 mg/dL (ref 70–99)
Potassium: 4.8 mEq/L (ref 3.5–5.1)
Sodium: 140 mEq/L (ref 135–145)

## 2015-09-20 LAB — VITAMIN D 25 HYDROXY (VIT D DEFICIENCY, FRACTURES): VITD: 48.87 ng/mL (ref 30.00–100.00)

## 2015-09-20 LAB — TSH: TSH: 4.39 u[IU]/mL (ref 0.35–4.50)

## 2015-09-21 ENCOUNTER — Telehealth: Payer: Self-pay | Admitting: *Deleted

## 2015-09-21 NOTE — Telephone Encounter (Signed)
Left a VM to return my call,  

## 2015-09-21 NOTE — Telephone Encounter (Signed)
Please call pt at [phone]

## 2015-09-21 NOTE — Telephone Encounter (Signed)
Spoke with patient, see result note, thanks 

## 2015-09-21 NOTE — Telephone Encounter (Signed)
Pt has requested her lab results  Pt contact [phone]

## 2015-09-23 NOTE — Progress Notes (Signed)
Pt scheduled for 11/03/15 @12pm 

## 2015-09-23 NOTE — Progress Notes (Signed)
Pt scheduled for 11/03/15 @12pm °

## 2015-10-12 ENCOUNTER — Ambulatory Visit: Payer: Medicare Other

## 2015-10-12 ENCOUNTER — Other Ambulatory Visit: Payer: Self-pay | Admitting: Orthopedic Surgery

## 2015-10-12 DIAGNOSIS — R2242 Localized swelling, mass and lump, left lower limb: Secondary | ICD-10-CM

## 2015-10-12 DIAGNOSIS — R609 Edema, unspecified: Secondary | ICD-10-CM

## 2015-10-13 ENCOUNTER — Other Ambulatory Visit: Payer: Self-pay | Admitting: Internal Medicine

## 2015-10-13 ENCOUNTER — Ambulatory Visit
Admission: RE | Admit: 2015-10-13 | Discharge: 2015-10-13 | Disposition: A | Discharge status: Home / Self Care | Payer: Medicare Other | Source: Ambulatory Visit | Attending: Orthopedic Surgery | Admitting: Orthopedic Surgery

## 2015-10-13 DIAGNOSIS — R2242 Localized swelling, mass and lump, left lower limb: Secondary | ICD-10-CM

## 2015-10-13 DIAGNOSIS — R609 Edema, unspecified: Secondary | ICD-10-CM | POA: Insufficient documentation

## 2015-11-03 ENCOUNTER — Ambulatory Visit (INDEPENDENT_AMBULATORY_CARE_PROVIDER_SITE_OTHER): Payer: Medicare Other | Admitting: Internal Medicine

## 2015-11-03 ENCOUNTER — Encounter: Payer: Self-pay | Admitting: Internal Medicine

## 2015-11-03 VITALS — BP 116/60 | HR 80 | Temp 97.9°F | Ht 66.0 in | Wt 266.2 lb

## 2015-11-03 DIAGNOSIS — M25562 Pain in left knee: Secondary | ICD-10-CM

## 2015-11-03 DIAGNOSIS — IMO0001 Reserved for inherently not codable concepts without codable children: Secondary | ICD-10-CM

## 2015-11-03 DIAGNOSIS — E7800 Pure hypercholesterolemia, unspecified: Secondary | ICD-10-CM | POA: Diagnosis not present

## 2015-11-03 DIAGNOSIS — K219 Gastro-esophageal reflux disease without esophagitis: Secondary | ICD-10-CM

## 2015-11-03 DIAGNOSIS — Z6841 Body Mass Index (BMI) 40.0 and over, adult: Secondary | ICD-10-CM

## 2015-11-03 DIAGNOSIS — R079 Chest pain, unspecified: Secondary | ICD-10-CM

## 2015-11-03 DIAGNOSIS — E669 Obesity, unspecified: Secondary | ICD-10-CM | POA: Diagnosis not present

## 2015-11-03 DIAGNOSIS — E039 Hypothyroidism, unspecified: Secondary | ICD-10-CM | POA: Diagnosis not present

## 2015-11-03 NOTE — Progress Notes (Signed)
Patient ID: Jodi Harris, female   DOB: 1940/04/08, 75 y.o.   MRN: [id number]   Subjective:    Patient ID: Jodi Harris, female    DOB: [birth year], 75 y.o.   MRN: [id number]  HPI  Patient here for a scheduled follow up. Was evaluated in 04/2015 for sob, chest pain and dizziness.  See cardiology note.  Had normal stress echo.  Had holter - PACs/PVCs.  No acute arrhythmia.  Felt no further cardiac w/up warranted.  She reports she feels better.  Breathing stable.  No nausea or vomiting.  No abdominal pain or cramping.  Bowels stable.  Does report some left knee discomfort and some swelling.  Saw ortho.  Had venous ultrasound.  Baker's cyst.  Felt no further w/up warranted.  She also reports a burning sensation - left thigh.  Only notices this when she sleeps on her stomach.  Overall she feel things are stable.     Past Medical History:  Diagnosis Date  . Anemia   . GERD (gastroesophageal reflux disease)   . Hypercholesterolemia   . Hypothyroidism   . Thrombophlebitis    varicosities   Past Surgical History:  Procedure Laterality Date  . COLON SURGERY     laparoscopic assisted transverse colectomy for tubular adenomatous polyp  . LAPAROSCOPIC GASTRIC BANDING    . URETHRAL DIVERTICULUM REPAIR  1974   Dr Jenne Pane   Family History  Problem Relation Age of Onset  . Cancer Father     lymphoma  . Cancer Maternal Aunt     Breast  . Breast cancer Maternal Aunt 50  . Colon cancer      one relative on maternal side   Social History   Social History  . Marital status: Married    Spouse name: N/A  . Number of children: 2  . Years of education: N/A   Social History Main Topics  . Smoking status: Former Games developer  . Smokeless tobacco: Never Used     Comment: smoked as a teenager  . Alcohol use 0.0 oz/week     Comment: has a glass of wine about once a month  . Drug use: No  . Sexual activity: No   Other Topics Concern  . None   Social History Narrative  . None     Outpatient Encounter Prescriptions as of 11/03/2015  Medication Sig  . calcium carbonate (TUMS EX) 750 MG chewable tablet Chew 1 tablet by mouth as needed.   . Cholecalciferol (VITAMIN D-3 PO) Take 2,000 Units by mouth daily.  Marland Kitchen esomeprazole (NEXIUM) 20 MG capsule Take 20 mg by mouth daily at 12 noon.  . fluticasone (FLONASE) 50 MCG/ACT nasal spray Place 2 sprays into both nostrils daily as needed.  . fluticasone (FLOVENT HFA) 110 MCG/ACT inhaler Inhale 2 puffs into the lungs 2 (two) times daily.  . Multiple Vitamins-Minerals (MULTIVITAMIN PO) Take by mouth.  . Naproxen Sodium (ALEVE PO) Take by mouth as needed.   . Probiotic Product (ALIGN PO) Take 1 capsule by mouth daily.  Marland Kitchen SYNTHROID 125 MCG tablet Take 1 tablet (125 mcg total) by mouth daily.   No facility-administered encounter medications on file as of 11/03/2015.     Review of Systems  Constitutional: Negative for appetite change and unexpected weight change.  HENT: Negative for congestion and sinus pressure.   Respiratory: Negative for cough, chest tightness and shortness of breath.   Cardiovascular: Negative for chest pain, palpitations and leg swelling.  Gastrointestinal: Negative for abdominal pain,  diarrhea, nausea and vomiting.  Musculoskeletal: Negative for myalgias.       Left knee pain as outlined.    Skin: Negative for color change and rash.  Neurological: Negative for dizziness, light-headedness and headaches.  Psychiatric/Behavioral: Negative for agitation and dysphoric mood.       Objective:     Blood pressure rechecked by me:  120/72  Physical Exam  Constitutional: She appears well-developed and well-nourished. No distress.  HENT:  Nose: Nose normal.  Mouth/Throat: Oropharynx is clear and moist.  Neck: Neck supple. No thyromegaly present.  Cardiovascular: Normal rate and regular rhythm.   Pulmonary/Chest: Breath sounds normal. No respiratory distress. She has no wheezes.  Abdominal: Soft. Bowel  sounds are normal. There is no tenderness.  Musculoskeletal: She exhibits no tenderness.  No increased edema.   Lymphadenopathy:    She has no cervical adenopathy.  Skin: No rash noted. No erythema.  Psychiatric: She has a normal mood and affect. Her behavior is normal.    BP 116/60   Pulse 80   Temp 97.9 F (36.6 C) (Oral)   Ht 5\' 6"  (1.676 m)   Wt 266 lb 3.2 oz (120.7 kg)   LMP 02/01/1993   SpO2 97%   BMI 42.97 kg/m  Wt Readings from Last 3 Encounters:  11/03/15 266 lb 3.2 oz (120.7 kg)  03/24/15 264 lb 6 oz (119.9 kg)  02/25/15 263 lb 4.8 oz (119.4 kg)     Lab Results  Component Value Date   WBC 4.6 09/20/2015   HGB 13.2 09/20/2015   HCT 40.3 09/20/2015   PLT 222.0 09/20/2015   GLUCOSE 94 09/20/2015   CHOL 205 (H) 09/20/2015   TRIG 64.0 09/20/2015   HDL 53.50 09/20/2015   LDLDIRECT 129.8 02/15/2012   LDLCALC 139 (H) 09/20/2015   ALT 9 09/20/2015   AST 16 09/20/2015   NA 140 09/20/2015   K 4.8 09/20/2015   CL 103 09/20/2015   CREATININE 0.73 09/20/2015   BUN 15 09/20/2015   CO2 33 (H) 09/20/2015   TSH 4.39 09/20/2015    US Venous Img Lower Unilateral Left  Result Date: 10/13/2015 CLINICAL DATA:  75 year old female with a history of swelling, pain, edema, burning sensation for 1 month EXAM: LEFT LOWER EXTREMITY VENOUS DOPPLER ULTRASOUND TECHNIQUE: Gray-scale sonography with graded compression, as well as color Doppler and duplex ultrasound were performed to evaluate the lower extremity deep venous systems from the level of the common femoral vein and including the common femoral, femoral, profunda femoral, popliteal and calf veins including the posterior tibial, peroneal and gastrocnemius veins when visible. The superficial great saphenous vein was also interrogated. Spectral Doppler was utilized to evaluate flow at rest and with distal augmentation maneuvers in the common femoral, femoral and popliteal veins. COMPARISON:  None. FINDINGS: Contralateral Common  Femoral Vein: Respiratory phasicity is normal and symmetric with the symptomatic side. No evidence of thrombus. Normal compressibility. Common Femoral Vein: No evidence of thrombus. Normal compressibility, respiratory phasicity and response to augmentation. Saphenofemoral Junction: No evidence of thrombus. Normal compressibility and flow on color Doppler imaging. Profunda Femoral Vein: No evidence of thrombus. Normal compressibility and flow on color Doppler imaging. Femoral Vein: No evidence of thrombus. Normal compressibility, respiratory phasicity and response to augmentation. Popliteal Vein: No evidence of thrombus. Normal compressibility, respiratory phasicity and response to augmentation. Calf Veins: No evidence of thrombus. Normal compressibility and flow on color Doppler imaging. Superficial Great Saphenous Vein: No evidence of thrombus. Normal compressibility and flow on color  Doppler imaging. Other Findings: Anechoic cystic structure within the popliteal fossa measuring 4.4 cm x 1.6 cm x 2.3 cm with internal debris. IMPRESSION: Sonographic survey left lower extremity negative for DVT. Most likely Baker cyst of the left popliteal fossa measuring as great as 4.4 cm. Signed, Yvone NeuJaime S. Loreta AveWagner, DO [facility] Electronically Signed   By: Gilmer MorJaime  Wagner D.O.   On: 10/13/2015 10:00       Assessment & Plan:   Problem List Items Addressed This Visit    Chest pain    Worked up by cardiology.  Negative stress test.  Felt no further cardiac w/upu warranted.  Follow.        GERD (gastroesophageal reflux disease)    Controlled on nexium.  Follow.       Hypercholesterolemia    Low cholesterol diet and exercise.  Follow lipid panel.  LDL increased some on last check - LDL 139.  Follow.       Relevant Orders   Basic metabolic panel   Lipid panel   Hepatic function panel   Hypothyroidism    On thyroid replacement.  Follow tsh.       Relevant  Orders   TSH   Obesity - Primary    Diet and exercise.  Follow.        Other Visit Diagnoses    Acute pain of left knee       Evaluated by ortho.  Ultrasound - negative DVT.  felt no further w/up warranted.  follow.        Dale DurhamSCOTT, Cenia Zaragosa, MD

## 2015-11-03 NOTE — Progress Notes (Signed)
Pre visit review using our clinic review tool, if applicable. No additional management support is needed unless otherwise documented below in the visit note. 

## 2015-11-07 ENCOUNTER — Encounter: Payer: Self-pay | Admitting: Internal Medicine

## 2015-11-07 NOTE — Assessment & Plan Note (Signed)
On thyroid replacement.  Follow tsh.  

## 2015-11-07 NOTE — Assessment & Plan Note (Signed)
Low cholesterol diet and exercise.  Follow lipid panel.  LDL increased some on last check - LDL 139.  Follow.

## 2015-11-07 NOTE — Assessment & Plan Note (Signed)
Worked up by cardiology.  Negative stress test.  Felt no further cardiac w/upu warranted.  Follow.

## 2015-11-07 NOTE — Assessment & Plan Note (Signed)
Diet and exercise.  Follow.  

## 2015-11-07 NOTE — Assessment & Plan Note (Signed)
Controlled on nexium.  Follow.  

## 2015-12-13 ENCOUNTER — Telehealth: Payer: Self-pay | Admitting: Internal Medicine

## 2015-12-13 NOTE — Telephone Encounter (Signed)
Agree with need to be seen.  I can see her tomorrow at 1:30.  Thanks

## 2015-12-13 NOTE — Telephone Encounter (Signed)
Patient states she has nasal congestion, tooth pain and dizziness.she states its her usual sinus infection symptoms. Informed patient she would probably need to be evaluated. Patient would prefer not to come in. Patient has tried Flonase and saline spray.

## 2015-12-13 NOTE — Telephone Encounter (Signed)
PT called and stated that she has a sinus infection. Wanted to know if Dr. Lorin PicketScott would send in an rx for a z pak. Please advise, thank you!  Call pt @ [phone]  Pharmacy - [facility] - [place], [place] - [address]

## 2015-12-13 NOTE — Telephone Encounter (Signed)
Patient is scheduled   

## 2015-12-14 ENCOUNTER — Ambulatory Visit (INDEPENDENT_AMBULATORY_CARE_PROVIDER_SITE_OTHER): Payer: Medicare Other | Admitting: Internal Medicine

## 2015-12-14 ENCOUNTER — Ambulatory Visit: Payer: Medicare Other | Admitting: Internal Medicine

## 2015-12-14 ENCOUNTER — Encounter: Payer: Self-pay | Admitting: Internal Medicine

## 2015-12-14 DIAGNOSIS — J329 Chronic sinusitis, unspecified: Secondary | ICD-10-CM | POA: Diagnosis not present

## 2015-12-14 MED ORDER — AZITHROMYCIN 250 MG PO TABS: ORAL_TABLET | ORAL | 0 refills | Status: DC

## 2015-12-14 NOTE — Progress Notes (Signed)
Patient ID: Jodi MaisSara Compton Harris, female   DOB: 06/01/1940, 75 y.o.   MRN: [id number]   Subjective:    Patient ID: Jodi Harris, female    DOB: 10/04/1940, 75 y.o.   MRN: [id number]  HPI  Patient here as a work in with concerns regarding increased congestion and dizziness.  States symptoms have been present for one week.  Increased congestion and cough.  Increased sinus pressure.  Some dizziness.  No earache.  States feels similar to her previous sinus infections.  No sob.  No chest pain.  Eating and drinking.  Has been using saline and flonase.     Past Medical History:  Diagnosis Date  . Anemia   . GERD (gastroesophageal reflux disease)   . Hypercholesterolemia   . Hypothyroidism   . Thrombophlebitis    varicosities   Past Surgical History:  Procedure Laterality Date  . COLON SURGERY     laparoscopic assisted transverse colectomy for tubular adenomatous polyp  . LAPAROSCOPIC GASTRIC BANDING    . URETHRAL DIVERTICULUM REPAIR  1974   Dr [name]   Family History  Problem Relation Age of Onset  . Cancer Father     lymphoma  . Cancer Maternal Aunt     Breast  . Breast cancer Maternal Aunt 50  . Colon cancer      one relative on maternal side   Social History   Social History  . Marital status: Married    Spouse name: N/A  . Number of children: 2  . Years of education: N/A   Social History Main Topics  . Smoking status: Former Games developermoker  . Smokeless tobacco: Never Used     Comment: smoked as a teenager  . Alcohol use 0.0 oz/week     Comment: has a glass of wine about once a month  . Drug use: No  . Sexual activity: No   Other Topics Concern  . None   Social History Narrative  . None    Outpatient Encounter Prescriptions as of 12/14/2015  Medication Sig  . calcium carbonate (TUMS EX) 750 MG chewable tablet Chew 1 tablet by mouth as needed.   . Cholecalciferol (VITAMIN D-3 PO) Take 2,000 Units by mouth daily.  Marland Kitchen. esomeprazole (NEXIUM) 20 MG capsule Take 20 mg  by mouth daily at 12 noon.  . fluticasone (FLONASE) 50 MCG/ACT nasal spray Place 2 sprays into both nostrils daily as needed.  . fluticasone (FLOVENT HFA) 110 MCG/ACT inhaler Inhale 2 puffs into the lungs 2 (two) times daily.  . Multiple Vitamins-Minerals (MULTIVITAMIN PO) Take by mouth.  . Naproxen Sodium (ALEVE PO) Take by mouth as needed.   . Probiotic Product (ALIGN PO) Take 1 capsule by mouth daily.  Marland Kitchen. SYNTHROID 125 MCG tablet Take 1 tablet (125 mcg total) by mouth daily.  Marland Kitchen. azithromycin (ZITHROMAX) 250 MG tablet Take 2 tablets x 1 day and then one tablet per day for four more days.   No facility-administered encounter medications on file as of 12/14/2015.     Review of Systems  Constitutional: Negative for appetite change and unexpected weight change.  HENT: Positive for congestion, postnasal drip and sinus pressure.   Respiratory: Negative for cough, chest tightness and shortness of breath.   Cardiovascular: Negative for chest pain, palpitations and leg swelling.  Gastrointestinal: Negative for diarrhea, nausea and vomiting.  Skin: Negative for color change and rash.  Neurological: Positive for dizziness. Negative for headaches.  Psychiatric/Behavioral: Negative for agitation and dysphoric mood.  Objective:    Physical Exam  Constitutional: She appears well-developed and well-nourished. No distress.  HENT:  Mouth/Throat: Oropharynx is clear and moist.  Nares  - slightly erythematous turbinates.  Minimal tenderness to palpation over the sinuses.  TMs without erythema.    Neck: Neck supple.  Cardiovascular: Normal rate and regular rhythm.   Pulmonary/Chest: Breath sounds normal. No respiratory distress. She has no wheezes.  Lymphadenopathy:    She has no cervical adenopathy.  Skin: No rash noted. No erythema.    BP 120/70   Pulse 65   Temp 97.6 F (36.4 C) (Oral)   Wt 264 lb 12.8 oz (120.1 kg)   LMP 02/01/1993   SpO2 96%   BMI 42.74 kg/m  Wt Readings from Last  3 Encounters:  12/14/15 264 lb 12.8 oz (120.1 kg)  11/03/15 266 lb 3.2 oz (120.7 kg)  03/24/15 264 lb 6 oz (119.9 kg)     Lab Results  Component Value Date   WBC 4.6 09/20/2015   HGB 13.2 09/20/2015   HCT 40.3 09/20/2015   PLT 222.0 09/20/2015   GLUCOSE 94 09/20/2015   CHOL 205 (H) 09/20/2015   TRIG 64.0 09/20/2015   HDL 53.50 09/20/2015   LDLDIRECT 129.8 02/15/2012   LDLCALC 139 (H) 09/20/2015   ALT 9 09/20/2015   AST 16 09/20/2015   NA 140 09/20/2015   K 4.8 09/20/2015   CL 103 09/20/2015   CREATININE 0.73 09/20/2015   BUN 15 09/20/2015   CO2 33 (H) 09/20/2015   TSH 4.39 09/20/2015       Assessment & Plan:   Problem List Items Addressed This Visit    Sinusitis    Symptoms as outlined.  She reports feels similar to her previous sinus infections.  Treat with saline nasal spray and nasacort nasal spray as directed.  mucinex as directed.  If no improvement, zpak as directed.  She report this medication works well for her and tolerates.  Follow.        Relevant Medications   azithromycin (ZITHROMAX) 250 MG tablet       Dale DurhamSCOTT, Linkon Siverson, MD

## 2015-12-14 NOTE — Patient Instructions (Signed)
Saline nasal spray - flush nose at least 2-3x/day  flonase nasal spray - in the evening.    mucinex in the morning and robitussin in the evening.    Continue your probiotic.

## 2015-12-14 NOTE — Progress Notes (Signed)
Pre visit review using our clinic review tool, if applicable. No additional management support is needed unless otherwise documented below in the visit note. 

## 2015-12-19 ENCOUNTER — Encounter: Payer: Self-pay | Admitting: Internal Medicine

## 2015-12-19 DIAGNOSIS — J329 Chronic sinusitis, unspecified: Secondary | ICD-10-CM | POA: Insufficient documentation

## 2015-12-19 NOTE — Assessment & Plan Note (Signed)
Symptoms as outlined.  She reports feels similar to her previous sinus infections.  Treat with saline nasal spray and nasacort nasal spray as directed.  mucinex as directed.  If no improvement, zpak as directed.  She report this medication works well for her and tolerates.  Follow.

## 2016-01-11 ENCOUNTER — Other Ambulatory Visit: Payer: Self-pay | Admitting: Internal Medicine

## 2016-02-02 ENCOUNTER — Ambulatory Visit: Payer: Medicare Other

## 2016-02-08 ENCOUNTER — Other Ambulatory Visit: Payer: Self-pay | Admitting: Internal Medicine

## 2016-02-08 DIAGNOSIS — Z1231 Encounter for screening mammogram for malignant neoplasm of breast: Secondary | ICD-10-CM

## 2016-02-14 ENCOUNTER — Ambulatory Visit (INDEPENDENT_AMBULATORY_CARE_PROVIDER_SITE_OTHER): Payer: Medicare Other

## 2016-02-14 VITALS — BP 122/72 | HR 61 | Temp 97.7°F | Resp 12 | Ht 64.5 in | Wt 267.8 lb

## 2016-02-14 DIAGNOSIS — Z0000 Encounter for general adult medical examination without abnormal findings: Secondary | ICD-10-CM

## 2016-02-14 NOTE — Patient Instructions (Addendum)
  [name] , Thank you for taking time to come for your Medicare Wellness Visit. I appreciate your ongoing commitment to your health goals. Please review the following plan we discussed and let me know if I can assist you in the future.   Follow up with Dr. Lorin PicketScott as needed.  These are the goals we discussed: Goals    . Increase physical activity          Start using the home exercising bike 3 times a week for 20 minute sessions.  Walk more, as tolerated. Chair exercises, as demonstrated.         This is a list of the screening recommended for you and due dates:  Health Maintenance  Topic Date Due  . Mammogram  03/07/2016  . Colon Cancer Screening  03/31/2019  . Tetanus Vaccine  03/09/2020  . Flu Shot  Completed  . DEXA scan (bone density measurement)  Completed  . Shingles Vaccine  Addressed  . Pneumonia vaccines  Completed

## 2016-02-14 NOTE — Progress Notes (Signed)
Subjective:   Jodi Harris is a 76 y.o. female who presents for Medicare Annual (Subsequent) preventive examination.  Review of Systems:  No ROS.  Medicare Wellness Visit. Cardiac Risk Factors include: advanced age (>11men, >45 women);obesity (BMI >30kg/m2);sedentary lifestyle     Objective:     Vitals: BP 122/72 (BP Location: Left Arm, Patient Position: Sitting, Cuff Size: Large)   Pulse 61   Temp 97.7 F (36.5 C) (Oral)   Resp 12   Ht 5' 4.5" (1.638 m)   Wt 267 lb 12.8 oz (121.5 kg)   LMP 02/01/1993   SpO2 96%   BMI 45.26 kg/m   Body mass index is 45.26 kg/m.   Tobacco History  Smoking Status  . Former Smoker  Smokeless Tobacco  . Never Used    Comment: smoked as a teenager     Counseling given: Not Answered   Past Medical History:  Diagnosis Date  . Anemia   . GERD (gastroesophageal reflux disease)   . Hypercholesterolemia   . Hypothyroidism   . Thrombophlebitis    varicosities   Past Surgical History:  Procedure Laterality Date  . COLON SURGERY     laparoscopic assisted transverse colectomy for tubular adenomatous polyp  . LAPAROSCOPIC GASTRIC BANDING    . URETHRAL DIVERTICULUM REPAIR  1974   Dr Jenne Pane   Family History  Problem Relation Age of Onset  . Cancer Father     lymphoma  . Cancer Maternal Aunt     Breast  . Breast cancer Maternal Aunt 50  . Colon cancer      one relative on maternal side   History  Sexual Activity  . Sexual activity: No    Outpatient Encounter Prescriptions as of 02/14/2016  Medication Sig  . Cholecalciferol (VITAMIN D-3 PO) Take 2,000 Units by mouth daily.  Marland Kitchen esomeprazole (NEXIUM) 20 MG capsule Take 20 mg by mouth daily at 12 noon.  . Multiple Vitamins-Minerals (MULTIVITAMIN PO) Take by mouth.  . Probiotic Product (ALIGN PO) Take 1 capsule by mouth daily.  Marland Kitchen SYNTHROID 125 MCG tablet Take 1 tablet (125 mcg total) by mouth daily.  . calcium carbonate (TUMS EX) 750 MG chewable tablet Chew 1 tablet by mouth  as needed.   . fluticasone (FLONASE) 50 MCG/ACT nasal spray Place 2 sprays into both nostrils daily as needed. (Patient not taking: Reported on 02/14/2016)  . fluticasone (FLOVENT HFA) 110 MCG/ACT inhaler Inhale 2 puffs into the lungs 2 (two) times daily. (Patient not taking: Reported on 02/14/2016)  . Naproxen Sodium (ALEVE PO) Take by mouth as needed.   . [DISCONTINUED] azithromycin (ZITHROMAX) 250 MG tablet Take 2 tablets x 1 day and then one tablet per day for four more days.   No facility-administered encounter medications on file as of 02/14/2016.     Activities of Daily Living In your present state of health, do you have any difficulty performing the following activities: 02/14/2016  Hearing? N  Vision? N  Difficulty concentrating or making decisions? N  Walking or climbing stairs? Y  Dressing or bathing? N  Doing errands, shopping? N  Preparing Food and eating ? N  Using the Toilet? N  In the past six months, have you accidently leaked urine? N  Do you have problems with loss of bowel control? N  Managing your Medications? N  Managing your Finances? N  Housekeeping or managing your Housekeeping? N  Some recent data might be hidden    Patient Care Team: Dale West Sullivan, MD  as PCP - General (Internal Medicine)    Assessment:    This is a routine wellness examination for Jodi Harris. The goal of the wellness visit is to assist the patient how to close the gaps in care and create a preventative care plan for the patient.   Taking calcium VIT D as appropriate/Osteoporosis risk reviewed.  Medications reviewed; taking without issues or barriers.  Safety issues reviewed; smoke detectors in the home. No firearms in the home. Wears seatbelts when driving or riding with others. No violence in the home.  No identified risk were noted; The patient was oriented x 3; appropriate in dress and manner and no objective failures at ADL's or IADL's.   BMI; discussed the importance of a healthy  diet, water intake and exercise. Educational material provided.  Health maintenance gaps; closed.  Patient Concerns: None at this time. Follow up with PCP as needed.  Exercise Activities and Dietary recommendations Current Exercise Habits: The patient does not participate in regular exercise at present  Goals    . Increase physical activity          Start using the home exercising bike 3 times a week for 20 minute sessions.  Walk more, as tolerated. Chair exercises, as demonstrated.        Fall Risk Fall Risk  02/14/2016 12/14/2015 11/03/2015 01/29/2015 10/05/2014  Falls in the past year? No No No No No  Number falls in past yr: - - - - -  Injury with Fall? - - - - -  Risk for fall due to : - - - - -   Depression Screen PHQ 2/9 Scores 02/14/2016 12/14/2015 11/03/2015 01/29/2015  PHQ - 2 Score 0 0 0 0     Cognitive Function MMSE - Mini Mental State Exam 01/29/2015  Orientation to time 5  Orientation to Place 5  Registration 3  Attention/ Calculation 5  Recall 3  Language- name 2 objects 2  Language- repeat 1  Language- follow 3 step command 3  Language- read & follow direction 1  Write a sentence 1  Copy design 1  Total score 30     6CIT Screen 02/14/2016  What Year? 0 points  What month? 0 points  What time? 0 points  Count back from 20 0 points  Months in reverse 0 points  Repeat phrase 0 points  Total Score 0    Immunization History  Administered Date(s) Administered  . Influenza Split 12/03/2011, 11/12/2012, 11/13/2013  . Influenza-Unspecified 11/14/2014, 11/04/2015  . Pneumococcal Conjugate-13 04/24/2013  . Pneumococcal Polysaccharide-23 02/02/2012  . Tdap 03/10/2010  . Zoster 07/01/2008   Screening Tests Health Maintenance  Topic Date Due  . MAMMOGRAM  03/07/2016  . COLONOSCOPY  03/31/2019  . TETANUS/TDAP  03/09/2020  . INFLUENZA VACCINE  Completed  . DEXA SCAN  Completed  . ZOSTAVAX  Addressed  . PNA vac Low Risk Adult  Completed      Plan:      End of life planning; Advance aging; Advanced directives discussed. Copy of current HCPOA/Living Will requested.  Medicare Attestation I have personally reviewed: The patient's medical and social history Their use of alcohol, tobacco or illicit drugs Their current medications and supplements The patient's functional ability including ADLs,fall risks, home safety risks, cognitive, and hearing and visual impairment Diet and physical activities Evidence for depression   The patient's weight, height, BMI, and visual acuity have been recorded in the chart.  I have made referrals and provided education to the patient  based on review of the above and I have provided the patient with a written personalized care plan for preventive services.    During the course of the visit the patient was educated and counseled about the following appropriate screening and preventive services:   Vaccines to include Pneumoccal, Influenza, Hepatitis B, Td, Zostavax, HCV  Electrocardiogram  Cardiovascular Disease  Colorectal cancer screening  Bone density screening  Diabetes screening  Glaucoma screening  Mammography/PAP  Nutrition counseling   Patient Instructions (the written plan) was given to the patient.   Ashok Pall, LPN  01/14/1094   Reviewed above information.  Agree with plan.    Dr Lorin Picket

## 2016-03-09 ENCOUNTER — Ambulatory Visit
Admission: RE | Admit: 2016-03-09 | Discharge: 2016-03-09 | Disposition: A | Discharge status: Home / Self Care | Payer: Medicare Other | Source: Ambulatory Visit | Attending: Internal Medicine | Admitting: Internal Medicine

## 2016-03-09 DIAGNOSIS — Z1231 Encounter for screening mammogram for malignant neoplasm of breast: Secondary | ICD-10-CM | POA: Insufficient documentation

## 2016-03-13 ENCOUNTER — Other Ambulatory Visit (INDEPENDENT_AMBULATORY_CARE_PROVIDER_SITE_OTHER): Payer: Medicare Other

## 2016-03-13 DIAGNOSIS — E7800 Pure hypercholesterolemia, unspecified: Secondary | ICD-10-CM

## 2016-03-13 DIAGNOSIS — E039 Hypothyroidism, unspecified: Secondary | ICD-10-CM

## 2016-03-13 LAB — LIPID PANEL
CHOL/HDL RATIO: 4
Cholesterol: 209 mg/dL — ABNORMAL HIGH (ref 0–200)
HDL: 51.6 mg/dL (ref >39.00)
LDL CALC: 143 mg/dL — AB (ref 0–99)
NONHDL: 157.59
Triglycerides: 75 mg/dL (ref 0.0–149.0)
VLDL: 15 mg/dL (ref 0.0–40.0)

## 2016-03-13 LAB — BASIC METABOLIC PANEL
BUN: 12 mg/dL (ref 6–23)
CALCIUM: 9.4 mg/dL (ref 8.4–10.5)
CO2: 32 meq/L (ref 19–32)
Chloride: 105 mEq/L (ref 96–112)
Creatinine, Ser: 0.74 mg/dL (ref 0.40–1.20)
GFR: 81.25 mL/min (ref >60.00)
Glucose, Bld: 97 mg/dL (ref 70–99)
POTASSIUM: 4.6 meq/L (ref 3.5–5.1)
SODIUM: 142 meq/L (ref 135–145)

## 2016-03-13 LAB — HEPATIC FUNCTION PANEL
ALK PHOS: 71 U/L (ref 39–117)
ALT: 9 U/L (ref 0–35)
AST: 14 U/L (ref 0–37)
Albumin: 3.7 g/dL (ref 3.5–5.2)
BILIRUBIN DIRECT: 0.1 mg/dL (ref 0.0–0.3)
BILIRUBIN TOTAL: 0.5 mg/dL (ref 0.2–1.2)
Total Protein: 6.2 g/dL (ref 6.0–8.3)

## 2016-03-13 LAB — TSH: TSH: 5.93 u[IU]/mL — AB (ref 0.35–4.50)

## 2016-03-14 ENCOUNTER — Telehealth: Payer: Self-pay | Admitting: Internal Medicine

## 2016-03-14 NOTE — Telephone Encounter (Signed)
Pt called back and wanted to know if Dr.Scott has called in her increase dosage of SYNTHROID 125 MCG tablet. Please call when completed, thank you!  Pharmacy - [facility] - [place], [place] - [address]  Call pt @ [phone]

## 2016-03-14 NOTE — Telephone Encounter (Signed)
Message sent to dr scott this is duplicate.

## 2016-03-15 ENCOUNTER — Other Ambulatory Visit: Payer: Self-pay | Admitting: Internal Medicine

## 2016-03-15 MED ORDER — LEVOTHYROXINE SODIUM 137 MCG PO TABS: 137.0000 ug | ORAL_TABLET | Freq: Every day | ORAL | 2 refills | Status: DC

## 2016-03-15 NOTE — Progress Notes (Signed)
rx sent in for synthroid q day #30 with 2 refills.

## 2016-03-16 ENCOUNTER — Ambulatory Visit: Payer: Medicare Other | Admitting: Internal Medicine

## 2016-03-20 ENCOUNTER — Ambulatory Visit: Payer: Medicare Other | Admitting: Internal Medicine

## 2016-04-05 ENCOUNTER — Encounter: Payer: Self-pay | Admitting: Internal Medicine

## 2016-04-05 ENCOUNTER — Ambulatory Visit (INDEPENDENT_AMBULATORY_CARE_PROVIDER_SITE_OTHER): Payer: Medicare Other | Admitting: Internal Medicine

## 2016-04-05 DIAGNOSIS — E039 Hypothyroidism, unspecified: Secondary | ICD-10-CM | POA: Diagnosis not present

## 2016-04-05 DIAGNOSIS — E7800 Pure hypercholesterolemia, unspecified: Secondary | ICD-10-CM

## 2016-04-05 DIAGNOSIS — E669 Obesity, unspecified: Secondary | ICD-10-CM

## 2016-04-05 DIAGNOSIS — K219 Gastro-esophageal reflux disease without esophagitis: Secondary | ICD-10-CM | POA: Diagnosis not present

## 2016-04-05 DIAGNOSIS — Z6841 Body Mass Index (BMI) 40.0 and over, adult: Secondary | ICD-10-CM

## 2016-04-05 DIAGNOSIS — E559 Vitamin D deficiency, unspecified: Secondary | ICD-10-CM | POA: Diagnosis not present

## 2016-04-05 DIAGNOSIS — IMO0001 Reserved for inherently not codable concepts without codable children: Secondary | ICD-10-CM

## 2016-04-05 NOTE — Progress Notes (Signed)
Pre-visit discussion using our clinic review tool. No additional management support is needed unless otherwise documented below in the visit note.  

## 2016-04-05 NOTE — Progress Notes (Signed)
Patient ID: Jodi Harris, female   DOB: 1940/02/25, 76 y.o.   MRN: [id number]   Subjective:    Patient ID: Jodi Harris, female    DOB: Nov 04, 1940, 76 y.o.   MRN: [id number]  HPI  Patient here for a scheduled follow up.  She reports she is doing relatively well.  Has joined weight watchers recently.  Has adjusted her diet.  Has lost some weight.  Discussed exercise.  No chest pain.  Breathing stable.  No acid reflux.  No abdominal pain.  Bowels moving.  Overall she feels she is doing relatively well.     Past Medical History:  Diagnosis Date  . Anemia   . GERD (gastroesophageal reflux disease)   . Hypercholesterolemia   . Hypothyroidism   . Thrombophlebitis    varicosities   Past Surgical History:  Procedure Laterality Date  . COLON SURGERY     laparoscopic assisted transverse colectomy for tubular adenomatous polyp  . LAPAROSCOPIC GASTRIC BANDING    . URETHRAL DIVERTICULUM REPAIR  1974   Dr Jenne Pane   Family History  Problem Relation Age of Onset  . Cancer Father     lymphoma  . Cancer Maternal Aunt     Breast  . Breast cancer Maternal Aunt 50  . Colon cancer      one relative on maternal side   Social History   Social History  . Marital status: Married    Spouse name: N/A  . Number of children: 2  . Years of education: N/A   Social History Main Topics  . Smoking status: Former Games developer  . Smokeless tobacco: Never Used     Comment: smoked as a teenager  . Alcohol use 0.0 oz/week     Comment: has a glass of wine about once a month  . Drug use: No  . Sexual activity: No   Other Topics Concern  . None   Social History Narrative  . None    Outpatient Encounter Prescriptions as of 04/05/2016  Medication Sig  . calcium carbonate (TUMS EX) 750 MG chewable tablet Chew 1 tablet by mouth as needed.   . Cholecalciferol (VITAMIN D-3 PO) Take 2,000 Units by mouth daily.  Marland Kitchen esomeprazole (NEXIUM) 20 MG capsule Take 20 mg by mouth daily at 12 noon.  .  fluticasone (FLONASE) 50 MCG/ACT nasal spray Place 2 sprays into both nostrils daily as needed.  . fluticasone (FLOVENT HFA) 110 MCG/ACT inhaler Inhale 2 puffs into the lungs 2 (two) times daily.  Marland Kitchen levothyroxine (SYNTHROID) 137 MCG tablet Take 1 tablet (137 mcg total) by mouth daily before breakfast.  . Multiple Vitamins-Minerals (MULTIVITAMIN PO) Take by mouth.  . Naproxen Sodium (ALEVE PO) Take by mouth as needed.   . Probiotic Product (ALIGN PO) Take 1 capsule by mouth daily.   No facility-administered encounter medications on file as of 04/05/2016.     Review of Systems  Constitutional: Negative for appetite change and unexpected weight change.  HENT: Negative for congestion and sinus pressure.   Respiratory: Negative for cough, chest tightness and shortness of breath.   Cardiovascular: Negative for chest pain, palpitations and leg swelling.  Gastrointestinal: Negative for abdominal pain, diarrhea, nausea and vomiting.  Genitourinary: Negative for difficulty urinating and dysuria.  Musculoskeletal: Negative for joint swelling and myalgias.  Skin: Negative for color change and rash.  Neurological: Negative for dizziness, light-headedness and headaches.  Psychiatric/Behavioral: Negative for agitation and dysphoric mood.       Objective:  Blood pressure rechecked by me:  118/68  Physical Exam  Constitutional: She appears well-developed and well-nourished. No distress.  HENT:  Nose: Nose normal.  Mouth/Throat: Oropharynx is clear and moist.  Neck: Neck supple. No thyromegaly present.  Cardiovascular: Normal rate and regular rhythm.   Pulmonary/Chest: Breath sounds normal. No respiratory distress. She has no wheezes.  Abdominal: Soft. Bowel sounds are normal. There is no tenderness.  Musculoskeletal: She exhibits no edema or tenderness.  Lymphadenopathy:    She has no cervical adenopathy.  Skin: No rash noted. No erythema.  Psychiatric: She has a normal mood and affect. Her  behavior is normal.    BP 108/62   Pulse 65   Temp 97.7 F (36.5 C) (Oral)   Resp 12   Ht 5\' 5"  (1.651 m)   Wt 262 lb 4 oz (119 kg)   LMP 02/01/1993   SpO2 97%   BMI 43.64 kg/m  Wt Readings from Last 3 Encounters:  04/05/16 262 lb 4 oz (119 kg)  02/14/16 267 lb 12.8 oz (121.5 kg)  12/14/15 264 lb 12.8 oz (120.1 kg)     Lab Results  Component Value Date   WBC 4.6 09/20/2015   HGB 13.2 09/20/2015   HCT 40.3 09/20/2015   PLT 222.0 09/20/2015   GLUCOSE 97 03/13/2016   CHOL 209 (H) 03/13/2016   TRIG 75.0 03/13/2016   HDL 51.60 03/13/2016   LDLDIRECT 129.8 02/15/2012   LDLCALC 143 (H) 03/13/2016   ALT 9 03/13/2016   AST 14 03/13/2016   NA 142 03/13/2016   K 4.6 03/13/2016   CL 105 03/13/2016   CREATININE 0.74 03/13/2016   BUN 12 03/13/2016   CO2 32 03/13/2016   TSH 5.93 (H) 03/13/2016    Mm Screening Breast Tomo Bilateral  Result Date: 03/09/2016 CLINICAL DATA:  Screening. EXAM: 2D DIGITAL SCREENING BILATERAL MAMMOGRAM WITH CAD AND ADJUNCT TOMO COMPARISON:  Previous exam(s). ACR Breast Density Category b: There are scattered areas of fibroglandular density. FINDINGS: There are no findings suspicious for malignancy. Images were processed with CAD. IMPRESSION: No mammographic evidence of malignancy. A result letter of this screening mammogram will be mailed directly to the patient. RECOMMENDATION: Screening mammogram in one year. (Code:SM-B-01Y) BI-RADS CATEGORY  1: Negative. Electronically Signed   By: Dalphine HandingErin  Shaw M.D.   On: 03/09/2016 14:28       Assessment & Plan:   Problem List Items Addressed This Visit    GERD (gastroesophageal reflux disease)    Controlled on nexium.       Hypercholesterolemia    Discussed recent labs.  Discussed elevated LDL - 140s.  Low cholesterol diet and exercise.  Follow lipid panel.        Hypothyroidism    On thyroid replacement.  Follow tsh.       Obesity    Diet and exercise.  Follow.  Has joined [employer].  Has lost some  weight.  Follow.        Vitamin D deficiency    Continue vitamin D supplements.  Follow vitamin D level.            Dale DurhamSCOTT, Yoltzin Barg, MD

## 2016-04-08 ENCOUNTER — Encounter: Payer: Self-pay | Admitting: Internal Medicine

## 2016-04-08 NOTE — Assessment & Plan Note (Signed)
Discussed recent labs.  Discussed elevated LDL - 140s.  Low cholesterol diet and exercise.  Follow lipid panel.

## 2016-04-08 NOTE — Assessment & Plan Note (Signed)
Diet and exercise.  Follow.  Has joined [employer].  Has lost some weight.  Follow.

## 2016-04-08 NOTE — Assessment & Plan Note (Signed)
Continue vitamin D supplements.  Follow vitamin D level.   

## 2016-04-08 NOTE — Assessment & Plan Note (Signed)
Controlled on nexium.  

## 2016-04-08 NOTE — Assessment & Plan Note (Signed)
On thyroid replacement.  Follow tsh.  

## 2016-04-21 ENCOUNTER — Telehealth: Payer: Self-pay | Admitting: Radiology

## 2016-04-21 ENCOUNTER — Other Ambulatory Visit: Payer: Self-pay | Admitting: Internal Medicine

## 2016-04-21 DIAGNOSIS — E039 Hypothyroidism, unspecified: Secondary | ICD-10-CM

## 2016-04-21 NOTE — Telephone Encounter (Signed)
Pt coming in for labs Monday, please place future orders. Thank you 

## 2016-04-21 NOTE — Progress Notes (Signed)
Order placed for f/u tsh.  

## 2016-04-24 ENCOUNTER — Other Ambulatory Visit (INDEPENDENT_AMBULATORY_CARE_PROVIDER_SITE_OTHER): Payer: Medicare Other

## 2016-04-24 ENCOUNTER — Other Ambulatory Visit: Payer: Medicare Other

## 2016-04-24 DIAGNOSIS — E039 Hypothyroidism, unspecified: Secondary | ICD-10-CM

## 2016-04-24 LAB — TSH: TSH: 3.34 u[IU]/mL (ref 0.35–4.50)

## 2016-05-12 ENCOUNTER — Other Ambulatory Visit: Payer: Self-pay

## 2016-05-12 MED ORDER — LEVOTHYROXINE SODIUM 137 MCG PO TABS: 137.0000 ug | ORAL_TABLET | Freq: Every day | ORAL | 1 refills | Status: DC

## 2016-10-10 ENCOUNTER — Encounter: Payer: Medicare Other | Admitting: Internal Medicine

## 2016-10-12 ENCOUNTER — Encounter: Payer: Self-pay | Admitting: Internal Medicine

## 2016-10-12 ENCOUNTER — Ambulatory Visit (INDEPENDENT_AMBULATORY_CARE_PROVIDER_SITE_OTHER): Payer: Medicare Other | Admitting: Internal Medicine

## 2016-10-12 VITALS — BP 112/68 | HR 64 | Temp 97.8°F | Resp 16 | Ht 64.96 in | Wt 261.6 lb

## 2016-10-12 DIAGNOSIS — E7800 Pure hypercholesterolemia, unspecified: Secondary | ICD-10-CM

## 2016-10-12 DIAGNOSIS — L989 Disorder of the skin and subcutaneous tissue, unspecified: Secondary | ICD-10-CM | POA: Diagnosis not present

## 2016-10-12 DIAGNOSIS — R42 Dizziness and giddiness: Secondary | ICD-10-CM

## 2016-10-12 DIAGNOSIS — E039 Hypothyroidism, unspecified: Secondary | ICD-10-CM

## 2016-10-12 DIAGNOSIS — Z0000 Encounter for general adult medical examination without abnormal findings: Secondary | ICD-10-CM

## 2016-10-12 DIAGNOSIS — K219 Gastro-esophageal reflux disease without esophagitis: Secondary | ICD-10-CM | POA: Diagnosis not present

## 2016-10-12 DIAGNOSIS — Z6841 Body Mass Index (BMI) 40.0 and over, adult: Secondary | ICD-10-CM

## 2016-10-12 DIAGNOSIS — Z0001 Encounter for general adult medical examination with abnormal findings: Secondary | ICD-10-CM | POA: Diagnosis not present

## 2016-10-12 DIAGNOSIS — E559 Vitamin D deficiency, unspecified: Secondary | ICD-10-CM | POA: Diagnosis not present

## 2016-10-12 MED ORDER — LEVOTHYROXINE SODIUM 137 MCG PO TABS: 137.0000 ug | ORAL_TABLET | Freq: Every day | ORAL | 1 refills | Status: DC

## 2016-10-12 MED ORDER — FLUTICASONE PROPIONATE 50 MCG/ACT NA SUSP: 2.0000 | Freq: Every day | NASAL | 5 refills | Status: DC | PRN

## 2016-10-12 MED ORDER — MUPIROCIN 2 % EX OINT: TOPICAL_OINTMENT | CUTANEOUS | 0 refills | Status: DC

## 2016-10-12 NOTE — Progress Notes (Signed)
Patient ID: Jodi Harris, female   DOB: March 13, 1940, 76 y.o.   MRN: [id number]   Subjective:    Patient ID: Jodi Harris, female    DOB: [birth year], 75 y.o.   MRN: [id number]  HPI  Patient here for her physical exam.  She reports she is doing relatively well.  States has noticed some mild dizziness when lying in bed and turns head.  Just started recently.  No significant dizziness.  Has had vertigo previously.  No other dizziness.  No headache.  No significant congestion.  No chest pain.  No sob.  No acid reflux.  No abdominal pain.  Bowels moving.  Right arm lesion.  Persistent.  Discussed diet and exercise.     Past Medical History:  Diagnosis Date  . Anemia   . GERD (gastroesophageal reflux disease)   . Hypercholesterolemia   . Hypothyroidism   . Thrombophlebitis    varicosities   Past Surgical History:  Procedure Laterality Date  . COLON SURGERY     laparoscopic assisted transverse colectomy for tubular adenomatous polyp  . LAPAROSCOPIC GASTRIC BANDING    . URETHRAL DIVERTICULUM REPAIR  1974   Dr Jenne Pane   Family History  Problem Relation Age of Onset  . Cancer Father        lymphoma  . Cancer Maternal Aunt        Breast  . Breast cancer Maternal Aunt 50  . Colon cancer Unknown        one relative on maternal side   Social History   Social History  . Marital status: Married    Spouse name: N/A  . Number of children: 2  . Years of education: N/A   Social History Main Topics  . Smoking status: Former Games developer  . Smokeless tobacco: Never Used     Comment: smoked as a teenager  . Alcohol use 0.0 oz/week     Comment: has a glass of wine about once a month  . Drug use: No  . Sexual activity: No   Other Topics Concern  . None   Social History Narrative  . None    Outpatient Encounter Prescriptions as of 10/12/2016  Medication Sig  . calcium carbonate (TUMS EX) 750 MG chewable tablet Chew 1 tablet by mouth as needed.   . Cholecalciferol (VITAMIN D-3  PO) Take 2,000 Units by mouth daily.  Marland Kitchen esomeprazole (NEXIUM) 20 MG capsule Take 20 mg by mouth daily at 12 noon.  . fluticasone (FLONASE) 50 MCG/ACT nasal spray Place 2 sprays into both nostrils daily as needed.  Marland Kitchen levothyroxine (SYNTHROID) 137 MCG tablet Take 1 tablet (137 mcg total) by mouth daily before breakfast.  . Multiple Vitamins-Minerals (MULTIVITAMIN PO) Take by mouth.  . Naproxen Sodium (ALEVE PO) Take by mouth as needed.   . Probiotic Product (ALIGN PO) Take 1 capsule by mouth daily.  . [DISCONTINUED] fluticasone (FLONASE) 50 MCG/ACT nasal spray Place 2 sprays into both nostrils daily as needed.  . [DISCONTINUED] levothyroxine (SYNTHROID) 137 MCG tablet Take 1 tablet (137 mcg total) by mouth daily before breakfast.  . mupirocin ointment (BACTROBAN) 2 % Apply to affected area bid.  . [DISCONTINUED] fluticasone (FLOVENT HFA) 110 MCG/ACT inhaler Inhale 2 puffs into the lungs 2 (two) times daily.   No facility-administered encounter medications on file as of 10/12/2016.     Review of Systems  Constitutional: Negative for appetite change and unexpected weight change.  HENT: Negative for congestion and sinus pressure.   Eyes: Negative  for pain and visual disturbance.  Respiratory: Negative for cough, chest tightness and shortness of breath.   Cardiovascular: Negative for chest pain, palpitations and leg swelling.  Gastrointestinal: Negative for abdominal pain, diarrhea, nausea and vomiting.  Genitourinary: Negative for difficulty urinating and dysuria.  Musculoskeletal: Negative for back pain and joint swelling.  Skin: Negative for color change and rash.  Neurological: Positive for dizziness. Negative for headaches.  Hematological: Negative for adenopathy. Does not bruise/bleed easily.  Psychiatric/Behavioral: Negative for agitation and dysphoric mood.       Objective:     Blood pressure rechecked by me:  128/68  Physical Exam  Constitutional: She is oriented to person,  place, and time. She appears well-developed and well-nourished. No distress.  HENT:  Nose: Nose normal.  Mouth/Throat: Oropharynx is clear and moist.  Eyes: Right eye exhibits no discharge. Left eye exhibits no discharge. No scleral icterus.  Neck: Neck supple. No thyromegaly present.  Cardiovascular: Normal rate and regular rhythm.   Pulmonary/Chest: Breath sounds normal. No accessory muscle usage. No tachypnea. No respiratory distress. She has no decreased breath sounds. She has no wheezes. She has no rhonchi. Right breast exhibits no inverted nipple, no mass, no nipple discharge and no tenderness (no axillary adenopathy). Left breast exhibits no inverted nipple, no mass, no nipple discharge and no tenderness (no axilarry adenopathy).  Abdominal: Soft. Bowel sounds are normal. There is no tenderness.  Musculoskeletal: She exhibits no edema or tenderness.  Lymphadenopathy:    She has no cervical adenopathy.  Neurological: She is alert and oriented to person, place, and time.  Skin: Skin is warm. No rash noted. No erythema.  Psychiatric: She has a normal mood and affect. Her behavior is normal.    BP 112/68 (BP Location: Left Arm, Patient Position: Sitting, Cuff Size: Large)   Pulse 64   Temp 97.8 F (36.6 C) (Oral)   Resp 16   Ht 5' 4.96" (1.65 m)   Wt 261 lb 9.6 oz (118.7 kg)   LMP 02/01/1993   SpO2 97%   BMI 43.58 kg/m  Wt Readings from Last 3 Encounters:  10/12/16 261 lb 9.6 oz (118.7 kg)  04/05/16 262 lb 4 oz (119 kg)  02/14/16 267 lb 12.8 oz (121.5 kg)     Lab Results  Component Value Date   WBC 4.6 09/20/2015   HGB 13.2 09/20/2015   HCT 40.3 09/20/2015   PLT 222.0 09/20/2015   GLUCOSE 97 03/13/2016   CHOL 209 (H) 03/13/2016   TRIG 75.0 03/13/2016   HDL 51.60 03/13/2016   LDLDIRECT 129.8 02/15/2012   LDLCALC 143 (H) 03/13/2016   ALT 9 03/13/2016   AST 14 03/13/2016   NA 142 03/13/2016   K 4.6 03/13/2016   CL 105 03/13/2016   CREATININE 0.74 03/13/2016   BUN  12 03/13/2016   CO2 32 03/13/2016   TSH 3.34 04/24/2016    Mm Screening Breast Tomo Bilateral  Result Date: 03/09/2016 CLINICAL DATA:  Screening. EXAM: 2D DIGITAL SCREENING BILATERAL MAMMOGRAM WITH CAD AND ADJUNCT TOMO COMPARISON:  Previous exam(s). ACR Breast Density Category b: There are scattered areas of fibroglandular density. FINDINGS: There are no findings suspicious for malignancy. Images were processed with CAD. IMPRESSION: No mammographic evidence of malignancy. A result letter of this screening mammogram will be mailed directly to the patient. RECOMMENDATION: Screening mammogram in one year. (Code:SM-B-01Y) BI-RADS CATEGORY  1: Negative. Electronically Signed   By: Dalphine Handing M.D.   On: 03/09/2016 14:28  Assessment & Plan:   Problem List Items Addressed This Visit    BMI 40.0-44.9, adult (HCC)    Discussed diet and exercise.  Follow.        GERD (gastroesophageal reflux disease)    Controlled on current regimen.        Health care maintenance    Physical today 10/12/16.  Mammogram 03/09/16 - Birads I.  Colonoscopy 03/31/14 - as outlined.  Recommended f/u in five years.        Hypercholesterolemia    Low cholesterol diet and exercise.  Follow lipid panel.        Relevant Orders   Hepatic function panel   Lipid panel   Basic metabolic panel   Hypothyroidism    On thyroid replacement.  Follow tsh.        Relevant Medications   levothyroxine (SYNTHROID) 137 MCG tablet   Other Relevant Orders   CBC with Differential/Platelet   TSH   Vitamin D deficiency    Follow vitamin d level.        Other Visit Diagnoses    Routine general medical examination at a health care facility    -  Primary   Skin lesion       Persistent.  Bactroban as directed.  Notify me if persistent and will refer to Dermatology.     Dizziness       Occurs when turns head when lying in bed.  Not occurring at other times.  Desires no further evaluation at this time.  Follow.  Notify if  persistent.        Dale Morada, MD

## 2016-10-12 NOTE — Assessment & Plan Note (Addendum)
Physical today 10/12/16.  Mammogram 03/09/16 - Birads I.  Colonoscopy 03/31/14 - as outlined.  Recommended f/u in five years.

## 2016-10-15 ENCOUNTER — Encounter: Payer: Self-pay | Admitting: Internal Medicine

## 2016-10-15 NOTE — Assessment & Plan Note (Signed)
Discussed diet and exercise.  Follow.  

## 2016-10-15 NOTE — Assessment & Plan Note (Signed)
Low cholesterol diet and exercise.  Follow lipid panel.   

## 2016-10-15 NOTE — Assessment & Plan Note (Signed)
Controlled on current regimen.   

## 2016-10-15 NOTE — Assessment & Plan Note (Signed)
Follow vitamin d level.   

## 2016-10-15 NOTE — Assessment & Plan Note (Signed)
On thyroid replacement.  Follow tsh.  

## 2016-10-16 ENCOUNTER — Telehealth: Payer: Self-pay | Admitting: Internal Medicine

## 2016-10-16 NOTE — Telephone Encounter (Signed)
Patient is having sinus congestion and dizziness requesting Z-pack. Patient says she was seen by you on Thursday and this was mentioned but wanted to see if it would run its course. Patient is not having any acute symptoms. Please advise.

## 2016-10-16 NOTE — Telephone Encounter (Signed)
Pt called and is c/o dizziness and congestion. Pt would like to know if a zpack can be called in. Please advise, thank you!  Call pt @ [phone]  Pharmacy - [facility] - [place],  - [address]

## 2016-10-17 MED ORDER — AZITHROMYCIN 250 MG PO TABS: ORAL_TABLET | ORAL | 0 refills | Status: DC

## 2016-10-17 NOTE — Telephone Encounter (Signed)
Patient is aware has been advised to be re evaluated if symptoms persist.

## 2016-10-17 NOTE — Telephone Encounter (Signed)
I have sent in rx for zpak.  She is concerned dizziness related to sinus infection.  If dizziness and symptoms persist, she needs to be reevaluated.  Per note, note acute change.  Also notify her to take a probiotic while on the zpak and for two weeks after completing the antibiotic.

## 2016-10-23 ENCOUNTER — Other Ambulatory Visit (INDEPENDENT_AMBULATORY_CARE_PROVIDER_SITE_OTHER): Payer: Medicare Other

## 2016-10-23 DIAGNOSIS — E7800 Pure hypercholesterolemia, unspecified: Secondary | ICD-10-CM

## 2016-10-23 DIAGNOSIS — E039 Hypothyroidism, unspecified: Secondary | ICD-10-CM | POA: Diagnosis not present

## 2016-10-23 LAB — HEPATIC FUNCTION PANEL
ALT: 7 U/L (ref 0–35)
AST: 12 U/L (ref 0–37)
Albumin: 3.6 g/dL (ref 3.5–5.2)
Alkaline Phosphatase: 65 U/L (ref 39–117)
BILIRUBIN DIRECT: 0.1 mg/dL (ref 0.0–0.3)
BILIRUBIN TOTAL: 0.5 mg/dL (ref 0.2–1.2)
Total Protein: 6.5 g/dL (ref 6.0–8.3)

## 2016-10-23 LAB — BASIC METABOLIC PANEL
BUN: 18 mg/dL (ref 6–23)
CALCIUM: 9.1 mg/dL (ref 8.4–10.5)
CO2: 31 meq/L (ref 19–32)
CREATININE: 0.7 mg/dL (ref 0.40–1.20)
Chloride: 103 mEq/L (ref 96–112)
GFR: 86.49 mL/min (ref >60.00)
GLUCOSE: 85 mg/dL (ref 70–99)
Potassium: 4.2 mEq/L (ref 3.5–5.1)
SODIUM: 140 meq/L (ref 135–145)

## 2016-10-23 LAB — CBC WITH DIFFERENTIAL/PLATELET
BASOS ABS: 0 10*3/uL (ref 0.0–0.1)
Basophils Relative: 1 % (ref 0.0–3.0)
EOS ABS: 0.1 10*3/uL (ref 0.0–0.7)
Eosinophils Relative: 2.5 % (ref 0.0–5.0)
HCT: 41.7 % (ref 36.0–46.0)
HEMOGLOBIN: 13.4 g/dL (ref 12.0–15.0)
LYMPHS ABS: 1.4 10*3/uL (ref 0.7–4.0)
Lymphocytes Relative: 29.8 % (ref 12.0–46.0)
MCHC: 32.1 g/dL (ref 30.0–36.0)
MCV: 90.3 fl (ref 78.0–100.0)
MONO ABS: 0.4 10*3/uL (ref 0.1–1.0)
Monocytes Relative: 8.2 % (ref 3.0–12.0)
NEUTROS PCT: 58.5 % (ref 43.0–77.0)
Neutro Abs: 2.7 10*3/uL (ref 1.4–7.7)
Platelets: 227 10*3/uL (ref 150.0–400.0)
RBC: 4.62 Mil/uL (ref 3.87–5.11)
RDW: 14.6 % (ref 11.5–15.5)
WBC: 4.6 10*3/uL (ref 4.0–10.5)

## 2016-10-23 LAB — LIPID PANEL
CHOL/HDL RATIO: 4
CHOLESTEROL: 201 mg/dL — AB (ref 0–200)
HDL: 50.1 mg/dL (ref >39.00)
LDL CALC: 134 mg/dL — AB (ref 0–99)
NonHDL: 150.93
Triglycerides: 83 mg/dL (ref 0.0–149.0)
VLDL: 16.6 mg/dL (ref 0.0–40.0)

## 2016-10-23 LAB — TSH: TSH: 0.66 u[IU]/mL (ref 0.35–4.50)

## 2016-10-24 ENCOUNTER — Other Ambulatory Visit: Payer: Medicare Other

## 2016-10-24 MED ORDER — ROSUVASTATIN CALCIUM 10 MG PO TABS: 10.0000 mg | ORAL_TABLET | Freq: Every day | ORAL | 1 refills | Status: DC

## 2016-10-24 NOTE — Addendum Note (Signed)
Addended by: Donnamarie Poag on: 10/24/2016 04:38 PM   Modules accepted: Orders

## 2016-10-26 ENCOUNTER — Other Ambulatory Visit: Payer: Medicare Other

## 2016-12-05 ENCOUNTER — Other Ambulatory Visit: Payer: Self-pay | Admitting: Internal Medicine

## 2016-12-05 ENCOUNTER — Telehealth: Payer: Self-pay | Admitting: Radiology

## 2016-12-05 DIAGNOSIS — E7800 Pure hypercholesterolemia, unspecified: Secondary | ICD-10-CM

## 2016-12-05 DIAGNOSIS — E559 Vitamin D deficiency, unspecified: Secondary | ICD-10-CM

## 2016-12-05 NOTE — Telephone Encounter (Signed)
Order placed for liver panel.  

## 2016-12-05 NOTE — Telephone Encounter (Signed)
PT coming in for labs tomorrow, please place future orders. Thank you.  

## 2016-12-05 NOTE — Progress Notes (Signed)
Order placed for f/u lab.   

## 2016-12-06 ENCOUNTER — Other Ambulatory Visit (INDEPENDENT_AMBULATORY_CARE_PROVIDER_SITE_OTHER): Payer: Medicare Other

## 2016-12-06 DIAGNOSIS — E7800 Pure hypercholesterolemia, unspecified: Secondary | ICD-10-CM

## 2016-12-06 LAB — HEPATIC FUNCTION PANEL
ALT: 10 U/L (ref 0–35)
AST: 15 U/L (ref 0–37)
Albumin: 3.8 g/dL (ref 3.5–5.2)
Alkaline Phosphatase: 64 U/L (ref 39–117)
BILIRUBIN TOTAL: 0.6 mg/dL (ref 0.2–1.2)
Bilirubin, Direct: 0.1 mg/dL (ref 0.0–0.3)
TOTAL PROTEIN: 6.7 g/dL (ref 6.0–8.3)

## 2016-12-07 ENCOUNTER — Other Ambulatory Visit: Payer: Self-pay | Admitting: Internal Medicine

## 2016-12-07 DIAGNOSIS — E7800 Pure hypercholesterolemia, unspecified: Secondary | ICD-10-CM

## 2016-12-07 DIAGNOSIS — E559 Vitamin D deficiency, unspecified: Secondary | ICD-10-CM

## 2016-12-07 DIAGNOSIS — E039 Hypothyroidism, unspecified: Secondary | ICD-10-CM

## 2016-12-07 NOTE — Progress Notes (Signed)
Orders placed for f/u labs.  

## 2017-01-31 ENCOUNTER — Other Ambulatory Visit: Payer: Self-pay | Admitting: Internal Medicine

## 2017-01-31 DIAGNOSIS — Z1231 Encounter for screening mammogram for malignant neoplasm of breast: Secondary | ICD-10-CM

## 2017-02-12 ENCOUNTER — Other Ambulatory Visit (INDEPENDENT_AMBULATORY_CARE_PROVIDER_SITE_OTHER): Payer: Medicare Other

## 2017-02-12 DIAGNOSIS — E039 Hypothyroidism, unspecified: Secondary | ICD-10-CM | POA: Diagnosis not present

## 2017-02-12 DIAGNOSIS — E559 Vitamin D deficiency, unspecified: Secondary | ICD-10-CM | POA: Diagnosis not present

## 2017-02-12 DIAGNOSIS — E7800 Pure hypercholesterolemia, unspecified: Secondary | ICD-10-CM | POA: Diagnosis not present

## 2017-02-12 LAB — BASIC METABOLIC PANEL
BUN: 23 mg/dL (ref 6–23)
CALCIUM: 9.2 mg/dL (ref 8.4–10.5)
CO2: 32 meq/L (ref 19–32)
CREATININE: 0.77 mg/dL (ref 0.40–1.20)
Chloride: 103 mEq/L (ref 96–112)
GFR: 77.41 mL/min (ref >60.00)
GLUCOSE: 98 mg/dL (ref 70–99)
Potassium: 5 mEq/L (ref 3.5–5.1)
Sodium: 140 mEq/L (ref 135–145)

## 2017-02-12 LAB — HEPATIC FUNCTION PANEL
ALT: 8 U/L (ref 0–35)
AST: 14 U/L (ref 0–37)
Albumin: 3.8 g/dL (ref 3.5–5.2)
Alkaline Phosphatase: 68 U/L (ref 39–117)
BILIRUBIN DIRECT: 0.1 mg/dL (ref 0.0–0.3)
TOTAL PROTEIN: 6.9 g/dL (ref 6.0–8.3)
Total Bilirubin: 0.6 mg/dL (ref 0.2–1.2)

## 2017-02-12 LAB — LIPID PANEL
CHOL/HDL RATIO: 2
Cholesterol: 144 mg/dL (ref 0–200)
HDL: 65.6 mg/dL (ref >39.00)
LDL Cholesterol: 66 mg/dL (ref 0–99)
NONHDL: 77.96
TRIGLYCERIDES: 60 mg/dL (ref 0.0–149.0)
VLDL: 12 mg/dL (ref 0.0–40.0)

## 2017-02-12 LAB — TSH: TSH: 1.84 u[IU]/mL (ref 0.35–4.50)

## 2017-02-12 LAB — VITAMIN D 25 HYDROXY (VIT D DEFICIENCY, FRACTURES): VITD: 60.12 ng/mL (ref 30.00–100.00)

## 2017-02-14 ENCOUNTER — Other Ambulatory Visit: Payer: Medicare Other

## 2017-02-14 ENCOUNTER — Ambulatory Visit: Payer: Medicare Other

## 2017-02-15 ENCOUNTER — Encounter: Payer: Self-pay | Admitting: Internal Medicine

## 2017-02-15 ENCOUNTER — Ambulatory Visit: Payer: Medicare Other

## 2017-02-15 ENCOUNTER — Ambulatory Visit: Payer: Medicare Other | Admitting: Internal Medicine

## 2017-02-15 VITALS — BP 118/68 | HR 59 | Temp 97.7°F | Resp 16 | Ht 64.0 in | Wt 262.0 lb

## 2017-02-15 VITALS — BP 118/68 | HR 59 | Temp 97.7°F | Resp 16 | Wt 262.1 lb

## 2017-02-15 DIAGNOSIS — L989 Disorder of the skin and subcutaneous tissue, unspecified: Secondary | ICD-10-CM

## 2017-02-15 DIAGNOSIS — E7800 Pure hypercholesterolemia, unspecified: Secondary | ICD-10-CM

## 2017-02-15 DIAGNOSIS — Z0000 Encounter for general adult medical examination without abnormal findings: Secondary | ICD-10-CM

## 2017-02-15 DIAGNOSIS — K219 Gastro-esophageal reflux disease without esophagitis: Secondary | ICD-10-CM

## 2017-02-15 DIAGNOSIS — E559 Vitamin D deficiency, unspecified: Secondary | ICD-10-CM | POA: Diagnosis not present

## 2017-02-15 DIAGNOSIS — Z6841 Body Mass Index (BMI) 40.0 and over, adult: Secondary | ICD-10-CM | POA: Diagnosis not present

## 2017-02-15 DIAGNOSIS — E039 Hypothyroidism, unspecified: Secondary | ICD-10-CM

## 2017-02-15 MED ORDER — LEVOTHYROXINE SODIUM 137 MCG PO TABS: 137.0000 ug | ORAL_TABLET | Freq: Every day | ORAL | 3 refills | Status: DC

## 2017-02-15 MED ORDER — ROSUVASTATIN CALCIUM 5 MG PO TABS: 5.0000 mg | ORAL_TABLET | Freq: Every day | ORAL | 3 refills | Status: DC

## 2017-02-15 NOTE — Patient Instructions (Addendum)
  Ms. Ander GasterHorner , Thank you for taking time to come for your Medicare Wellness Visit. I appreciate your ongoing commitment to your health goals. Please review the following plan we discussed and let me know if I can assist you in the future.   These are the goals we discussed: Goals    . DIET - INCREASE LEAN PROTEINS     Low carb foods    . Increase physical activity     Walk for exercise       This is a list of the screening recommended for you and due dates:  Health Maintenance  Topic Date Due  . Mammogram  03/09/2017  . Colon Cancer Screening  03/31/2019  . Tetanus Vaccine  03/09/2020  . Flu Shot  Completed  . DEXA scan (bone density measurement)  Completed  . Pneumonia vaccines  Completed

## 2017-02-15 NOTE — Progress Notes (Signed)
Patient ID: Jodi Harris, female   DOB: 1940-06-17, 77 y.o.   MRN: [id number]   Subjective:    Patient ID: Jodi Harris, female    DOB: 17-Jun-1940, 77 y.o.   MRN: [id number]  HPI  Patient here for a scheduled follow up.  She reports she is doing relatively well.  Persistent right arm lesion.  Does feel is better.  No chest pain.  Breathing stable.  Discussed diet and exercise.  No acid reflux.  No abdominal pain or cramping.  Bowels stable.     Past Medical History:  Diagnosis Date  . Anemia   . GERD (gastroesophageal reflux disease)   . Hypercholesterolemia   . Hypothyroidism   . Thrombophlebitis    varicosities   Past Surgical History:  Procedure Laterality Date  . COLON SURGERY     laparoscopic assisted transverse colectomy for tubular adenomatous polyp  . LAPAROSCOPIC GASTRIC BANDING    . URETHRAL DIVERTICULUM REPAIR  1974   Dr Jenne Pane   Family History  Problem Relation Age of Onset  . Cancer Father        lymphoma  . Cancer Maternal Aunt        Breast  . Breast cancer Maternal Aunt 50  . Colon cancer Unknown        one relative on maternal side   Social History   Socioeconomic History  . Marital status: Married    Spouse name: None  . Number of children: 2  . Years of education: None  . Highest education level: None  Social Needs  . Financial resource strain: None  . Food insecurity - worry: None  . Food insecurity - inability: None  . Transportation needs - medical: None  . Transportation needs - non-medical: None  Occupational History  . None  Tobacco Use  . Smoking status: Former Games developer  . Smokeless tobacco: Never Used  . Tobacco comment: smoked as a teenager  Substance and Sexual Activity  . Alcohol use: Yes    Alcohol/week: 0.0 oz    Comment: has a glass of wine about once a month  . Drug use: No  . Sexual activity: No  Other Topics Concern  . None  Social History Narrative  . None    Outpatient Encounter Medications as of  02/15/2017  Medication Sig  . calcium carbonate (TUMS EX) 750 MG chewable tablet Chew 1 tablet by mouth as needed.   . Cholecalciferol (VITAMIN D-3 PO) Take 2,000 Units by mouth daily.  Marland Kitchen esomeprazole (NEXIUM) 20 MG capsule Take 20 mg by mouth daily at 12 noon.  . fluticasone (FLONASE) 50 MCG/ACT nasal spray Place 2 sprays into both nostrils daily as needed.  Marland Kitchen levothyroxine (SYNTHROID) 137 MCG tablet Take 1 tablet (137 mcg total) by mouth daily before breakfast.  . mupirocin ointment (BACTROBAN) 2 % Apply to affected area bid.  . Naproxen Sodium (ALEVE PO) Take by mouth as needed.   . Probiotic Product (ALIGN PO) Take 1 capsule by mouth daily.  . [DISCONTINUED] levothyroxine (SYNTHROID) 137 MCG tablet Take 1 tablet (137 mcg total) by mouth daily before breakfast.  . [DISCONTINUED] rosuvastatin (CRESTOR) 10 MG tablet Take 1 tablet (10 mg total) by mouth daily.  . rosuvastatin (CRESTOR) 5 MG tablet Take 1 tablet (5 mg total) by mouth daily.  . [DISCONTINUED] azithromycin (ZITHROMAX) 250 MG tablet Take two tablets x 1 day and then one tablet per day for four more days. (Patient not taking: Reported on 02/15/2017)  . [  DISCONTINUED] Multiple Vitamins-Minerals (MULTIVITAMIN PO) Take by mouth.   No facility-administered encounter medications on file as of 02/15/2017.     Review of Systems  Constitutional: Negative for appetite change and unexpected weight change.  HENT: Negative for congestion and sinus pressure.   Respiratory: Negative for cough, chest tightness and shortness of breath.   Cardiovascular: Negative for chest pain, palpitations and leg swelling.  Gastrointestinal: Negative for abdominal pain, diarrhea, nausea and vomiting.  Genitourinary: Negative for difficulty urinating and dysuria.  Musculoskeletal: Negative for joint swelling and myalgias.  Skin: Negative for color change and rash.  Neurological: Negative for dizziness, light-headedness and headaches.  Psychiatric/Behavioral:  Negative for agitation and dysphoric mood.       Objective:    Physical Exam  Constitutional: She appears well-developed and well-nourished. No distress.  HENT:  Nose: Nose normal.  Mouth/Throat: Oropharynx is clear and moist.  Neck: Neck supple. No thyromegaly present.  Cardiovascular: Normal rate and regular rhythm.  Pulmonary/Chest: Breath sounds normal. No respiratory distress. She has no wheezes.  Abdominal: Soft. Bowel sounds are normal. There is no tenderness.  Musculoskeletal: She exhibits no edema or tenderness.  Lymphadenopathy:    She has no cervical adenopathy.  Skin: No rash noted. No erythema.  Psychiatric: She has a normal mood and affect. Her behavior is normal.    BP 118/68 (BP Location: Left Arm, Patient Position: Sitting, Cuff Size: Large)   Pulse (!) 59   Temp 97.7 F (36.5 C) (Oral)   Resp 16   Wt 262 lb 2 oz (118.9 kg)   LMP 02/01/1993   SpO2 98%   BMI 43.67 kg/m  Wt Readings from Last 3 Encounters:  02/15/17 262 lb (118.8 kg)  02/15/17 262 lb 2 oz (118.9 kg)  10/12/16 261 lb 9.6 oz (118.7 kg)     Lab Results  Component Value Date   WBC 4.6 10/23/2016   HGB 13.4 10/23/2016   HCT 41.7 10/23/2016   PLT 227.0 10/23/2016   GLUCOSE 98 02/12/2017   CHOL 144 02/12/2017   TRIG 60.0 02/12/2017   HDL 65.60 02/12/2017   LDLDIRECT 129.8 02/15/2012   LDLCALC 66 02/12/2017   ALT 8 02/12/2017   AST 14 02/12/2017   NA 140 02/12/2017   K 5.0 02/12/2017   CL 103 02/12/2017   CREATININE 0.77 02/12/2017   BUN 23 02/12/2017   CO2 32 02/12/2017   TSH 1.84 02/12/2017    Mm Screening Breast Tomo Bilateral  Result Date: 03/09/2016 CLINICAL DATA:  Screening. EXAM: 2D DIGITAL SCREENING BILATERAL MAMMOGRAM WITH CAD AND ADJUNCT TOMO COMPARISON:  Previous exam(s). ACR Breast Density Category b: There are scattered areas of fibroglandular density. FINDINGS: There are no findings suspicious for malignancy. Images were processed with CAD. IMPRESSION: No  mammographic evidence of malignancy. A result letter of this screening mammogram will be mailed directly to the patient. RECOMMENDATION: Screening mammogram in one year. (Code:SM-B-01Y) BI-RADS CATEGORY  1: Negative. Electronically Signed   By: Dalphine HandingErin  Shaw M.D.   On: 03/09/2016 14:28       Assessment & Plan:   Problem List Items Addressed This Visit    BMI 40.0-44.9, adult (HCC)    Discussed diet and exercise.  Follow.       GERD (gastroesophageal reflux disease)    Controlled on current regimen.  Follow.       Hypercholesterolemia    Low cholesterol diet and exercise.  Follow lipid panel.        Relevant Medications  rosuvastatin (CRESTOR) 5 MG tablet   Other Relevant Orders   Lipid panel   Hepatic function panel   Basic metabolic panel   Hypothyroidism    On thyroid replacement.  Follow tsh.       Relevant Medications   levothyroxine (SYNTHROID) 137 MCG tablet   Vitamin D deficiency    Follow vitamin d level.        Other Visit Diagnoses    Arm skin lesion, right    -  Primary   Persistent.  discussed dermatology referral.  wants to hold.  will notify me if changes her mind.         Dale , MD

## 2017-02-15 NOTE — Progress Notes (Signed)
Subjective:   Jodi Harris is a 77 y.o. female who presents for Medicare Annual (Subsequent) preventive examination.  Review of Systems:  No ROS.  Medicare Wellness Visit. Additional risk factors are reflected in the social history.  Cardiac Risk Factors include: advanced age (>6155men, 55>65 women);hypertension     Objective:     Vitals: BP 118/68 (BP Location: Left Arm, Patient Position: Sitting, Cuff Size: Normal)   Pulse (!) 59   Temp 97.7 F (36.5 C) (Oral)   Resp 16   Ht 5\' 4"  (1.626 m)   Wt 262 lb (118.8 kg)   LMP 02/01/1993   SpO2 98%   BMI 44.97 kg/m   Body mass index is 44.97 kg/m.  Advanced Directives 02/15/2017 02/14/2016 01/29/2015  Does Patient Have a Medical Advance Directive? Yes Yes Yes  Type of Estate agentAdvance Directive Healthcare Power of [place];Living will Healthcare Power of [place];Living will Healthcare Power of [place];Living will  Does patient want to make changes to medical advance directive? No - Patient declined No - Patient declined No - Patient declined  Copy of Healthcare Power of Attorney in Chart? No - copy requested No - copy requested No - copy requested    Tobacco Social History   Tobacco Use  Smoking Status Former Smoker  Smokeless Tobacco Never Used  Tobacco Comment   smoked as a teenager     Counseling given: Not Answered Comment: smoked as a teenager   Clinical Intake:  Pre-visit preparation completed: Yes  Pain : No/denies pain     Nutritional Status: BMI > 30  Obese Diabetes: No  How often do you need to have someone help you when you read instructions, pamphlets, or other written materials from your doctor or pharmacy?: 1 - Never  Interpreter Needed?: No     Past Medical History:  Diagnosis Date  . Anemia   . GERD (gastroesophageal reflux disease)   . Hypercholesterolemia   . Hypothyroidism   . Thrombophlebitis    varicosities   Past Surgical History:  Procedure Laterality Date  . COLON SURGERY     laparoscopic assisted transverse colectomy for tubular adenomatous polyp  . LAPAROSCOPIC GASTRIC BANDING    . URETHRAL DIVERTICULUM REPAIR  1974   Dr [name]   Family History  Problem Relation Age of Onset  . Cancer Father        lymphoma  . Cancer Maternal Aunt        Breast  . Breast cancer Maternal Aunt 50  . Colon cancer Unknown        one relative on maternal side   Social History   Socioeconomic History  . Marital status: Married    Spouse name: None  . Number of children: 2  . Years of education: None  . Highest education level: None  Social Needs  . Financial resource strain: None  . Food insecurity - worry: None  . Food insecurity - inability: None  . Transportation needs - medical: None  . Transportation needs - non-medical: None  Occupational History  . None  Tobacco Use  . Smoking status: Former Games developermoker  . Smokeless tobacco: Never Used  . Tobacco comment: smoked as a teenager  Substance and Sexual Activity  . Alcohol use: Yes    Alcohol/week: 0.0 oz    Comment: has a glass of wine about once a month  . Drug use: No  . Sexual activity: No  Other Topics Concern  . None  Social History Narrative  . None  Outpatient Encounter Medications as of 02/15/2017  Medication Sig  . calcium carbonate (TUMS EX) 750 MG chewable tablet Chew 1 tablet by mouth as needed.   . Cholecalciferol (VITAMIN D-3 PO) Take 2,000 Units by mouth daily.  Marland Kitchen esomeprazole (NEXIUM) 20 MG capsule Take 20 mg by mouth daily at 12 noon.  . fluticasone (FLONASE) 50 MCG/ACT nasal spray Place 2 sprays into both nostrils daily as needed.  Marland Kitchen levothyroxine (SYNTHROID) 137 MCG tablet Take 1 tablet (137 mcg total) by mouth daily before breakfast.  . mupirocin ointment (BACTROBAN) 2 % Apply to affected area bid.  . Naproxen Sodium (ALEVE PO) Take by mouth as needed.   . Probiotic Product (ALIGN PO) Take 1 capsule by mouth daily.  . rosuvastatin (CRESTOR) 5 MG tablet Take 1 tablet (5 mg total) by  mouth daily.   No facility-administered encounter medications on file as of 02/15/2017.     Activities of Daily Living In your present state of health, do you have any difficulty performing the following activities: 02/15/2017  Hearing? N  Vision? N  Difficulty concentrating or making decisions? N  Walking or climbing stairs? N  Dressing or bathing? N  Doing errands, shopping? N  Preparing Food and eating ? N  Using the Toilet? N  In the past six months, have you accidently leaked urine? N  Do you have problems with loss of bowel control? N  Managing your Medications? N  Managing your Finances? N  Housekeeping or managing your Housekeeping? N  Some recent data might be hidden    Patient Care Team: Dale Graball, MD as PCP - General (Internal Medicine)    Assessment:   This is a routine wellness examination for Jodi Harris.  The goal of the wellness visit is to assist the patient how to close the gaps in care and create a preventative care plan for the patient.   The roster of all physicians providing medical care to patient is listed in the Snapshot section of the chart.  Taking calcium VIT D as appropriate/Osteoporosis risk reviewed.    Safety issues reviewed; Smoke and carbon monoxide detectors in the home. No firearms or firearms locked in a safe within the home. Wears seatbelts when driving or riding with others. No violence in the home.  They do not have excessive sun exposure.  Discussed the need for sun protection: hats, long sleeves and the use of sunscreen if there is significant sun exposure.  Depression- PHQ 2 &9 complete.  There are no signs/symptoms or verbal communication regarding depression, irritability, anhedonia, sadness/tearfullness. See depression screening.  Not currently undergoing counseling.  No previous treatment for depression.    Patient is alert, normal appearance, oriented to person/place/and time.  Correctly identified the president of the [place] and  recalls of 3/3 words. Performs simple calculations and can read correct time from watch face.  Displays appropriate judgement.  No new identified risk were noted.  No failures at ADL's or IADL's.    BMI- discussed the importance of a healthy diet, water intake and the benefits of aerobic exercise. Educational material provided.   24 hour diet recall: Regular diet.  Low cholesterol diet.   Daily fluid intake: 4 cups of caffeine, 4-6 cups of water.  Dental- every 6 months.  Eye- Visual acuity not assessed per patient preference since they have regular follow up with the ophthalmologist.   Sleep patterns- Sleeps 6-7 hours at night.    Health maintenance gaps- closed.  Patient Concerns: None at this  time. Follow up with PCP as needed.  Exercise Activities and Dietary recommendations Current Exercise Habits: The patient does not participate in regular exercise at present  Goals    . DIET - INCREASE LEAN PROTEINS     Low carb foods    . Increase physical activity     Walk for exercise       Fall Risk Fall Risk  02/15/2017 02/14/2016 12/14/2015 11/03/2015 01/29/2015  Falls in the past year? No No No No No  Number falls in past yr: - - - - -  Injury with Fall? - - - - -  Risk for fall due to : - - - - -   Depression Screen PHQ 2/9 Scores 02/15/2017 02/14/2016 12/14/2015 11/03/2015  PHQ - 2 Score 0 0 0 0     Cognitive Function MMSE - Mini Mental State Exam 02/15/2017 01/29/2015  Orientation to time 5 5  Orientation to Place 5 5  Registration 3 3  Attention/ Calculation 5 5  Recall 3 3  Language- name 2 objects 2 2  Language- repeat 1 1  Language- follow 3 step command 3 3  Language- read & follow direction 1 1  Write a sentence 1 1  Copy design 1 1  Total score 30 30     6CIT Screen 02/14/2016  What Year? 0 points  What month? 0 points  What time? 0 points  Count back from 20 0 points  Months in reverse 0 points  Repeat phrase 0 points  Total Score 0     Immunization History  Administered Date(s) Administered  . Influenza Split 12/03/2011, 11/12/2012, 11/13/2013  . Influenza,inj,quad, With Preservative 11/17/2016  . Influenza-Unspecified 11/14/2014, 11/04/2015  . Pneumococcal Conjugate-13 04/24/2013  . Pneumococcal Polysaccharide-23 02/02/2012  . Tdap 03/10/2010  . Zoster 07/01/2008   Screening Tests Health Maintenance  Topic Date Due  . MAMMOGRAM  03/09/2017  . COLONOSCOPY  03/31/2019  . TETANUS/TDAP  03/09/2020  . INFLUENZA VACCINE  Completed  . DEXA SCAN  Completed  . PNA vac Low Risk Adult  Completed       Plan:    End of life planning; Advance aging; Advanced directives discussed. Copy of current HCPOA/Living Will requested.    I have personally reviewed and noted the following in the patient's chart:   . Medical and social history . Use of alcohol, tobacco or illicit drugs  . Current medications and supplements . Functional ability and status . Nutritional status . Physical activity . Advanced directives . List of other physicians . Hospitalizations, surgeries, and ER visits in previous 12 months . Vitals . Screenings to include cognitive, depression, and falls . Referrals and appointments  In addition, I have reviewed and discussed with patient certain preventive protocols, quality metrics, and best practice recommendations. A written personalized care plan for preventive services as well as general preventive health recommendations were provided to patient.     Ashok Pall, LPN  01/14/1094   Reviewed above information.  Agree with assessment and plan.    Dr Lorin Picket

## 2017-02-18 ENCOUNTER — Encounter: Payer: Self-pay | Admitting: Internal Medicine

## 2017-02-18 NOTE — Assessment & Plan Note (Signed)
Controlled on current regimen.  Follow.  

## 2017-02-18 NOTE — Assessment & Plan Note (Signed)
On thyroid replacement.  Follow tsh.  

## 2017-02-18 NOTE — Assessment & Plan Note (Signed)
Follow vitamin d level.   

## 2017-02-18 NOTE — Assessment & Plan Note (Signed)
Low cholesterol diet and exercise.  Follow lipid panel.   

## 2017-02-18 NOTE — Assessment & Plan Note (Signed)
Discussed diet and exercise.  Follow.  

## 2017-04-12 ENCOUNTER — Ambulatory Visit
Admission: RE | Admit: 2017-04-12 | Discharge: 2017-04-12 | Disposition: A | Discharge status: Home / Self Care | Payer: Medicare Other | Source: Ambulatory Visit | Attending: Internal Medicine | Admitting: Internal Medicine

## 2017-04-12 DIAGNOSIS — R928 Other abnormal and inconclusive findings on diagnostic imaging of breast: Secondary | ICD-10-CM | POA: Insufficient documentation

## 2017-04-12 DIAGNOSIS — Z1231 Encounter for screening mammogram for malignant neoplasm of breast: Secondary | ICD-10-CM | POA: Insufficient documentation

## 2017-04-16 ENCOUNTER — Telehealth: Payer: Self-pay

## 2017-04-16 ENCOUNTER — Telehealth: Payer: Self-pay | Admitting: *Deleted

## 2017-04-16 ENCOUNTER — Other Ambulatory Visit: Payer: Self-pay | Admitting: Internal Medicine

## 2017-04-16 DIAGNOSIS — R928 Other abnormal and inconclusive findings on diagnostic imaging of breast: Secondary | ICD-10-CM

## 2017-04-16 NOTE — Telephone Encounter (Signed)
Patient aware of results.

## 2017-04-16 NOTE — Telephone Encounter (Signed)
Called and spoke with patient see result note.

## 2017-04-16 NOTE — Telephone Encounter (Signed)
Copied from CRM [phone]. Topic: Quick Communication - Other Results >> Apr 16, 2017 11:44 AM Dennie Bibleavis, Kathy R, LPN wrote: Called patient to inform them of 04/13/17 results. When patient returns call, triage nurse may not disclose results.  >> Apr 16, 2017 12:06 PM Windy KalataMichael, Taylor L, NT wrote: [facility].  >> Apr 16, 2017 12:08 PM Windy KalataMichael, Taylor L, NT wrote: She states she did have anything done on 04/13/17. Please advise

## 2017-04-16 NOTE — Telephone Encounter (Signed)
Copied from CRM #[id number]. Topic: Quick Communication - Other Results >> Apr 16, 2017 11:44 AM Davis, Kathy R, LPN wrote: Called patient to inform them of 04/13/17 results. When patient returns call, triage nurse may not disclose results.  >> Apr 16, 2017 12:06 PM Michael, Taylor L, NT wrote: Patient is returning Kathy phone call.  >> Apr 16, 2017 12:08 PM Michael, Taylor L, NT wrote: She states she did have anything done on 04/13/17. Please advise 

## 2017-04-16 NOTE — Progress Notes (Signed)
Order placed for f/u right breast mammogram and ultrasoud.

## 2017-05-03 ENCOUNTER — Ambulatory Visit
Admission: RE | Admit: 2017-05-03 | Discharge: 2017-05-03 | Disposition: A | Discharge status: Home / Self Care | Payer: Medicare Other | Source: Ambulatory Visit | Attending: Internal Medicine | Admitting: Internal Medicine

## 2017-05-03 DIAGNOSIS — R928 Other abnormal and inconclusive findings on diagnostic imaging of breast: Secondary | ICD-10-CM

## 2017-06-14 ENCOUNTER — Other Ambulatory Visit (INDEPENDENT_AMBULATORY_CARE_PROVIDER_SITE_OTHER): Payer: Medicare Other

## 2017-06-14 DIAGNOSIS — E7800 Pure hypercholesterolemia, unspecified: Secondary | ICD-10-CM

## 2017-06-14 LAB — BASIC METABOLIC PANEL
BUN: 18 mg/dL (ref 6–23)
CO2: 34 meq/L — AB (ref 19–32)
CREATININE: 0.69 mg/dL (ref 0.40–1.20)
Calcium: 9.2 mg/dL (ref 8.4–10.5)
Chloride: 104 mEq/L (ref 96–112)
GFR: 87.78 mL/min (ref >60.00)
GLUCOSE: 90 mg/dL (ref 70–99)
Potassium: 4.9 mEq/L (ref 3.5–5.1)
Sodium: 140 mEq/L (ref 135–145)

## 2017-06-14 LAB — HEPATIC FUNCTION PANEL
ALBUMIN: 3.6 g/dL (ref 3.5–5.2)
ALT: 9 U/L (ref 0–35)
AST: 13 U/L (ref 0–37)
Alkaline Phosphatase: 59 U/L (ref 39–117)
Bilirubin, Direct: 0.1 mg/dL (ref 0.0–0.3)
TOTAL PROTEIN: 6.2 g/dL (ref 6.0–8.3)
Total Bilirubin: 0.5 mg/dL (ref 0.2–1.2)

## 2017-06-14 LAB — LIPID PANEL
CHOL/HDL RATIO: 3
CHOLESTEROL: 137 mg/dL (ref 0–200)
HDL: 49.9 mg/dL (ref >39.00)
LDL Cholesterol: 76 mg/dL (ref 0–99)
NonHDL: 87.38
TRIGLYCERIDES: 58 mg/dL (ref 0.0–149.0)
VLDL: 11.6 mg/dL (ref 0.0–40.0)

## 2017-06-18 ENCOUNTER — Other Ambulatory Visit: Payer: Medicare Other

## 2017-06-21 ENCOUNTER — Encounter: Payer: Self-pay | Admitting: Internal Medicine

## 2017-06-21 ENCOUNTER — Ambulatory Visit: Payer: Medicare Other | Admitting: Internal Medicine

## 2017-06-21 DIAGNOSIS — Z6841 Body Mass Index (BMI) 40.0 and over, adult: Secondary | ICD-10-CM | POA: Diagnosis not present

## 2017-06-21 DIAGNOSIS — E559 Vitamin D deficiency, unspecified: Secondary | ICD-10-CM | POA: Diagnosis not present

## 2017-06-21 DIAGNOSIS — M79605 Pain in left leg: Secondary | ICD-10-CM | POA: Diagnosis not present

## 2017-06-21 DIAGNOSIS — E7800 Pure hypercholesterolemia, unspecified: Secondary | ICD-10-CM

## 2017-06-21 DIAGNOSIS — F439 Reaction to severe stress, unspecified: Secondary | ICD-10-CM | POA: Diagnosis not present

## 2017-06-21 DIAGNOSIS — E039 Hypothyroidism, unspecified: Secondary | ICD-10-CM | POA: Diagnosis not present

## 2017-06-21 DIAGNOSIS — K219 Gastro-esophageal reflux disease without esophagitis: Secondary | ICD-10-CM | POA: Diagnosis not present

## 2017-06-21 NOTE — Progress Notes (Signed)
Patient ID: Jodi MaisSara Compton Czarnecki, female   DOB: 09/02/1940, 77 y.o.   MRN: [id number]   Subjective:    Patient ID: Jodi MaisSara Compton Friddle, female    DOB: 12/11/1940, 77 y.o.   MRN: [id number]  HPI  Patient here for a scheduled follow up.  She reports she is doing relatively well.  She has noticed some increased left leg discomfort and knee stiffness.  Has worsened - since Christmas.  If stands for a long time, aggravates.  Also notices some stiffness if sitting for a while and then stands.  If better with walking.  No chest pain.  Breathing overall stable.  Discussed diet and exercise.  Discussed weight loss.  Persistent right arm lesion.  She will call and schedule f/u with dermatology.  No abdominal pain.  Bowels stable.     Past Medical History:  Diagnosis Date  . Anemia   . GERD (gastroesophageal reflux disease)   . Hypercholesterolemia   . Hypothyroidism   . Thrombophlebitis    varicosities   Past Surgical History:  Procedure Laterality Date  . COLON SURGERY     laparoscopic assisted transverse colectomy for tubular adenomatous polyp  . LAPAROSCOPIC GASTRIC BANDING    . URETHRAL DIVERTICULUM REPAIR  1974   Dr [name]   Family History  Problem Relation Age of Onset  . Cancer Father        lymphoma  . Cancer Maternal Aunt        Breast  . Breast cancer Maternal Aunt 50  . Colon cancer Unknown        one relative on maternal side   Social History   Socioeconomic History  . Marital status: Married    Spouse name: Not on file  . Number of children: 2  . Years of education: Not on file  . Highest education level: Not on file  Occupational History  . Not on file  Social Needs  . Financial resource strain: Not on file  . Food insecurity:    Worry: Not on file    Inability: Not on file  . Transportation needs:    Medical: Not on file    Non-medical: Not on file  Tobacco Use  . Smoking status: Former Games developermoker  . Smokeless tobacco: Never Used  . Tobacco comment: smoked as a  teenager  Substance and Sexual Activity  . Alcohol use: Yes    Alcohol/week: 0.0 oz    Comment: has a glass of wine about once a month  . Drug use: No  . Sexual activity: Never  Lifestyle  . Physical activity:    Days per week: Not on file    Minutes per session: Not on file  . Stress: Not on file  Relationships  . Social connections:    Talks on phone: Not on file    Gets together: Not on file    Attends religious service: Not on file    Active member of club or organization: Not on file    Attends meetings of clubs or organizations: Not on file    Relationship status: Not on file  Other Topics Concern  . Not on file  Social History Narrative  . Not on file    Outpatient Encounter Medications as of 06/21/2017  Medication Sig  . calcium carbonate (TUMS EX) 750 MG chewable tablet Chew 1 tablet by mouth as needed.   . Cholecalciferol (VITAMIN D-3 PO) Take 2,000 Units by mouth daily.  Marland Kitchen. esomeprazole (NEXIUM) 20 MG capsule Take  20 mg by mouth daily at 12 noon.  . fluticasone (FLONASE) 50 MCG/ACT nasal spray Place 2 sprays into both nostrils daily as needed.  Marland Kitchen levothyroxine (SYNTHROID) 137 MCG tablet Take 1 tablet (137 mcg total) by mouth daily before breakfast.  . mupirocin ointment (BACTROBAN) 2 % Apply to affected area bid.  . Naproxen Sodium (ALEVE PO) Take by mouth as needed.   . Probiotic Product (ALIGN PO) Take 1 capsule by mouth daily.  . rosuvastatin (CRESTOR) 5 MG tablet Take 1 tablet (5 mg total) by mouth daily.   No facility-administered encounter medications on file as of 06/21/2017.     Review of Systems  Constitutional: Negative for appetite change and unexpected weight change.  HENT: Negative for congestion and sinus pressure.   Respiratory: Negative for cough, chest tightness and shortness of breath.   Cardiovascular: Negative for chest pain, palpitations and leg swelling.  Gastrointestinal: Negative for abdominal pain, diarrhea, nausea and vomiting.    Musculoskeletal: Negative for myalgias.       Leg discomfort and knee stiffness.    Skin: Negative for color change and rash.  Neurological: Negative for dizziness, light-headedness and headaches.  Psychiatric/Behavioral: Negative for agitation and dysphoric mood.       Objective:    Physical Exam  Constitutional: She appears well-developed and well-nourished. No distress.  HENT:  Nose: Nose normal.  Mouth/Throat: Oropharynx is clear and moist.  Neck: Neck supple. No thyromegaly present.  Cardiovascular: Normal rate and regular rhythm.  Pulmonary/Chest: Breath sounds normal. No respiratory distress. She has no wheezes.  Abdominal: Soft. Bowel sounds are normal. There is no tenderness.  Musculoskeletal: She exhibits no edema or tenderness.  Some increased discomfort with attempts at full flexion - left leg.  Negative SLR.    Lymphadenopathy:    She has no cervical adenopathy.  Skin: No rash noted. No erythema.  Psychiatric: She has a normal mood and affect. Her behavior is normal.    BP 120/62 (BP Location: Left Arm, Patient Position: Sitting, Cuff Size: Large)   Pulse 62   Temp 98.1 F (36.7 C) (Oral)   Resp 18   Wt 265 lb 12.8 oz (120.6 kg)   LMP 02/01/1993   SpO2 97%   BMI 45.62 kg/m  Wt Readings from Last 3 Encounters:  06/21/17 265 lb 12.8 oz (120.6 kg)  02/15/17 262 lb (118.8 kg)  02/15/17 262 lb 2 oz (118.9 kg)     Lab Results  Component Value Date   WBC 4.6 10/23/2016   HGB 13.4 10/23/2016   HCT 41.7 10/23/2016   PLT 227.0 10/23/2016   GLUCOSE 90 06/14/2017   CHOL 137 06/14/2017   TRIG 58.0 06/14/2017   HDL 49.90 06/14/2017   LDLDIRECT 129.8 02/15/2012   LDLCALC 76 06/14/2017   ALT 9 06/14/2017   AST 13 06/14/2017   NA 140 06/14/2017   K 4.9 06/14/2017   CL 104 06/14/2017   CREATININE 0.69 06/14/2017   BUN 18 06/14/2017   CO2 34 (H) 06/14/2017   TSH 1.84 02/12/2017    Mm Diag Breast Tomo Uni Right  Addendum Date: 05/04/2017   ADDENDUM  REPORT: 05/04/2017 13:42 ADDENDUM: Today's diagnostic mammogram was actually on the right for a right-sided asymmetry. Electronically Signed   By: Gerome Sam III M.D   On: 05/04/2017 13:42   Result Date: 05/04/2017 CLINICAL DATA:  The patient was called back for a left breast asymmetry. EXAM: DIGITAL DIAGNOSTIC UNILATERAL RIGHT MAMMOGRAM WITH CAD AND TOMO COMPARISON:  Previous exam(s). ACR Breast Density Category b: There are scattered areas of fibroglandular density. FINDINGS: The left breast asymmetry resolves on additional imaging. Mammographic images were processed with CAD. IMPRESSION: No mammographic evidence of malignancy. RECOMMENDATION: Annual screening mammography. I have discussed the findings and recommendations with the patient. Results were also provided in writing at the conclusion of the visit. If applicable, a reminder letter will be sent to the patient regarding the next appointment. BI-RADS CATEGORY  2: Benign. Electronically Signed: By: Gerome Sam III M.D On: 05/03/2017 11:00       Assessment & Plan:   Problem List Items Addressed This Visit    BMI 45.0-49.9, adult (HCC)    Discussed diet and exercise.  Follow.        GERD (gastroesophageal reflux disease)    Controlled on nexium.  Follow.        Hypercholesterolemia    Low cholesterol diet and exercise.  On crestor.  Follow lipid panel and liver function tests.        Relevant Orders   CBC with Differential/Platelet   Lipid panel   Hepatic function panel   Basic metabolic panel   Hypothyroidism    On thyroid replacement.  Follow tsh.       Relevant Orders   TSH   Left leg pain    Left leg pain and knee stiffness.  Persistent pain and problems.  Limiting her activity.  Discussed further evaluation.  Agrees to ortho referral.  Request Dr Elenor Legato office.  Will hold on vascular surgery referral.  Discussed leg elevation and compression hose.  Follow.        Relevant Orders   Ambulatory referral to  Orthopedic Surgery   Stress    Overall handling things relatively well.  Follow.        Vitamin D deficiency    Follow vitamin D level.            Dale [place], MD

## 2017-06-24 ENCOUNTER — Encounter: Payer: Self-pay | Admitting: Internal Medicine

## 2017-06-24 DIAGNOSIS — M79605 Pain in left leg: Secondary | ICD-10-CM | POA: Insufficient documentation

## 2017-06-24 NOTE — Assessment & Plan Note (Signed)
Follow vitamin D level.  

## 2017-06-24 NOTE — Assessment & Plan Note (Signed)
Low cholesterol diet and exercise.  On crestor.  Follow lipid panel and liver function tests.  

## 2017-06-24 NOTE — Assessment & Plan Note (Signed)
Left leg pain and knee stiffness.  Persistent pain and problems.  Limiting her activity.  Discussed further evaluation.  Agrees to ortho referral.  Request Dr Elenor LegatoHooten's office.  Will hold on vascular surgery referral.  Discussed leg elevation and compression hose.  Follow.

## 2017-06-24 NOTE — Assessment & Plan Note (Signed)
Controlled on nexium.  Follow.  

## 2017-06-24 NOTE — Assessment & Plan Note (Signed)
On thyroid replacement.  Follow tsh.  

## 2017-06-24 NOTE — Assessment & Plan Note (Signed)
Discussed diet and exercise.  Follow.  

## 2017-06-24 NOTE — Assessment & Plan Note (Signed)
Overall handling things relatively well.  Follow.  

## 2017-07-30 IMAGING — MG MM SCREENING BREAST TOMO BILATERAL
8 of 15 series · 8 of 35 positions shown · non-contrast
Comparison: Previous exam(s).

CLINICAL DATA: Screening.

EXAM:
DIGITAL SCREENING BILATERAL MAMMOGRAM WITH 3D TOMO WITH CAD

[L MLO (1 of 2)]
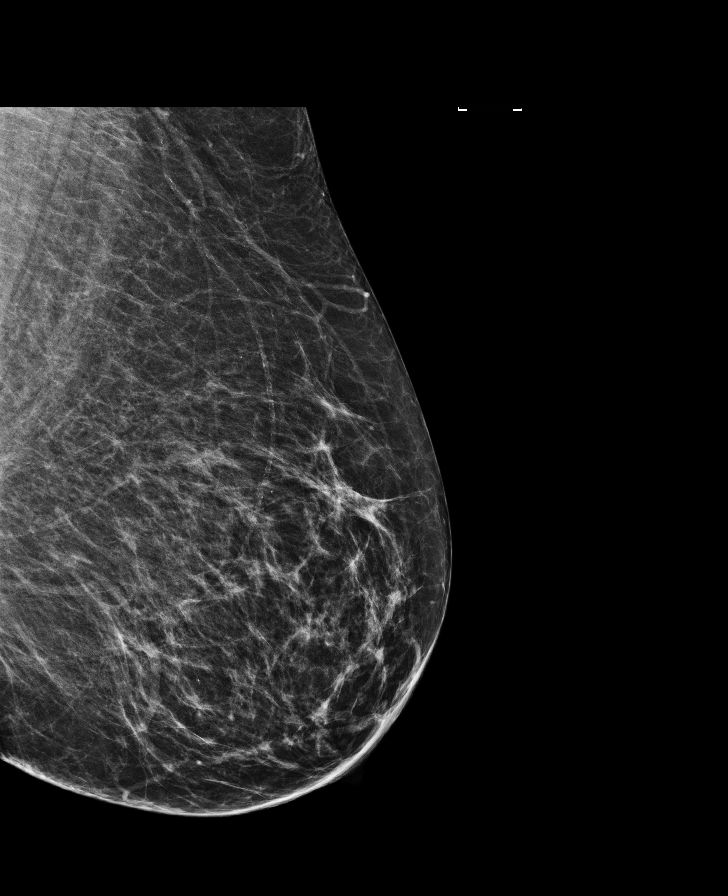

[L MLO synth-2D (1 of 2)]
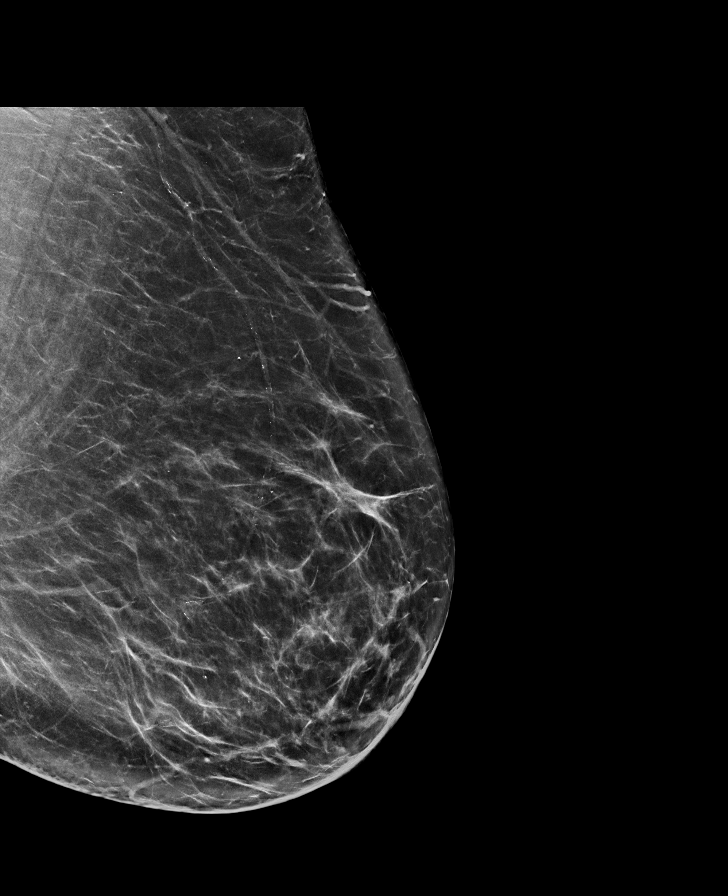

[L MLO (2 of 2)]
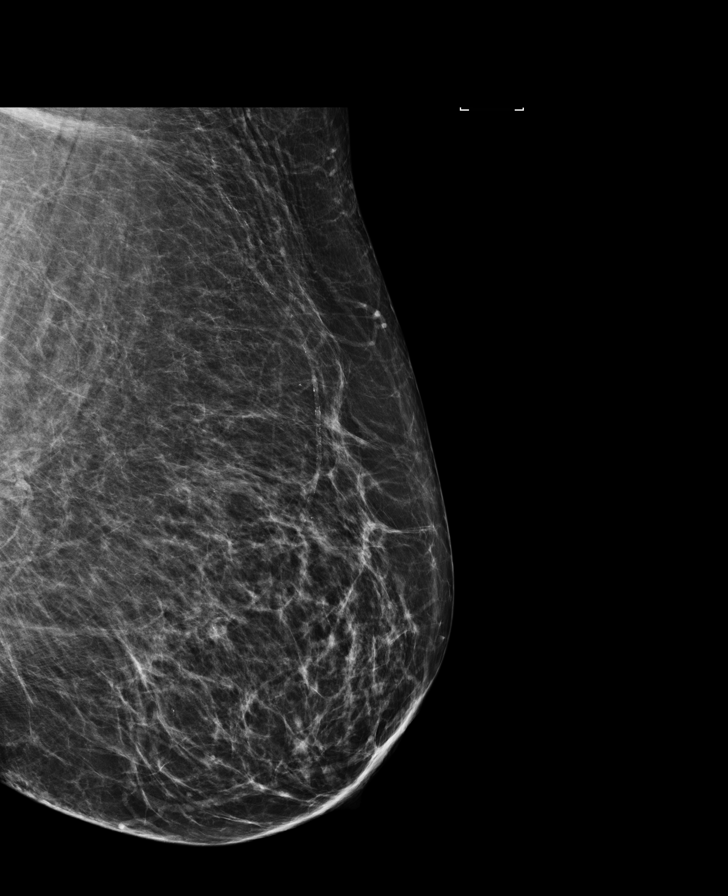

[R CC]
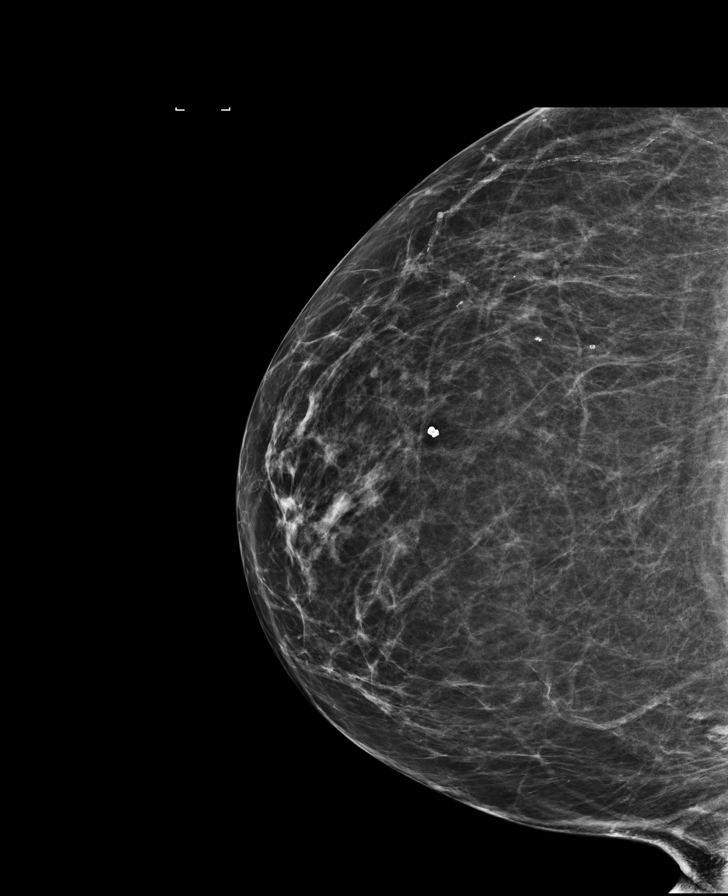

[L CC synth-2D]
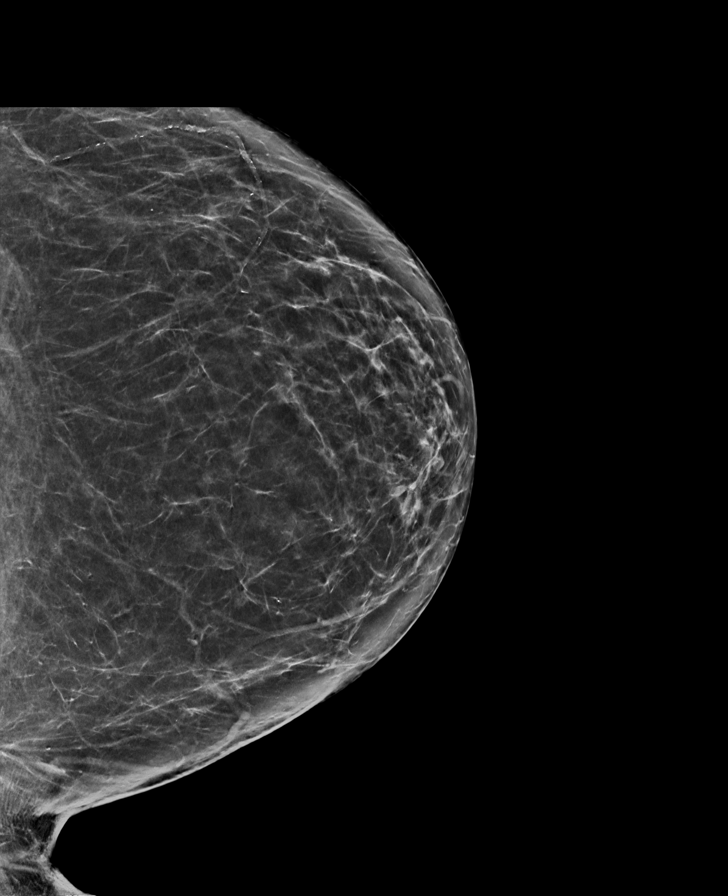

[R CC synth-2D]
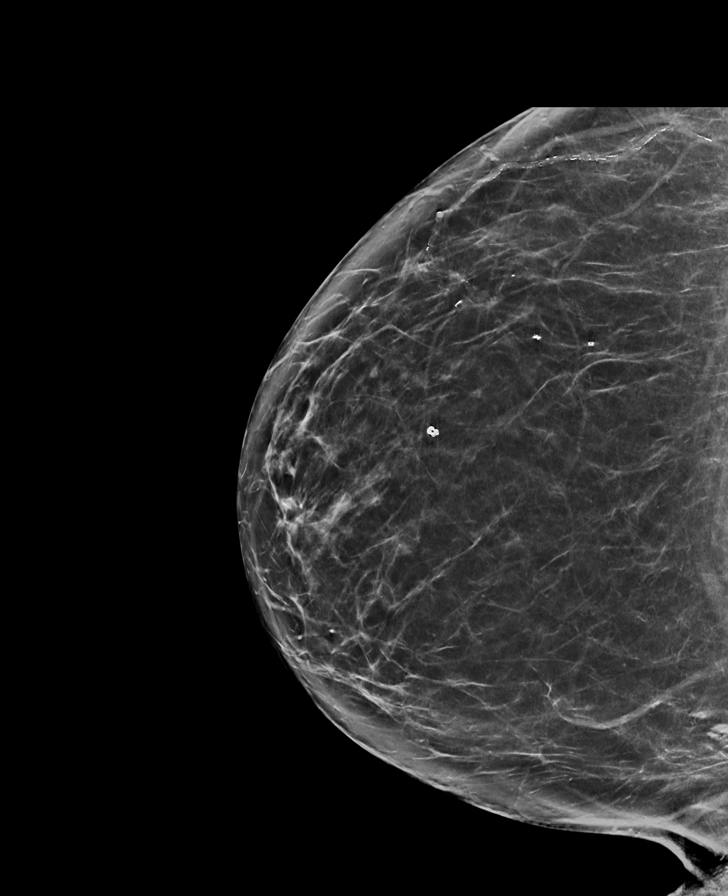

[R MLO]
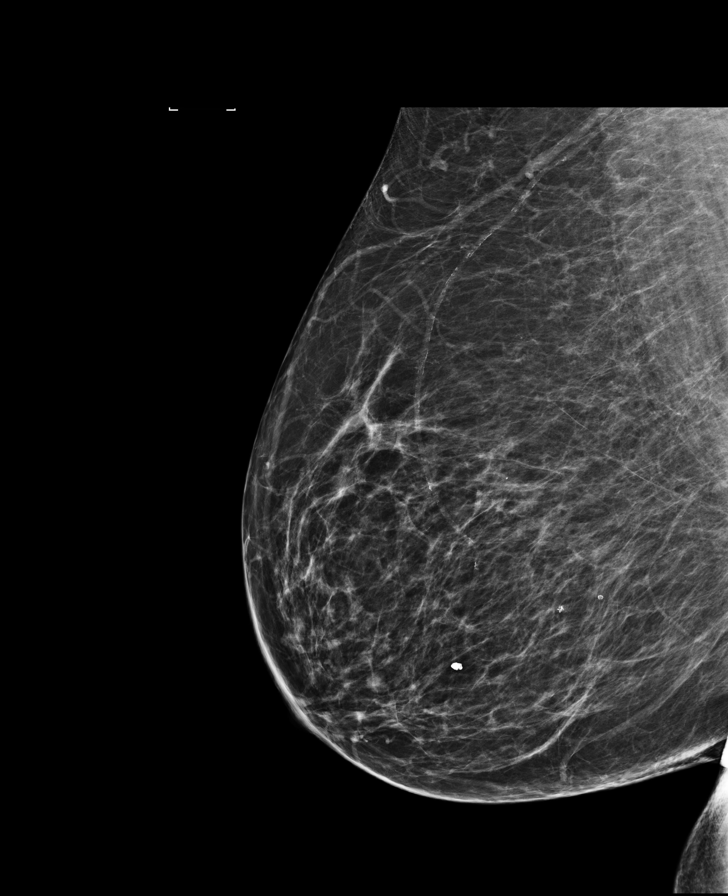

[L MLO synth-2D (2 of 2)]
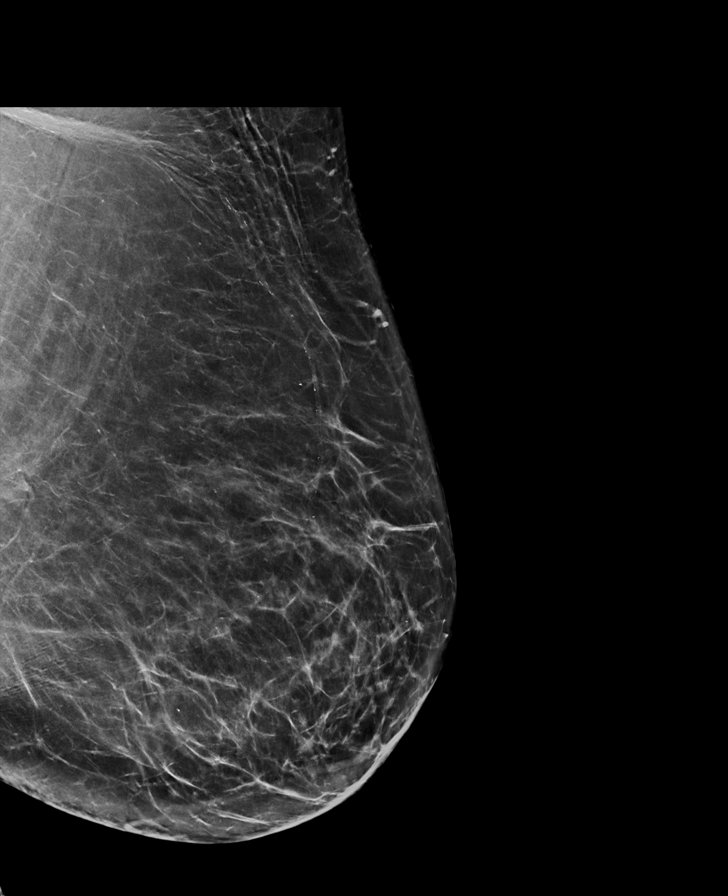

[8 of 35 positions shown; findings below may reference images not displayed]

ACR Breast Density Category b: There are scattered areas of
fibroglandular density.
FINDINGS: There are no findings suspicious for malignancy. Images were
processed with CAD.
IMPRESSION: No mammographic evidence of malignancy. A result letter of this
screening mammogram will be mailed directly to the patient.

RECOMMENDATION:
Screening mammogram in one year. (Code:55-L-[id number])

BI-RADS CATEGORY  1: Negative.

## 2017-10-11 ENCOUNTER — Telehealth: Payer: Self-pay | Admitting: Internal Medicine

## 2017-10-11 NOTE — Telephone Encounter (Signed)
Copied from CRM [id number]. Topic: Quick Communication - See Telephone Encounter >> Oct 11, 2017  2:28 PM Jens Som A wrote: CRM for notification. See Telephone encounter for: 10/11/17. Patient is calling to speak to [place].  The office did not answer when [facility] called. Patient call back number is [phone]

## 2017-10-11 NOTE — Telephone Encounter (Signed)
Return patient call

## 2017-10-11 NOTE — Telephone Encounter (Signed)
Pt called

## 2017-10-18 ENCOUNTER — Other Ambulatory Visit (INDEPENDENT_AMBULATORY_CARE_PROVIDER_SITE_OTHER): Payer: Medicare Other

## 2017-10-18 DIAGNOSIS — E039 Hypothyroidism, unspecified: Secondary | ICD-10-CM | POA: Diagnosis not present

## 2017-10-18 DIAGNOSIS — E7800 Pure hypercholesterolemia, unspecified: Secondary | ICD-10-CM | POA: Diagnosis not present

## 2017-10-18 LAB — CBC WITH DIFFERENTIAL/PLATELET
BASOS PCT: 1 % (ref 0.0–3.0)
Basophils Absolute: 0 10*3/uL (ref 0.0–0.1)
EOS PCT: 3.1 % (ref 0.0–5.0)
Eosinophils Absolute: 0.1 10*3/uL (ref 0.0–0.7)
HEMATOCRIT: 40.7 % (ref 36.0–46.0)
HEMOGLOBIN: 13.2 g/dL (ref 12.0–15.0)
LYMPHS PCT: 24 % (ref 12.0–46.0)
Lymphs Abs: 1.1 10*3/uL (ref 0.7–4.0)
MCHC: 32.5 g/dL (ref 30.0–36.0)
MCV: 89.6 fl (ref 78.0–100.0)
Monocytes Absolute: 0.4 10*3/uL (ref 0.1–1.0)
Monocytes Relative: 8.6 % (ref 3.0–12.0)
Neutro Abs: 3 10*3/uL (ref 1.4–7.7)
Neutrophils Relative %: 63.3 % (ref 43.0–77.0)
Platelets: 206 10*3/uL (ref 150.0–400.0)
RBC: 4.54 Mil/uL (ref 3.87–5.11)
RDW: 14.2 % (ref 11.5–15.5)
WBC: 4.8 10*3/uL (ref 4.0–10.5)

## 2017-10-18 LAB — LIPID PANEL
CHOLESTEROL: 132 mg/dL (ref 0–200)
HDL: 50.9 mg/dL (ref >39.00)
LDL Cholesterol: 68 mg/dL (ref 0–99)
NonHDL: 80.83
TRIGLYCERIDES: 63 mg/dL (ref 0.0–149.0)
Total CHOL/HDL Ratio: 3
VLDL: 12.6 mg/dL (ref 0.0–40.0)

## 2017-10-18 LAB — BASIC METABOLIC PANEL
BUN: 22 mg/dL (ref 6–23)
CALCIUM: 9.3 mg/dL (ref 8.4–10.5)
CHLORIDE: 103 meq/L (ref 96–112)
CO2: 32 mEq/L (ref 19–32)
CREATININE: 0.85 mg/dL (ref 0.40–1.20)
GFR: 68.94 mL/min (ref >60.00)
Glucose, Bld: 87 mg/dL (ref 70–99)
Potassium: 4.8 mEq/L (ref 3.5–5.1)
Sodium: 140 mEq/L (ref 135–145)

## 2017-10-18 LAB — HEPATIC FUNCTION PANEL
ALT: 9 U/L (ref 0–35)
AST: 15 U/L (ref 0–37)
Albumin: 3.8 g/dL (ref 3.5–5.2)
Alkaline Phosphatase: 59 U/L (ref 39–117)
Bilirubin, Direct: 0.1 mg/dL (ref 0.0–0.3)
TOTAL PROTEIN: 6.8 g/dL (ref 6.0–8.3)
Total Bilirubin: 0.5 mg/dL (ref 0.2–1.2)

## 2017-10-18 LAB — TSH: TSH: 1.17 u[IU]/mL (ref 0.35–4.50)

## 2017-10-23 ENCOUNTER — Encounter: Payer: Self-pay | Admitting: Internal Medicine

## 2017-10-23 ENCOUNTER — Ambulatory Visit (INDEPENDENT_AMBULATORY_CARE_PROVIDER_SITE_OTHER): Payer: Medicare Other | Admitting: Internal Medicine

## 2017-10-23 DIAGNOSIS — E7800 Pure hypercholesterolemia, unspecified: Secondary | ICD-10-CM

## 2017-10-23 DIAGNOSIS — E559 Vitamin D deficiency, unspecified: Secondary | ICD-10-CM

## 2017-10-23 DIAGNOSIS — K219 Gastro-esophageal reflux disease without esophagitis: Secondary | ICD-10-CM

## 2017-10-23 DIAGNOSIS — Z0000 Encounter for general adult medical examination without abnormal findings: Secondary | ICD-10-CM

## 2017-10-23 DIAGNOSIS — Z6841 Body Mass Index (BMI) 40.0 and over, adult: Secondary | ICD-10-CM

## 2017-10-23 DIAGNOSIS — F439 Reaction to severe stress, unspecified: Secondary | ICD-10-CM

## 2017-10-23 DIAGNOSIS — E039 Hypothyroidism, unspecified: Secondary | ICD-10-CM | POA: Diagnosis not present

## 2017-10-23 NOTE — Progress Notes (Signed)
Patient ID: Jodi Harris, female   DOB: May 20, 1940, 77 y.o.   MRN: [id number]   Subjective:    Patient ID: Jodi Harris, female    DOB: 01-Oct-1940, 77 y.o.   MRN: [id number]  HPI  Patient here for her physical exam.  She has a history of bariatric surgery.  Recently evaluated by her bariatric surgeon and started on phentermine.  States she is taking 1/2 tablet.  Tolerating.  Discussed diet and exercise.  Has lost some weight.  No chest pain.  No sob.  Minimal acid reflux.  Taking TUMS.  No abdominal pain.  Bowels moving.  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  S/p "gel injection" left knee.  Has f/u planned in one month.  Handling stress.     Past Medical History:  Diagnosis Date  . Anemia   . GERD (gastroesophageal reflux disease)   . Hypercholesterolemia   . Hypothyroidism   . Thrombophlebitis    varicosities   Past Surgical History:  Procedure Laterality Date  . COLON SURGERY     laparoscopic assisted transverse colectomy for tubular adenomatous polyp  . LAPAROSCOPIC GASTRIC BANDING    . URETHRAL DIVERTICULUM REPAIR  1974   Dr Jenne Pane   Family History  Problem Relation Age of Onset  . Cancer Father        lymphoma  . Cancer Maternal Aunt        Breast  . Breast cancer Maternal Aunt 50  . Colon cancer Unknown        one relative on maternal side   Social History   Socioeconomic History  . Marital status: Married    Spouse name: Not on file  . Number of children: 2  . Years of education: Not on file  . Highest education level: Not on file  Occupational History  . Not on file  Social Needs  . Financial resource strain: Not on file  . Food insecurity:    Worry: Not on file    Inability: Not on file  . Transportation needs:    Medical: Not on file    Non-medical: Not on file  Tobacco Use  . Smoking status: Former Games developer  . Smokeless tobacco: Never Used  . Tobacco comment: smoked as a teenager  Substance and Sexual Activity  . Alcohol  use: Yes    Alcohol/week: 0.0 standard drinks    Comment: has a glass of wine about once a month  . Drug use: No  . Sexual activity: Never  Lifestyle  . Physical activity:    Days per week: Not on file    Minutes per session: Not on file  . Stress: Not on file  Relationships  . Social connections:    Talks on phone: Not on file    Gets together: Not on file    Attends religious service: Not on file    Active member of club or organization: Not on file    Attends meetings of clubs or organizations: Not on file    Relationship status: Not on file  Other Topics Concern  . Not on file  Social History Narrative  . Not on file    Outpatient Encounter Medications as of 10/23/2017  Medication Sig  . calcium carbonate (TUMS EX) 750 MG chewable tablet Chew 1 tablet by mouth as needed.   . Cholecalciferol (VITAMIN D-3 PO) Take 2,000 Units by mouth daily.  Marland Kitchen esomeprazole (NEXIUM) 20 MG capsule Take 20 mg by mouth daily  at 12 noon.  . fluticasone (FLONASE) 50 MCG/ACT nasal spray Place 2 sprays into both nostrils daily as needed.  Marland Kitchen levothyroxine (SYNTHROID) 137 MCG tablet Take 1 tablet (137 mcg total) by mouth daily before breakfast.  . mupirocin ointment (BACTROBAN) 2 % Apply to affected area bid.  . Naproxen Sodium (ALEVE PO) Take by mouth as needed.   . phentermine (ADIPEX-P) 37.5 MG tablet Take 0.5 tablets by mouth daily.  . Probiotic Product (ALIGN PO) Take 1 capsule by mouth daily.  . rosuvastatin (CRESTOR) 5 MG tablet Take 1 tablet (5 mg total) by mouth daily.   No facility-administered encounter medications on file as of 10/23/2017.     Review of Systems  Constitutional: Negative for appetite change, fatigue and unexpected weight change.  HENT: Negative for congestion and sinus pressure.   Eyes: Negative for pain and visual disturbance.  Respiratory: Negative for cough, chest tightness and shortness of breath.   Cardiovascular: Negative for chest pain, palpitations and leg  swelling.  Gastrointestinal: Negative for abdominal pain, diarrhea, nausea and vomiting.  Genitourinary: Negative for dysuria and frequency.  Musculoskeletal: Negative for joint swelling and myalgias.  Skin: Negative for color change and rash.  Neurological: Negative for dizziness, light-headedness and headaches.  Hematological: Negative for adenopathy. Does not bruise/bleed easily.  Psychiatric/Behavioral: Negative for agitation and dysphoric mood.       Objective:    Physical Exam  Constitutional: She is oriented to person, place, and time. She appears well-developed and well-nourished. No distress.  HENT:  Nose: Nose normal.  Mouth/Throat: Oropharynx is clear and moist.  Eyes: Right eye exhibits no discharge. Left eye exhibits no discharge. No scleral icterus.  Neck: Neck supple. No thyromegaly present.  Cardiovascular: Normal rate and regular rhythm.  Pulmonary/Chest: Breath sounds normal. No accessory muscle usage. No tachypnea. No respiratory distress. She has no decreased breath sounds. She has no wheezes. She has no rhonchi. Right breast exhibits no inverted nipple, no mass, no nipple discharge and no tenderness (no axillary adenopathy). Left breast exhibits no inverted nipple, no mass, no nipple discharge and no tenderness (no axilarry adenopathy).  Abdominal: Soft. Bowel sounds are normal. There is no tenderness.  Musculoskeletal: She exhibits no edema or tenderness.  Lymphadenopathy:    She has no cervical adenopathy.  Neurological: She is alert and oriented to person, place, and time.  Skin: No rash noted. No erythema.  Psychiatric: She has a normal mood and affect. Her behavior is normal.    BP 118/62 (BP Location: Left Arm, Patient Position: Sitting, Cuff Size: Large)   Pulse 78   Temp 97.6 F (36.4 C) (Oral)   Resp 18   Ht 5\' 4"  (1.626 m)   Wt 261 lb 12.8 oz (118.8 kg)   LMP 02/01/1993   SpO2 96%   BMI 44.94 kg/m  Wt Readings from Last 3 Encounters:  10/23/17  261 lb 12.8 oz (118.8 kg)  06/21/17 265 lb 12.8 oz (120.6 kg)  02/15/17 262 lb (118.8 kg)     Lab Results  Component Value Date   WBC 4.8 10/18/2017   HGB 13.2 10/18/2017   HCT 40.7 10/18/2017   PLT 206.0 10/18/2017   GLUCOSE 87 10/18/2017   CHOL 132 10/18/2017   TRIG 63.0 10/18/2017   HDL 50.90 10/18/2017   LDLDIRECT 129.8 02/15/2012   LDLCALC 68 10/18/2017   ALT 9 10/18/2017   AST 15 10/18/2017   NA 140 10/18/2017   K 4.8 10/18/2017   CL 103 10/18/2017  CREATININE 0.85 10/18/2017   BUN 22 10/18/2017   CO2 32 10/18/2017   TSH 1.17 10/18/2017    Mm Diag Breast Tomo Uni Right  Addendum Date: 05/04/2017   ADDENDUM REPORT: 05/04/2017 13:42 ADDENDUM: Today's diagnostic mammogram was actually on the right for a right-sided asymmetry. Electronically Signed   By: Gerome Sam III M.D   On: 05/04/2017 13:42   Result Date: 05/04/2017 CLINICAL DATA:  The patient was called back for a left breast asymmetry. EXAM: DIGITAL DIAGNOSTIC UNILATERAL RIGHT MAMMOGRAM WITH CAD AND TOMO COMPARISON:  Previous exam(s). ACR Breast Density Category b: There are scattered areas of fibroglandular density. FINDINGS: The left breast asymmetry resolves on additional imaging. Mammographic images were processed with CAD. IMPRESSION: No mammographic evidence of malignancy. RECOMMENDATION: Annual screening mammography. I have discussed the findings and recommendations with the patient. Results were also provided in writing at the conclusion of the visit. If applicable, a reminder letter will be sent to the patient regarding the next appointment. BI-RADS CATEGORY  2: Benign. Electronically Signed: By: Gerome Sam III M.D On: 05/03/2017 11:00       Assessment & Plan:   Problem List Items Addressed This Visit    BMI 45.0-49.9, adult [facility])    Recently had f/u with her bariatric surgeon.  On phentermine.  Discussed diet and exercise.  Follow.        Relevant Medications   phentermine (ADIPEX-P) 37.5 MG  tablet   GERD (gastroesophageal reflux disease)    Minimal acid reflux.  nexium 20mg  q day.  Follow.        Health care maintenance    Physical today 10/23/17.  Mammogram 04/12/17 - Birads 0.  Recommended f/u right breast mammogram and ultrasound.  F/u right breast mammogram and ultrasound 05/03/17 - Birads II.  Recommended f/u in one year.        Hypercholesterolemia    On crestor.  Low cholesterol diet and exercise.  Follow lipid panel and liver function tests.        Relevant Orders   Hepatic function panel   Lipid panel   Basic metabolic panel   Hypothyroidism    On thyroid replacement.  Follow tsh.       Stress    Doing well.  Follow.  Handling things well.        Vitamin D deficiency    Follow vitamin D level.            Dale Wilcox, MD

## 2017-10-27 ENCOUNTER — Encounter: Payer: Self-pay | Admitting: Internal Medicine

## 2017-10-27 NOTE — Assessment & Plan Note (Signed)
On thyroid replacement.  Follow tsh.  

## 2017-10-27 NOTE — Assessment & Plan Note (Signed)
Doing well.  Follow.  Handling things well.

## 2017-10-27 NOTE — Assessment & Plan Note (Signed)
Physical today 10/23/17.  Mammogram 04/12/17 - Birads 0.  Recommended f/u right breast mammogram and ultrasound.  F/u right breast mammogram and ultrasound 05/03/17 - Birads II.  Recommended f/u in one year.

## 2017-10-27 NOTE — Assessment & Plan Note (Signed)
Recently had f/u with her bariatric surgeon.  On phentermine.  Discussed diet and exercise.  Follow.

## 2017-10-27 NOTE — Assessment & Plan Note (Signed)
Minimal acid reflux.  nexium 20mg  q day.  Follow.

## 2017-10-27 NOTE — Assessment & Plan Note (Signed)
On crestor.  Low cholesterol diet and exercise.  Follow lipid panel and liver function tests.   

## 2017-10-27 NOTE — Assessment & Plan Note (Signed)
Follow vitamin D level.  

## 2018-02-01 ENCOUNTER — Telehealth: Payer: Self-pay | Admitting: *Deleted

## 2018-02-01 DIAGNOSIS — E7800 Pure hypercholesterolemia, unspecified: Secondary | ICD-10-CM

## 2018-02-01 NOTE — Telephone Encounter (Signed)
I have placed the order for liver panel.  I am ok if she gets lab in 02/2018.  Please let lab know just to draw liver panel and not other labs.  Thanks

## 2018-02-01 NOTE — Telephone Encounter (Signed)
Pt has a lab appt scheduled before her appt in April with you. Pt is wondering if we could check her liver function when she comes in for her AWV on 02/18/18. Pt stated Mariella Saa at [facility] is requesting her have it checked.

## 2018-02-01 NOTE — Telephone Encounter (Signed)
Pt is aware and note placed on denisa's schedule for her to have liver panel only checked

## 2018-02-01 NOTE — Telephone Encounter (Signed)
Copied from CRM [phone]#[id number]. Topic: General - Inquiry >> Feb 01, 2018  9:56 AM Wyonia HoughJohnson, Chaz E wrote: Reason for CRM: Mariella SaaSarah Stout PA with [facility] would like to have labs for the Pt done. Pt wants to know if she can have a lab order done for the day she comes in for her wellness visit on 02.10.2020 and give a copy to Mariella SaaSarah Stout at [facility]/ please advise

## 2018-02-18 ENCOUNTER — Ambulatory Visit (INDEPENDENT_AMBULATORY_CARE_PROVIDER_SITE_OTHER): Payer: Medicare Other

## 2018-02-18 VITALS — BP 126/64 | HR 73 | Temp 97.9°F | Resp 15 | Ht 64.0 in | Wt 242.1 lb

## 2018-02-18 DIAGNOSIS — E7800 Pure hypercholesterolemia, unspecified: Secondary | ICD-10-CM

## 2018-02-18 DIAGNOSIS — Z0000 Encounter for general adult medical examination without abnormal findings: Secondary | ICD-10-CM | POA: Diagnosis not present

## 2018-02-18 LAB — HEPATIC FUNCTION PANEL
ALBUMIN: 3.8 g/dL (ref 3.5–5.2)
ALK PHOS: 73 U/L (ref 39–117)
ALT: 10 U/L (ref 0–35)
AST: 17 U/L (ref 0–37)
Bilirubin, Direct: 0.1 mg/dL (ref 0.0–0.3)
Total Bilirubin: 0.5 mg/dL (ref 0.2–1.2)
Total Protein: 6.7 g/dL (ref 6.0–8.3)

## 2018-02-18 NOTE — Progress Notes (Signed)
Subjective:   Jodi Harris is a 78 y.o. female who presents for Medicare Annual (Subsequent) preventive examination.  Review of Systems:  No ROS.  Medicare Wellness Visit. Additional risk factors are reflected in the social history. Cardiac Risk Factors include: advanced age (>54men, >14 women)     Objective:     Vitals: BP 126/64 (BP Location: Left Arm, Patient Position: Sitting, Cuff Size: Normal)   Pulse 73   Temp 97.9 F (36.6 C) (Oral)   Resp 15   Ht 5\' 4"  (1.626 m)   Wt 242 lb 1.9 oz (109.8 kg)   LMP 02/01/1993   SpO2 98%   BMI 41.56 kg/m   Body mass index is 41.56 kg/m.  Advanced Directives 02/18/2018 02/15/2017 02/14/2016 01/29/2015  Does Patient Have a Medical Advance Directive? Yes Yes Yes Yes  Type of Estate agent of [place];Living will Healthcare Power of [place];Living will Healthcare Power of [place];Living will Healthcare Power of [place];Living will  Does patient want to make changes to medical advance directive? No - Patient declined No - Patient declined No - Patient declined No - Patient declined  Copy of Healthcare Power of Attorney in Chart? No - copy requested No - copy requested No - copy requested No - copy requested    Tobacco Social History   Tobacco Use  Smoking Status Former Smoker  Smokeless Tobacco Never Used  Tobacco Comment   smoked as a teenager     Counseling given: Not Answered Comment: smoked as a teenager   Clinical Intake:  Pre-visit preparation completed: Yes  Pain : No/denies pain     Diabetes: No  How often do you need to have someone help you when you read instructions, pamphlets, or other written materials from your doctor or pharmacy?: 1 - Never  Interpreter Needed?: No     Past Medical History:  Diagnosis Date  . Anemia   . GERD (gastroesophageal reflux disease)   . Hypercholesterolemia   . Hypothyroidism   . Thrombophlebitis    varicosities   Past Surgical History:    Procedure Laterality Date  . COLON SURGERY     laparoscopic assisted transverse colectomy for tubular adenomatous polyp  . LAPAROSCOPIC GASTRIC BANDING    . URETHRAL DIVERTICULUM REPAIR  1974   Dr Jenne Pane   Family History  Problem Relation Age of Onset  . Cancer Father        lymphoma  . Cancer Maternal Aunt        Breast  . Breast cancer Maternal Aunt 50  . Colon cancer Other        one relative on maternal side   Social History   Socioeconomic History  . Marital status: Married    Spouse name: Not on file  . Number of children: 2  . Years of education: Not on file  . Highest education level: Not on file  Occupational History  . Not on file  Social Needs  . Financial resource strain: Not hard at all  . Food insecurity:    Worry: Never true    Inability: Never true  . Transportation needs:    Medical: No    Non-medical: No  Tobacco Use  . Smoking status: Former Games developer  . Smokeless tobacco: Never Used  . Tobacco comment: smoked as a teenager  Substance and Sexual Activity  . Alcohol use: Yes    Alcohol/week: 0.0 standard drinks    Comment: has a glass of wine about once a month  .  Drug use: No  . Sexual activity: Never  Lifestyle  . Physical activity:    Days per week: Not on file    Minutes per session: Not on file  . Stress: Not on file  Relationships  . Social connections:    Talks on phone: Not on file    Gets together: Not on file    Attends religious service: Not on file    Active member of club or organization: Not on file    Attends meetings of clubs or organizations: Not on file    Relationship status: Not on file  Other Topics Concern  . Not on file  Social History Narrative  . Not on file    Outpatient Encounter Medications as of 02/18/2018  Medication Sig  . calcium carbonate (TUMS EX) 750 MG chewable tablet Chew 1 tablet by mouth as needed.   . Cholecalciferol (VITAMIN D-3 PO) Take 2,000 Units by mouth daily.  Marland Kitchen. esomeprazole (NEXIUM) 20  MG capsule Take 20 mg by mouth daily at 12 noon.  . fluticasone (FLONASE) 50 MCG/ACT nasal spray Place 2 sprays into both nostrils daily as needed.  Marland Kitchen. levothyroxine (SYNTHROID) 137 MCG tablet Take 1 tablet (137 mcg total) by mouth daily before breakfast.  . mupirocin ointment (BACTROBAN) 2 % Apply to affected area bid.  . Naproxen Sodium (ALEVE PO) Take by mouth as needed.   . phentermine (ADIPEX-P) 37.5 MG tablet Take 0.5 tablets by mouth daily.  . Probiotic Product (ALIGN PO) Take 1 capsule by mouth daily.  . rosuvastatin (CRESTOR) 5 MG tablet Take 1 tablet (5 mg total) by mouth daily.   No facility-administered encounter medications on file as of 02/18/2018.     Activities of Daily Living In your present state of health, do you have any difficulty performing the following activities: 02/18/2018  Hearing? N  Vision? N  Difficulty concentrating or making decisions? N  Walking or climbing stairs? N  Dressing or bathing? N  Doing errands, shopping? N  Preparing Food and eating ? N  Using the Toilet? N  In the past six months, have you accidently leaked urine? N  Do you have problems with loss of bowel control? N  Managing your Medications? N  Managing your Finances? N  Housekeeping or managing your Housekeeping? N  Some recent data might be hidden    Patient Care Team: Dale DurhamScott, Charlene, MD as PCP - General (Internal Medicine)    Assessment:   This is a routine wellness examination for Jodi DecSara.  Sent to lab for liver panel only, per note.   Health Screenings  Mammogram -04/12/17 Colonoscopy -03/31/14 Bone Density-03/08/15 Glaucoma-none  Hearing-demonstrates normal hearing during conversation Glucose- 10/18/17 (87) Cholesterol -10/18/17 (132) Dental- visits every 6 months Vision-every 12 months  Social  Alcohol intake -yes, 1 per month Smoking history- former Smokers in home? none Illicit drug use? none Exercise -none. She plans to increase her physical activity.  Diet  -regular Sexually Active -never  Safety  Patient feels safe at home.  Patient does have smoke detectors at home  Patient does wear sunscreen or protective clothing when in direct sunlight  Patient does wear seat belt when driving or riding with others.   Activities of Daily Living Patient can do their own household chores. Denies needing assistance with: driving, feeding themselves, getting from bed to chair, getting to the toilet, bathing/showering, dressing, managing money, climbing flight of stairs, or preparing meals.   Depression Screen Patient denies losing interest in daily life,  feeling hopeless, or crying easily over simple problems.   Fall Screen Patient denies being afraid of falling or falling in the last year.   Memory Screen Patient denies problems with memory, misplacing items, and is able to balance checkbook/bank accounts.  Patient is alert, normal appearance, oriented to person/place/and time. Correctly identified the president of the [place], recall of 2/3 objects, and performing simple calculations.  Patient displays appropriate judgement and can read correct time from watch face.   Immunizations The following Immunizations are up to date: Influenza, shingles, pneumonia, and tetanus.   Other Providers Patient Care Team: Dale DurhamScott, Charlene, MD as PCP - General (Internal Medicine)  Exercise Activities and Dietary recommendations Current Exercise Habits: The patient does not participate in regular exercise at present  Goals      Patient Stated   . Increase physical activity (pt-stated)     Lose weight       Fall Risk Fall Risk  02/18/2018 02/15/2017 02/14/2016 12/14/2015 11/03/2015  Falls in the past year? 0 No No No No  Number falls in past yr: - - - - -  Injury with Fall? - - - - -  Risk for fall due to : - - - - -    Depression Screen PHQ 2/9 Scores 02/18/2018 02/15/2017 02/14/2016 12/14/2015  PHQ - 2 Score 0 0 0 0     Cognitive Function MMSE - Mini Mental  State Exam 02/15/2017 01/29/2015  Orientation to time 5 5  Orientation to Place 5 5  Registration 3 3  Attention/ Calculation 5 5  Recall 3 3  Language- name 2 objects 2 2  Language- repeat 1 1  Language- follow 3 step command 3 3  Language- read & follow direction 1 1  Write a sentence 1 1  Copy design 1 1  Total score 30 30     6CIT Screen 02/18/2018 02/14/2016  What Year? 0 points 0 points  What month? 0 points 0 points  What time? 0 points 0 points  Count back from 20 0 points 0 points  Months in reverse 0 points 0 points  Repeat phrase 0 points 0 points  Total Score 0 0    Immunization History  Administered Date(s) Administered  . Influenza Split 12/03/2011, 11/12/2012, 11/13/2013  . Influenza, High Dose Seasonal PF 11/23/2017  . Influenza,inj,quad, With Preservative 11/17/2016  . Influenza-Unspecified 11/14/2014, 11/04/2015  . Pneumococcal Conjugate-13 04/24/2013  . Pneumococcal Polysaccharide-23 02/02/2012  . Tdap 03/10/2010  . Zoster 07/01/2008  . Zoster Recombinat (Shingrix) 12/14/2017   Screening Tests Health Maintenance  Topic Date Due  . MAMMOGRAM  04/13/2018  . COLONOSCOPY  03/31/2019  . TETANUS/TDAP  03/09/2020  . INFLUENZA VACCINE  Completed  . DEXA SCAN  Completed  . PNA vac Low Risk Adult  Completed       Plan:    End of life planning; Advance aging; Advanced directives discussed. Copy of current HCPOA/Living Will requested.    I have personally reviewed and noted the following in the patient's chart:   . Medical and social history . Use of alcohol, tobacco or illicit drugs  . Current medications and supplements . Functional ability and status . Nutritional status . Physical activity . Advanced directives . List of other physicians . Hospitalizations, surgeries, and ER visits in previous 12 months . Vitals . Screenings to include cognitive, depression, and falls . Referrals and appointments  In addition, I have reviewed and discussed with  patient certain preventive protocols, quality metrics, and  best practice recommendations. A written personalized care plan for preventive services as well as general preventive health recommendations were provided to patient.     Ashok Pall, LPN  1/61/0960   Reviewed above information.  Agree with assessment and plan.    Dr Lorin Picket

## 2018-02-18 NOTE — Patient Instructions (Addendum)
  [name] , Thank you for taking time to come for your Medicare Wellness Visit. I appreciate your ongoing commitment to your health goals. Please review the following plan we discussed and let me know if I can assist you in the future.   Follow up as needed.    Bring a copy of your Health Care Power of Attorney and/or Living Will to be scanned into chart.  Have a great day!  These are the goals we discussed: Goals      Patient Stated   . Increase physical activity (pt-stated)     Lose weight       This is a list of the screening recommended for you and due dates:  Health Maintenance  Topic Date Due  . Mammogram  04/13/2018  . Colon Cancer Screening  03/31/2019  . Tetanus Vaccine  03/09/2020  . Flu Shot  Completed  . DEXA scan (bone density measurement)  Completed  . Pneumonia vaccines  Completed

## 2018-03-22 ENCOUNTER — Other Ambulatory Visit: Payer: Self-pay | Admitting: Internal Medicine

## 2018-03-25 ENCOUNTER — Ambulatory Visit: Admit: 2018-03-25 | Payer: Medicare Other | Admitting: Gastroenterology

## 2018-03-25 SURGERY — COLONOSCOPY WITH PROPOFOL
Anesthesia: General

## 2018-04-25 ENCOUNTER — Telehealth: Payer: Self-pay

## 2018-04-25 NOTE — Telephone Encounter (Signed)
Copied from CRM [id number]. Topic: General - Other >> Apr 24, 2018  4:42 PM Jay Schlichter wrote: Reason for CRM: [phone] Pt has questions about her appt this fri for labs and appt next tues. She wants to know if her vitals will be checked if it is virtual appt, please call back

## 2018-04-25 NOTE — Telephone Encounter (Signed)
Patient is aware to come for labs. She is going to check weight at home and do virtual visit on Tuesday. Pt was agreeable.

## 2018-04-26 ENCOUNTER — Other Ambulatory Visit (INDEPENDENT_AMBULATORY_CARE_PROVIDER_SITE_OTHER): Payer: Medicare Other

## 2018-04-26 ENCOUNTER — Other Ambulatory Visit: Payer: Self-pay

## 2018-04-26 DIAGNOSIS — E7800 Pure hypercholesterolemia, unspecified: Secondary | ICD-10-CM

## 2018-04-26 LAB — BASIC METABOLIC PANEL
BUN: 17 mg/dL (ref 6–23)
CO2: 32 mEq/L (ref 19–32)
Calcium: 9.2 mg/dL (ref 8.4–10.5)
Chloride: 103 mEq/L (ref 96–112)
Creatinine, Ser: 0.74 mg/dL (ref 0.40–1.20)
GFR: 76.01 mL/min (ref >60.00)
Glucose, Bld: 96 mg/dL (ref 70–99)
Potassium: 4.2 mEq/L (ref 3.5–5.1)
Sodium: 141 mEq/L (ref 135–145)

## 2018-04-26 LAB — HEPATIC FUNCTION PANEL
ALT: 10 U/L (ref 0–35)
AST: 14 U/L (ref 0–37)
Albumin: 3.7 g/dL (ref 3.5–5.2)
Alkaline Phosphatase: 75 U/L (ref 39–117)
Bilirubin, Direct: 0.1 mg/dL (ref 0.0–0.3)
Total Bilirubin: 0.5 mg/dL (ref 0.2–1.2)
Total Protein: 6.2 g/dL (ref 6.0–8.3)

## 2018-04-26 LAB — LIPID PANEL
Cholesterol: 136 mg/dL (ref 0–200)
HDL: 48.7 mg/dL (ref >39.00)
LDL Cholesterol: 74 mg/dL (ref 0–99)
NonHDL: 87.07
Total CHOL/HDL Ratio: 3
Triglycerides: 65 mg/dL (ref 0.0–149.0)
VLDL: 13 mg/dL (ref 0.0–40.0)

## 2018-04-28 ENCOUNTER — Encounter: Payer: Self-pay | Admitting: Internal Medicine

## 2018-04-30 ENCOUNTER — Ambulatory Visit (INDEPENDENT_AMBULATORY_CARE_PROVIDER_SITE_OTHER): Payer: Medicare Other | Admitting: Internal Medicine

## 2018-04-30 ENCOUNTER — Encounter: Payer: Self-pay | Admitting: Internal Medicine

## 2018-04-30 VITALS — Wt 236.0 lb

## 2018-04-30 DIAGNOSIS — K219 Gastro-esophageal reflux disease without esophagitis: Secondary | ICD-10-CM

## 2018-04-30 DIAGNOSIS — E559 Vitamin D deficiency, unspecified: Secondary | ICD-10-CM

## 2018-04-30 DIAGNOSIS — E7800 Pure hypercholesterolemia, unspecified: Secondary | ICD-10-CM | POA: Diagnosis not present

## 2018-04-30 DIAGNOSIS — Z86010 Personal history of colonic polyps: Secondary | ICD-10-CM | POA: Diagnosis not present

## 2018-04-30 DIAGNOSIS — Z713 Dietary counseling and surveillance: Secondary | ICD-10-CM

## 2018-04-30 DIAGNOSIS — E039 Hypothyroidism, unspecified: Secondary | ICD-10-CM | POA: Diagnosis not present

## 2018-04-30 DIAGNOSIS — Z1239 Encounter for other screening for malignant neoplasm of breast: Secondary | ICD-10-CM

## 2018-04-30 DIAGNOSIS — M25561 Pain in right knee: Secondary | ICD-10-CM

## 2018-04-30 NOTE — Progress Notes (Addendum)
Patient ID: Jodi Harris, female   DOB: 09/08/1940, 78 y.o.   MRN: [id number] Virtual Visit via Video Note  This visit type was conducted due to national recommendations for restrictions regarding the COVID-19 pandemic (e.g. social distancing).  This format is felt to be most appropriate for this patient at this time.  All issues noted in this document were discussed and addressed.  No physical exam was performed (except for noted visual exam findings with Video Visits).   I connected with Jodi Harris on 04/30/18 at 10:30 AM EDT by video.  Verified that I am speaking with the correct person using two identifiers. Location patient: home Location provider: work  Persons participating in the virtual visit: patient, provider  I discussed the limitations, risks, security and privacy concerns of performing an evaluation and management service by video and the availability of in person appointments.The patient expressed understanding and agreed to proceed.   Reason for visit:  Scheduled follow up.    HPI: This was a scheduled follow up.  She reports she is doing well.  Has been followed by bariatric clinic at [facility].  Has been taking phentermine.  States since starting phentermine, she feels better. Is brighter and has more energy.  Has lost weight.  Weight this am 236 pounds.  Tolerating phentermine.  No chest pain.  No sob.  No cough or congestion.  No known COVID exposure.  Staying in.  Tolerating crestor.  She did fall and was evaluated by ortho 02/28/18.  Injured her right knee.  Had a large hematoma.  Is better.  Still with numbness on the outside of her knee.  discussed with her today.  No numbness radiating up or down her leg.  No weakness.  No other injury.  Discussed f/u with ortho.  Also saw GI 03/14/18.  Has a history of adenomatous polyps.  Planning for f/u colonoscopy.  Discussed recent labs.  Overall she feels better.  Discussed continued diet and exercise.      ROS: See pertinent  positives and negatives per HPI.  Past Medical History:  Diagnosis Date  . Anemia   . GERD (gastroesophageal reflux disease)   . Hypercholesterolemia   . Hypothyroidism   . Thrombophlebitis    varicosities    Past Surgical History:  Procedure Laterality Date  . COLON SURGERY     laparoscopic assisted transverse colectomy for tubular adenomatous polyp  . LAPAROSCOPIC GASTRIC BANDING    . URETHRAL DIVERTICULUM REPAIR  1974   Dr [name]    Family History  Problem Relation Age of Onset  . Cancer Father        lymphoma  . Cancer Maternal Aunt        Breast  . Breast cancer Maternal Aunt 50  . Colon cancer Other        one relative on maternal side    SOCIAL HX: reviewed.    Current Outpatient Medications:  .  calcium carbonate (TUMS EX) 750 MG chewable tablet, Chew 1 tablet by mouth as needed. , Disp: , Rfl:  .  Cholecalciferol (VITAMIN D-3 PO), Take 2,000 Units by mouth daily., Disp: , Rfl:  .  esomeprazole (NEXIUM) 20 MG capsule, Take 20 mg by mouth daily at 12 noon., Disp: , Rfl:  .  fluticasone (FLONASE) 50 MCG/ACT nasal spray, Place 2 sprays into both nostrils daily as needed., Disp: 16 g, Rfl: 5 .  mupirocin ointment (BACTROBAN) 2 %, Apply to affected area bid., Disp: 22 g, Rfl: 0 .  Naproxen Sodium (ALEVE PO), Take by mouth as needed. , Disp: , Rfl:  .  phentermine (ADIPEX-P) 37.5 MG tablet, Take 0.5 tablets by mouth daily., Disp: , Rfl:  .  Probiotic Product (ALIGN PO), Take 1 capsule by mouth daily., Disp: , Rfl:  .  rosuvastatin (CRESTOR) 5 MG tablet, Take 1 tablet (5 mg total) by mouth daily., Disp: 90 tablet, Rfl: 0 .  SYNTHROID 137 MCG tablet, Take 1 tablet (137 mcg total) by mouth daily before breakfast., Disp: 90 tablet, Rfl: 0  EXAM:  VITALS per patient if applicable: weight 236 pounds.   GENERAL: alert, oriented, appears well and in no acute distress  HEENT: atraumatic, conjunttiva clear, no obvious abnormalities on inspection of external nose and  ears  NECK: normal movements of the head and neck  LUNGS: on inspection no signs of respiratory distress, breathing rate appears normal, no obvious gross SOB, gasping or wheezing  CV: no obvious cyanosis  PSYCH/NEURO: pleasant and cooperative, no obvious depression or anxiety, speech and thought processing grossly intact  ASSESSMENT AND PLAN:  Discussed the following assessment and plan:  Breast cancer screening - Plan: MM 3D SCREEN BREAST BILATERAL  Gastroesophageal reflux disease, esophagitis presence not specified  History of colon polyps  Hypercholesterolemia  Hypothyroidism, unspecified type  Vitamin D deficiency  Acute pain of right knee  Weight loss counseling, encounter for  GERD (gastroesophageal reflux disease) Symptoms controlled.    History of colon polyps Saw GI.  Planning for f/u colonoscopy.    Hypercholesterolemia On crestor.  Low cholesterol diet and exercise.  Follow lipid panel and liver function tests.    Hypothyroidism On thyroid replacement.  Follow tsh.   Vitamin D deficiency Follow vitamin D level.    Right knee pain Right knee injury as outlined.  Some residual numbness as outlined.  No hematoma now.  Walking with no problem.  Discussed with her today.  Discussed f/u with ortho.    Weight loss counseling, encounter for Discussed weight loss.  She has been followed at the bariatric clinic.  On phentermine.  Tolerating and feels good on the medication.  Discussed a trial off, since she has been on the medication for a few months.  Continue diet and exercise.  Follow.      I discussed the assessment and treatment plan with the patient. The patient was provided an opportunity to ask questions and all were answered. The patient agreed with the plan and demonstrated an understanding of the instructions.   The patient was advised to call back or seek an in-person evaluation if the symptoms worsen or if the condition fails to improve as  anticipated.    Dale Gratis, MD

## 2018-05-03 ENCOUNTER — Other Ambulatory Visit: Payer: Self-pay | Admitting: Internal Medicine

## 2018-05-04 ENCOUNTER — Encounter: Payer: Self-pay | Admitting: Internal Medicine

## 2018-05-04 DIAGNOSIS — M25561 Pain in right knee: Secondary | ICD-10-CM | POA: Insufficient documentation

## 2018-05-04 DIAGNOSIS — Z713 Dietary counseling and surveillance: Secondary | ICD-10-CM | POA: Insufficient documentation

## 2018-05-04 NOTE — Assessment & Plan Note (Signed)
Symptoms controlled

## 2018-05-04 NOTE — Assessment & Plan Note (Signed)
Discussed weight loss.  She has been followed at the bariatric clinic.  On phentermine.  Tolerating and feels good on the medication.  Discussed a trial off, since she has been on the medication for a few months.  Continue diet and exercise.  Follow.

## 2018-05-04 NOTE — Assessment & Plan Note (Signed)
Right knee injury as outlined.  Some residual numbness as outlined.  No hematoma now.  Walking with no problem.  Discussed with her today.  Discussed f/u with ortho.

## 2018-05-04 NOTE — Assessment & Plan Note (Signed)
Follow vitamin D level.  

## 2018-05-04 NOTE — Assessment & Plan Note (Signed)
Saw GI.  Planning for f/u colonoscopy.   

## 2018-05-04 NOTE — Assessment & Plan Note (Signed)
On crestor.  Low cholesterol diet and exercise.  Follow lipid panel and liver function tests.   

## 2018-05-04 NOTE — Assessment & Plan Note (Signed)
On thyroid replacement.  Follow tsh.  

## 2018-05-06 ENCOUNTER — Telehealth: Payer: Self-pay

## 2018-05-06 NOTE — Telephone Encounter (Signed)
Mammogram scheduled.

## 2018-05-06 NOTE — Telephone Encounter (Signed)
-----   Message from Dale [place], MD sent at 05/04/2018  5:21 PM EDT ----- Regarding: mammogram Needs mammogram scheduled.    Dr Lorin Picket

## 2018-07-10 ENCOUNTER — Other Ambulatory Visit: Payer: Self-pay

## 2018-07-10 ENCOUNTER — Ambulatory Visit
Admission: RE | Admit: 2018-07-10 | Discharge: 2018-07-10 | Disposition: A | Discharge status: Home / Self Care | Payer: Medicare Other | Source: Ambulatory Visit | Attending: Internal Medicine | Admitting: Internal Medicine

## 2018-07-10 DIAGNOSIS — Z1231 Encounter for screening mammogram for malignant neoplasm of breast: Secondary | ICD-10-CM | POA: Diagnosis present

## 2018-07-10 DIAGNOSIS — Z1239 Encounter for other screening for malignant neoplasm of breast: Secondary | ICD-10-CM

## 2018-07-25 ENCOUNTER — Other Ambulatory Visit
Admission: RE | Admit: 2018-07-25 | Discharge: 2018-07-25 | Disposition: A | Discharge status: Home / Self Care | Payer: Medicare Other | Source: Ambulatory Visit | Attending: Gastroenterology | Admitting: Gastroenterology

## 2018-07-25 ENCOUNTER — Other Ambulatory Visit: Payer: Self-pay

## 2018-07-25 DIAGNOSIS — Z1159 Encounter for screening for other viral diseases: Secondary | ICD-10-CM | POA: Insufficient documentation

## 2018-07-25 LAB — SARS CORONAVIRUS 2 (TAT 6-24 HRS): SARS Coronavirus 2: NEGATIVE

## 2018-07-26 ENCOUNTER — Encounter: Payer: Self-pay | Admitting: *Deleted

## 2018-07-29 ENCOUNTER — Ambulatory Visit: Payer: Medicare Other | Admitting: Anesthesiology

## 2018-07-29 ENCOUNTER — Ambulatory Visit
Admission: RE | Admit: 2018-07-29 | Discharge: 2018-07-29 | Disposition: A | Discharge status: Home / Self Care | Payer: Medicare Other | Attending: Gastroenterology | Admitting: Gastroenterology

## 2018-07-29 ENCOUNTER — Encounter: Payer: Self-pay | Admitting: *Deleted

## 2018-07-29 ENCOUNTER — Other Ambulatory Visit: Payer: Self-pay

## 2018-07-29 ENCOUNTER — Encounter
Admission: RE | Disposition: A | Discharge status: Home / Self Care | Payer: Self-pay | Source: Home / Self Care | Attending: Gastroenterology

## 2018-07-29 DIAGNOSIS — Z1211 Encounter for screening for malignant neoplasm of colon: Secondary | ICD-10-CM | POA: Diagnosis not present

## 2018-07-29 DIAGNOSIS — E7800 Pure hypercholesterolemia, unspecified: Secondary | ICD-10-CM | POA: Insufficient documentation

## 2018-07-29 DIAGNOSIS — Z79899 Other long term (current) drug therapy: Secondary | ICD-10-CM | POA: Insufficient documentation

## 2018-07-29 DIAGNOSIS — Z9049 Acquired absence of other specified parts of digestive tract: Secondary | ICD-10-CM | POA: Insufficient documentation

## 2018-07-29 DIAGNOSIS — Z86010 Personal history of colonic polyps: Secondary | ICD-10-CM | POA: Insufficient documentation

## 2018-07-29 DIAGNOSIS — Z880 Allergy status to penicillin: Secondary | ICD-10-CM | POA: Insufficient documentation

## 2018-07-29 DIAGNOSIS — K5730 Diverticulosis of large intestine without perforation or abscess without bleeding: Secondary | ICD-10-CM | POA: Diagnosis not present

## 2018-07-29 DIAGNOSIS — E039 Hypothyroidism, unspecified: Secondary | ICD-10-CM | POA: Insufficient documentation

## 2018-07-29 DIAGNOSIS — K219 Gastro-esophageal reflux disease without esophagitis: Secondary | ICD-10-CM | POA: Insufficient documentation

## 2018-07-29 DIAGNOSIS — Z800 Family history of malignant neoplasm of digestive organs: Secondary | ICD-10-CM | POA: Diagnosis not present

## 2018-07-29 DIAGNOSIS — Z885 Allergy status to narcotic agent status: Secondary | ICD-10-CM | POA: Diagnosis not present

## 2018-07-29 HISTORY — DX: Personal history of colonic polyps: Z86.010

## 2018-07-29 HISTORY — DX: Personal history of colon polyps, unspecified: Z86.0100

## 2018-07-29 HISTORY — PX: COLONOSCOPY WITH PROPOFOL: SHX5780

## 2018-07-29 HISTORY — DX: Diverticulosis of intestine, part unspecified, without perforation or abscess without bleeding: K57.90

## 2018-07-29 HISTORY — DX: Other hemorrhoids: K64.8

## 2018-07-29 SURGERY — COLONOSCOPY WITH PROPOFOL
Anesthesia: General

## 2018-07-29 MED ORDER — SODIUM CHLORIDE 0.9 % IV SOLN: INTRAVENOUS | Status: DC

## 2018-07-29 MED ORDER — PROPOFOL 10 MG/ML IV BOLUS
INTRAVENOUS | Status: DC | PRN
  Administered 2018-07-29: 20 mg via INTRAVENOUS
  Administered 2018-07-29: 30 mg via INTRAVENOUS
  Administered 2018-07-29: 50 mg via INTRAVENOUS

## 2018-07-29 MED ORDER — SODIUM CHLORIDE 0.9 % IV SOLN
INTRAVENOUS | Status: DC
  Administered 2018-07-29: 13:00:00 via INTRAVENOUS

## 2018-07-29 MED ORDER — PROPOFOL 500 MG/50ML IV EMUL
INTRAVENOUS | Status: DC | PRN
  Administered 2018-07-29: 150 ug/kg/min via INTRAVENOUS

## 2018-07-29 NOTE — Op Note (Signed)
[facility] Gastroenterology Patient Name: Jodi ModySara Harris Procedure Date: 07/29/2018 12:52 PM MRN: [id number] Account #: [id number] Date of Birth: 06/06/1940 Admit Type: Outpatient Age: [age] [facility] ROOM 3 Gender: Female Note Status: Finalized Procedure:            Colonoscopy Indications:          Personal history of colonic polyps Providers:            Christena DeemMartin U. Skulskie, MD Referring MD:         Dale Durhamharlene Scott, MD (Referring MD) Medicines:            Monitored Anesthesia Care Complications:        No immediate complications. Procedure:            Pre-Anesthesia Assessment:                       - ASA Grade Assessment: II - A patient with mild                        systemic disease.                       After obtaining informed consent, the colonoscope was                        passed under direct vision. Throughout the procedure,                        the patient's blood pressure, pulse, and oxygen                        saturations were monitored continuously. The was                        introduced through the anus and advanced to the the                        cecum, identified by the ileocecal valve. The                        colonoscopy was performed with moderate difficulty due                        to a redundant colon. Successful completion of the                        procedure was aided by changing the patient to a supine                        position, changing the patient to a prone position,                        using manual pressure and withdrawing and reinserting                        the scope. The patient tolerated the procedure. The                        quality of the bowel preparation was good. Findings:      Multiple small-mouthed diverticula were  found in the sigmoid colon,       descending colon and transverse colon. I was unable to completely turn       the IC valve, however the visualized cecum and IC valve were  normal.      The colon (entire examined portion) was significantly redundant.      The exam was otherwise normal throughout the examined colon. Impression:           - Diverticulosis in the sigmoid colon, in the                        descending colon and in the transverse colon.                       - Redundant colon.                       - No specimens collected. Recommendation:       - Discharge patient to home.                       - Repeat colonoscopy in 5 years for adenoma                        surveillance. Procedure Code(s):    --- Professional ---                       [phone], Colonoscopy, flexible; diagnostic, including                        collection of specimen(s) by brushing or washing, when                        performed (separate procedure) Diagnosis Code(s):    --- Professional ---                       Z86.010, Personal history of colonic polyps                       K57.30, Diverticulosis of large intestine without                        perforation or abscess without bleeding                       Q43.8, Other specified congenital malformations of                        intestine CPT copyright 2019 American Medical Association. All rights reserved. The codes documented in this report are preliminary and upon coder review may  be revised to meet current compliance requirements. Lollie Sails, MD 07/29/2018 1:38:29 PM This report has been signed electronically. Number of Addenda: 0 Note Initiated On: 07/29/2018 12:52 PM Scope Withdrawal Time: 0 hours 12 minutes 11 seconds  Total Procedure Duration: 0 hours 29 minutes 44 seconds       [facility]

## 2018-07-29 NOTE — Transfer of Care (Signed)
Immediate Anesthesia Transfer of Care Note  Patient: Jodi Harris  Procedure(s) Performed: COLONOSCOPY WITH PROPOFOL (N/A )  Patient Location: PACU  Anesthesia Type:General  Level of Consciousness: awake and sedated  Airway & Oxygen Therapy: Patient Spontanous Breathing and Patient connected to nasal cannula oxygen  Post-op Assessment: Report given to RN and Post -op Vital signs reviewed and stable  Post vital signs: Reviewed and stable  Last Vitals:  Vitals Value Taken Time  BP 102/76 07/29/18 1336  Temp 36.2 C 07/29/18 1336  Pulse 79 07/29/18 1338  Resp 20 07/29/18 1338  SpO2 100 % 07/29/18 1338  Vitals shown include unvalidated device data.  Last Pain:  Vitals:   07/29/18 1336  TempSrc: Tympanic  PainSc: 0-No pain         Complications: No apparent anesthesia complications

## 2018-07-29 NOTE — H&P (Signed)
Outpatient short stay form Pre-procedure 07/29/2018 12:50 PM Jodi Sails MD  Primary Physician: Dr. Einar Pheasant  Reason for visit: Colonoscopy  History of present illness: Patient is a 78 year old female presenting today for colonoscopy in regards to her personal history of adenomatous colon polyps and family history of colon cancer primary relative, mother.  Patient has a history of a sigmoid colectomy due to nonresectable polyp several years ago.  Her last colonoscopy was 03/31/2014 with removal of one adenoma at that time.  She tolerated her prep well.  She takes no aspirin or blood thinning agent.    Current Facility-Administered Medications:  .  0.9 %  sodium chloride infusion, , Intravenous, Continuous, Jodi Sails, MD .  0.9 %  sodium chloride infusion, , Intravenous, Continuous, Jodi Sails, MD, Last Rate: 20 mL/hr at 07/29/18 1249  Medications Prior to Admission  Medication Sig Dispense Refill Last Dose  . calcium carbonate (TUMS EX) 750 MG chewable tablet Chew 1 tablet by mouth as needed.    Past Week at Unknown time  . Cholecalciferol (VITAMIN D-3 PO) Take 2,000 Units by mouth daily.   Past Week at Unknown time  . esomeprazole (NEXIUM) 20 MG capsule Take 20 mg by mouth daily at 12 noon.   Past Week at Unknown time  . fluticasone (FLONASE) 50 MCG/ACT nasal spray Place 2 sprays into both nostrils daily as needed. 16 g 5 Past Week at Unknown time  . rosuvastatin (CRESTOR) 5 MG tablet Take 1 tablet (5 mg total) by mouth daily. 90 tablet 0 Past Week at Unknown time  . SYNTHROID 137 MCG tablet Take 1 tablet (137 mcg total) by mouth daily before breakfast. 90 tablet 0 07/28/2018 at 0800  . FLUoxetine (PROZAC) 10 MG capsule Take 10 mg by mouth daily.     . mupirocin ointment (BACTROBAN) 2 % Apply to affected area bid. 22 g 0   . Naproxen Sodium (ALEVE PO) Take by mouth as needed.    Not Taking at Unknown time  . phentermine (ADIPEX-P) 37.5 MG tablet Take 0.5 tablets  by mouth daily.   Not Taking at Unknown time  . Probiotic Product (ALIGN PO) Take 1 capsule by mouth daily.        Allergies  Allergen Reactions  . Codeine Sulfate   . Penicillins      Past Medical History:  Diagnosis Date  . Anemia   . Diverticulosis   . GERD (gastroesophageal reflux disease)   . History of colonic polyps   . Hypercholesterolemia   . Hypothyroidism   . Internal hemorrhoids   . Thrombophlebitis    varicosities    Review of systems:      Physical Exam    Heart and lungs: Regular rate and rhythm without rub or gallop lungs are bilaterally clear    HEENT: Normocephalic atraumatic eyes are anicteric    Other:    Pertinant exam for procedure: Soft nontender nondistended bowel sounds positive normoactive.    Planned proceedures: Colonoscopy and indicated procedures. I have discussed the risks benefits and complications of procedures to include not limited to bleeding, infection, perforation and the risk of sedation and the patient wishes to proceed.    Jodi Sails, MD Gastroenterology 07/29/2018  12:50 PM

## 2018-07-29 NOTE — Anesthesia Preprocedure Evaluation (Signed)
Anesthesia Evaluation  Patient identified by MRN, date of birth, ID band Patient awake    Reviewed: Allergy & Precautions, H&P , NPO status , reviewed documented beta blocker date and time   Airway Mallampati: III  TM Distance: >3 FB Neck ROM: full    Dental  (+) Partial Upper, Implants   Pulmonary    Pulmonary exam normal        Cardiovascular Normal cardiovascular exam     Neuro/Psych    GI/Hepatic GERD  Controlled,  Endo/Other  Hypothyroidism   Renal/GU      Musculoskeletal   Abdominal   Peds  Hematology  (+) Blood dyscrasia, anemia ,   Anesthesia Other Findings Past Medical History: No date: Anemia No date: Diverticulosis No date: GERD (gastroesophageal reflux disease) No date: History of colonic polyps No date: Hypercholesterolemia No date: Hypothyroidism No date: Internal hemorrhoids No date: Thrombophlebitis     Comment:  varicosities  Past Surgical History: No date: COLON SURGERY     Comment:  laparoscopic assisted transverse colectomy for tubular               adenomatous polyp No date: COLONOSCOPY No date: DIAGNOSTIC LAPAROSCOPY No date: LAPAROSCOPIC GASTRIC BANDING 1974: URETHRAL DIVERTICULUM REPAIR     Comment:  Dr Redmond Baseman  BMI    Body Mass Index: 39.17 kg/m      Reproductive/Obstetrics                             Anesthesia Physical Anesthesia Plan  ASA: III  Anesthesia Plan: General   Post-op Pain Management:    Induction: Intravenous  PONV Risk Score and Plan: 3 and TIVA  Airway Management Planned: Nasal Cannula and Natural Airway  Additional Equipment:   Intra-op Plan:   Post-operative Plan:   Informed Consent: I have reviewed the patients History and Physical, chart, labs and discussed the procedure including the risks, benefits and alternatives for the proposed anesthesia with the patient or authorized representative who has indicated his/her  understanding and acceptance.     Dental Advisory Given  Plan Discussed with: CRNA  Anesthesia Plan Comments:         Anesthesia Quick Evaluation

## 2018-07-29 NOTE — Anesthesia Post-op Follow-up Note (Signed)
Anesthesia QCDR form completed.        

## 2018-07-29 NOTE — Anesthesia Postprocedure Evaluation (Signed)
Anesthesia Post Note  Patient: Jodi Harris  Procedure(s) Performed: COLONOSCOPY WITH PROPOFOL (N/A )  Patient location during evaluation: Endoscopy Anesthesia Type: General Level of consciousness: awake and alert Pain management: pain level controlled Vital Signs Assessment: post-procedure vital signs reviewed and stable Respiratory status: spontaneous breathing, nonlabored ventilation and respiratory function stable Cardiovascular status: blood pressure returned to baseline and stable Postop Assessment: no apparent nausea or vomiting Anesthetic complications: no     Last Vitals:  Vitals:   07/29/18 1346 07/29/18 1356  BP: (!) 110/57 (!) 114/49  Pulse:    Resp:    Temp:    SpO2:      Last Pain:  Vitals:   07/29/18 1416  TempSrc:   PainSc: 0-No pain                 Alphonsus Sias

## 2018-07-30 ENCOUNTER — Encounter: Payer: Self-pay | Admitting: Gastroenterology

## 2018-09-04 ENCOUNTER — Telehealth: Payer: Self-pay | Admitting: *Deleted

## 2018-09-04 DIAGNOSIS — E7800 Pure hypercholesterolemia, unspecified: Secondary | ICD-10-CM

## 2018-09-04 DIAGNOSIS — E039 Hypothyroidism, unspecified: Secondary | ICD-10-CM

## 2018-09-04 NOTE — Telephone Encounter (Signed)
Please place future orders for lab appt.  

## 2018-09-05 NOTE — Telephone Encounter (Signed)
Orders placed for labs

## 2018-09-06 ENCOUNTER — Other Ambulatory Visit: Payer: Self-pay

## 2018-09-06 ENCOUNTER — Other Ambulatory Visit: Payer: Medicare Other

## 2018-09-06 ENCOUNTER — Other Ambulatory Visit (INDEPENDENT_AMBULATORY_CARE_PROVIDER_SITE_OTHER): Payer: Medicare Other

## 2018-09-06 DIAGNOSIS — E7800 Pure hypercholesterolemia, unspecified: Secondary | ICD-10-CM

## 2018-09-06 DIAGNOSIS — E039 Hypothyroidism, unspecified: Secondary | ICD-10-CM | POA: Diagnosis not present

## 2018-09-06 LAB — BASIC METABOLIC PANEL
BUN: 17 mg/dL (ref 6–23)
CO2: 32 mEq/L (ref 19–32)
Calcium: 8.7 mg/dL (ref 8.4–10.5)
Chloride: 104 mEq/L (ref 96–112)
Creatinine, Ser: 0.72 mg/dL (ref 0.40–1.20)
GFR: 78.38 mL/min (ref >60.00)
Glucose, Bld: 84 mg/dL (ref 70–99)
Potassium: 4.2 mEq/L (ref 3.5–5.1)
Sodium: 141 mEq/L (ref 135–145)

## 2018-09-06 LAB — LIPID PANEL
Cholesterol: 139 mg/dL (ref 0–200)
HDL: 51.2 mg/dL (ref >39.00)
LDL Cholesterol: 75 mg/dL (ref 0–99)
NonHDL: 87.8
Total CHOL/HDL Ratio: 3
Triglycerides: 66 mg/dL (ref 0.0–149.0)
VLDL: 13.2 mg/dL (ref 0.0–40.0)

## 2018-09-06 LAB — TSH: TSH: 1.03 u[IU]/mL (ref 0.35–4.50)

## 2018-09-06 LAB — HEPATIC FUNCTION PANEL
ALT: 7 U/L (ref 0–35)
AST: 13 U/L (ref 0–37)
Albumin: 3.5 g/dL (ref 3.5–5.2)
Alkaline Phosphatase: 64 U/L (ref 39–117)
Bilirubin, Direct: 0.1 mg/dL (ref 0.0–0.3)
Total Bilirubin: 0.5 mg/dL (ref 0.2–1.2)
Total Protein: 5.7 g/dL — ABNORMAL LOW (ref 6.0–8.3)

## 2018-09-09 ENCOUNTER — Other Ambulatory Visit: Payer: Self-pay

## 2018-09-09 ENCOUNTER — Encounter: Payer: Self-pay | Admitting: Internal Medicine

## 2018-09-09 ENCOUNTER — Ambulatory Visit: Payer: Medicare Other | Admitting: Internal Medicine

## 2018-09-09 ENCOUNTER — Ambulatory Visit (INDEPENDENT_AMBULATORY_CARE_PROVIDER_SITE_OTHER): Payer: Medicare Other | Admitting: Internal Medicine

## 2018-09-09 DIAGNOSIS — F439 Reaction to severe stress, unspecified: Secondary | ICD-10-CM

## 2018-09-09 DIAGNOSIS — E7800 Pure hypercholesterolemia, unspecified: Secondary | ICD-10-CM

## 2018-09-09 DIAGNOSIS — K219 Gastro-esophageal reflux disease without esophagitis: Secondary | ICD-10-CM

## 2018-09-09 DIAGNOSIS — E559 Vitamin D deficiency, unspecified: Secondary | ICD-10-CM

## 2018-09-09 DIAGNOSIS — M25561 Pain in right knee: Secondary | ICD-10-CM

## 2018-09-09 DIAGNOSIS — E039 Hypothyroidism, unspecified: Secondary | ICD-10-CM

## 2018-09-09 MED ORDER — AZITHROMYCIN 250 MG PO TABS: ORAL_TABLET | ORAL | 0 refills | Status: DC

## 2018-09-09 NOTE — Patient Instructions (Signed)
pepcid 20mg - take one tablet 30 minutes before breakfast 

## 2018-09-09 NOTE — Progress Notes (Signed)
Patient ID: Jodi MaisSara Compton Harris, female   DOB: 10/06/1940, 78 y.o.   MRN: [id number]   Subjective:    Patient ID: Jodi Harris, female    DOB: 09/03/1940, 78 y.o.   MRN: [id number]  HPI  Patient here for a scheduled follow up.  States she is doing relatively well.  Still having problems with her right knee.  Limits her activity.  Discussed her weight gain, diet and exercise.  She previously was on phentermine.  Wants to remain on this medication - daily.  Discussed that this is not meant to be a long term medication.  Not watching her diet.  Not exercising.  Discussed the need for diet adjustment and exercise.  Discussed further treatment, but need to start in a routine with diet and exercise.  Discussed lab results.  On crestor.  No chest pain.  No sob.  Does report some acid reflux.  Discussed starting pepcid.  No abdominal pain. Bowels moving.     Past Medical History:  Diagnosis Date  . Anemia   . Diverticulosis   . GERD (gastroesophageal reflux disease)   . History of colonic polyps   . Hypercholesterolemia   . Hypothyroidism   . Internal hemorrhoids   . Thrombophlebitis    varicosities   Past Surgical History:  Procedure Laterality Date  . COLON SURGERY     laparoscopic assisted transverse colectomy for tubular adenomatous polyp  . COLONOSCOPY    . COLONOSCOPY WITH PROPOFOL N/A 07/29/2018   Procedure: COLONOSCOPY WITH PROPOFOL;  Surgeon: Christena DeemSkulskie, Martin U, MD;  [facility];  Service: Endoscopy;  Laterality: N/A;  . DIAGNOSTIC LAPAROSCOPY    . LAPAROSCOPIC GASTRIC BANDING    . URETHRAL DIVERTICULUM REPAIR  1974   Dr [name]   Family History  Problem Relation Age of Onset  . Cancer Father        lymphoma  . Cancer Maternal Aunt        Breast  . Breast cancer Maternal Aunt 50  . Colon cancer Other        one relative on maternal side   Social History   Socioeconomic History  . Marital status: Married    Spouse name: Not on file  . Number of  children: 2  . Years of education: Not on file  . Highest education level: Not on file  Occupational History  . Not on file  Social Needs  . Financial resource strain: Not hard at all  . Food insecurity    Worry: Never true    Inability: Never true  . Transportation needs    Medical: No    Non-medical: No  Tobacco Use  . Smoking status: Never Smoker  . Smokeless tobacco: Never Used  . Tobacco comment: smoked as a teenager  Substance and Sexual Activity  . Alcohol use: Yes    Alcohol/week: 0.0 standard drinks    Comment: has a glass of wine about once a month  . Drug use: No  . Sexual activity: Never  Lifestyle  . Physical activity    Days per week: Not on file    Minutes per session: Not on file  . Stress: Not on file  Relationships  . Social Musicianconnections    Talks on phone: Not on file    Gets together: Not on file    Attends religious service: Not on file    Active member of club or organization: Not on file    Attends meetings of clubs or organizations:  Not on file    Relationship status: Not on file  Other Topics Concern  . Not on file  Social History Narrative  . Not on file    Outpatient Encounter Medications as of 09/09/2018  Medication Sig  . calcium carbonate (TUMS EX) 750 MG chewable tablet Chew 1 tablet by mouth as needed.   . Cholecalciferol (VITAMIN D-3 PO) Take 2,000 Units by mouth daily.  Marland Kitchen esomeprazole (NEXIUM) 20 MG capsule Take 20 mg by mouth daily at 12 noon.  . fluticasone (FLONASE) 50 MCG/ACT nasal spray Place 2 sprays into both nostrils daily as needed.  . Naproxen Sodium (ALEVE PO) Take by mouth as needed.   . Probiotic Product (ALIGN PO) Take 1 capsule by mouth daily.  . rosuvastatin (CRESTOR) 5 MG tablet Take 1 tablet (5 mg total) by mouth daily.  Marland Kitchen SYNTHROID 137 MCG tablet Take 1 tablet (137 mcg total) by mouth daily before breakfast.  . azithromycin (ZITHROMAX) 250 MG tablet Take 2 tablets x 1 day and then one tablet per day for four more  days.  . [DISCONTINUED] FLUoxetine (PROZAC) 10 MG capsule Take 10 mg by mouth daily.  . [DISCONTINUED] mupirocin ointment (BACTROBAN) 2 % Apply to affected area bid. (Patient not taking: Reported on 09/09/2018)  . [DISCONTINUED] phentermine (ADIPEX-P) 37.5 MG tablet Take 0.5 tablets by mouth daily.   No facility-administered encounter medications on file as of 09/09/2018.     Review of Systems  Constitutional: Negative for appetite change and unexpected weight change.  HENT: Negative for congestion and sinus pressure.   Respiratory: Negative for cough, chest tightness and shortness of breath.   Cardiovascular: Negative for chest pain, palpitations and leg swelling.  Gastrointestinal: Negative for abdominal pain, diarrhea, nausea and vomiting.  Genitourinary: Negative for difficulty urinating and dysuria.  Musculoskeletal: Negative for joint swelling and myalgias.  Skin: Negative for color change and rash.  Neurological: Negative for dizziness, light-headedness and headaches.  Psychiatric/Behavioral: Negative for agitation and dysphoric mood.       Objective:    Physical Exam Constitutional:      Appearance: Normal appearance.  HENT:     Right Ear: External ear normal.     Left Ear: External ear normal.  Eyes:     General: No scleral icterus.       Right eye: No discharge.        Left eye: No discharge.     Conjunctiva/sclera: Conjunctivae normal.  Neck:     Musculoskeletal: Neck supple. No muscular tenderness.     Thyroid: No thyromegaly.  Cardiovascular:     Rate and Rhythm: Normal rate and regular rhythm.  Pulmonary:     Effort: No respiratory distress.     Breath sounds: Normal breath sounds. No wheezing.  Abdominal:     General: Bowel sounds are normal.     Palpations: Abdomen is soft.     Tenderness: There is no abdominal tenderness.  Musculoskeletal:        General: No swelling or tenderness.     Comments: Right knee pain as outlined.    Lymphadenopathy:      Cervical: No cervical adenopathy.  Skin:    Findings: No erythema or rash.  Neurological:     Mental Status: She is alert.  Psychiatric:        Mood and Affect: Mood normal.        Behavior: Behavior normal.     BP (!) 120/58 (BP Location: Left Arm, Patient Position: Sitting, Cuff  Size: Large)   Pulse 63   Temp (!) 97.4 F (36.3 C) (Oral)   Resp 16   Ht 5' 5.5" (1.664 m)   Wt 246 lb 3.2 oz (111.7 kg)   LMP 02/01/1993   SpO2 96%   BMI 40.35 kg/m  Wt Readings from Last 3 Encounters:  09/09/18 246 lb 3.2 oz (111.7 kg)  07/29/18 239 lb (108.4 kg)  04/30/18 236 lb (107 kg)     Lab Results  Component Value Date   WBC 4.8 10/18/2017   HGB 13.2 10/18/2017   HCT 40.7 10/18/2017   PLT 206.0 10/18/2017   GLUCOSE 84 09/06/2018   CHOL 139 09/06/2018   TRIG 66.0 09/06/2018   HDL 51.20 09/06/2018   LDLDIRECT 129.8 02/15/2012   LDLCALC 75 09/06/2018   ALT 7 09/06/2018   AST 13 09/06/2018   NA 141 09/06/2018   K 4.2 09/06/2018   CL 104 09/06/2018   CREATININE 0.72 09/06/2018   BUN 17 09/06/2018   CO2 32 09/06/2018   TSH 1.03 09/06/2018       Assessment & Plan:   Problem List Items Addressed This Visit    GERD (gastroesophageal reflux disease)    On nexium.  Add pepcid.  Follow symptoms.        Hypercholesterolemia    On crestor.  Low cholesterol diet and exercise.  Follow lipid panel and liver function tests.        Hypothyroidism    On thyroid replacement.  Follow tsh.        Right knee pain    Persistent knee pain.  S/p fall.  Limiting activity.  Request referral to ortho - Dr Martha Clan.        Relevant Orders   Ambulatory referral to Orthopedic Surgery   Severe obesity (BMI >= 40) (HCC)    Is s/p bariatric surgery.  Was on phentermine.  Discussed diet and exercise and need to get back in her routine.  Follow weight.        Stress    Overall handling things well. Follow.        Vitamin D deficiency    Follow vitamin  D level.             Dale Scappoose, MD

## 2018-09-15 ENCOUNTER — Encounter: Payer: Self-pay | Admitting: Internal Medicine

## 2018-09-15 NOTE — Assessment & Plan Note (Signed)
On thyroid replacement.  Follow tsh.  

## 2018-09-15 NOTE — Assessment & Plan Note (Signed)
On crestor.  Low cholesterol diet and exercise.  Follow lipid panel and liver function tests.   

## 2018-09-15 NOTE — Assessment & Plan Note (Signed)
Follow vitamin D level.  

## 2018-09-15 NOTE — Assessment & Plan Note (Signed)
Overall handling things well.  Follow.   

## 2018-09-15 NOTE — Assessment & Plan Note (Signed)
On nexium.  Add pepcid.  Follow symptoms.

## 2018-09-15 NOTE — Assessment & Plan Note (Signed)
Persistent knee pain.  S/p fall.  Limiting activity.  Request referral to ortho - Dr Mack Guise.

## 2018-09-15 NOTE — Assessment & Plan Note (Signed)
Is s/p bariatric surgery.  Was on phentermine.  Discussed diet and exercise and need to get back in her routine.  Follow weight.

## 2018-09-19 ENCOUNTER — Other Ambulatory Visit: Payer: Self-pay | Admitting: Internal Medicine

## 2018-10-31 ENCOUNTER — Other Ambulatory Visit: Payer: Self-pay | Admitting: Internal Medicine

## 2018-11-11 ENCOUNTER — Other Ambulatory Visit: Payer: Self-pay

## 2018-11-14 ENCOUNTER — Ambulatory Visit: Payer: Medicare Other | Admitting: Internal Medicine

## 2018-11-15 ENCOUNTER — Ambulatory Visit (INDEPENDENT_AMBULATORY_CARE_PROVIDER_SITE_OTHER): Payer: Medicare Other | Admitting: Internal Medicine

## 2018-11-15 ENCOUNTER — Other Ambulatory Visit: Payer: Self-pay

## 2018-11-15 DIAGNOSIS — E039 Hypothyroidism, unspecified: Secondary | ICD-10-CM

## 2018-11-15 DIAGNOSIS — K219 Gastro-esophageal reflux disease without esophagitis: Secondary | ICD-10-CM | POA: Diagnosis not present

## 2018-11-15 DIAGNOSIS — E7800 Pure hypercholesterolemia, unspecified: Secondary | ICD-10-CM | POA: Diagnosis not present

## 2018-11-15 DIAGNOSIS — F439 Reaction to severe stress, unspecified: Secondary | ICD-10-CM | POA: Diagnosis not present

## 2018-11-15 DIAGNOSIS — Z713 Dietary counseling and surveillance: Secondary | ICD-10-CM

## 2018-11-15 DIAGNOSIS — R42 Dizziness and giddiness: Secondary | ICD-10-CM

## 2018-11-15 NOTE — Progress Notes (Signed)
Patient ID: Jodi Harris, female   DOB: 01-Jan-1941, 78 y.o.   MRN: [id number]   Virtual Visit via video Note  This visit type was conducted due to national recommendations for restrictions regarding the COVID-19 pandemic (e.g. social distancing).  This format is felt to be most appropriate for this patient at this time.  All issues noted in this document were discussed and addressed.  No physical exam was performed (except for noted visual exam findings with Video Visits).   I connected with Shawntel Farnworth today by a video enabled telemedicine application and verified that I am speaking with the correct person using two identifiers. Location patient: home Location provider: work Persons participating in the virtual visit: patient, provider  I discussed the limitations, risks, security and privacy concerns of performing an evaluation and management service by vidoe and the availability of in person appointments.  The patient expressed understanding and agreed to proceed.   Reason for visit: follow up appt  HPI: Last visit - reported reflux.  Is on nexium.  Added pepcid.  Doing well.  No acid reflux.  Discussed diet and exercise.  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  After this, started having some dizziness. Dull headache.  Dizziness worsen with certain movements - rolling over, etc. No sinus symptoms.  Dizziness is better now.  She request neurology referral given the dizziness.  No chest pain.  No sob.  No abdominal pain.  Request referral to ophthalmology - for evaluation of her eyes.     ROS: See pertinent positives and negatives per HPI.  Past Medical History:  Diagnosis Date  . Anemia   . Diverticulosis   . GERD (gastroesophageal reflux disease)   . History of colonic polyps   . Hypercholesterolemia   . Hypothyroidism   . Internal hemorrhoids   . Thrombophlebitis    varicosities    Past Surgical History:  Procedure  Laterality Date  . COLON SURGERY     laparoscopic assisted transverse colectomy for tubular adenomatous polyp  . COLONOSCOPY    . COLONOSCOPY WITH PROPOFOL N/A 07/29/2018   Procedure: COLONOSCOPY WITH PROPOFOL;  Surgeon: Christena Deem, MD;  Location: [facility] ENDOSCOPY;  Service: Endoscopy;  Laterality: N/A;  . DIAGNOSTIC LAPAROSCOPY    . LAPAROSCOPIC GASTRIC BANDING    . URETHRAL DIVERTICULUM REPAIR  1974   Dr Jenne Pane    Family History  Problem Relation Age of Onset  . Cancer Father        lymphoma  . Cancer Maternal Aunt        Breast  . Breast cancer Maternal Aunt 50  . Colon cancer Other        one relative on maternal side    SOCIAL HX: reviewed.    Current Outpatient Medications:  .  azithromycin (ZITHROMAX) 250 MG tablet, Take 2 tablets x 1 day and then one tablet per day for four more days., Disp: 6 tablet, Rfl: 0 .  calcium carbonate (TUMS EX) 750 MG chewable tablet, Chew 1 tablet by mouth as needed. , Disp: , Rfl:  .  Cholecalciferol (VITAMIN D-3 PO), Take 2,000 Units by mouth daily., Disp: , Rfl:  .  esomeprazole (NEXIUM) 20 MG capsule, Take 20 mg by mouth daily at 12 noon., Disp: , Rfl:  .  fluticasone (FLONASE) 50 MCG/ACT nasal spray, Place 2 sprays into both nostrils daily as needed., Disp: 16 g, Rfl: 5 .  Naproxen Sodium (ALEVE PO),  Take by mouth as needed. , Disp: , Rfl:  .  Probiotic Product (ALIGN PO), Take 1 capsule by mouth daily., Disp: , Rfl:  .  rosuvastatin (CRESTOR) 5 MG tablet, Take 1 tablet (5 mg total) by mouth daily., Disp: 90 tablet, Rfl: 3 .  SYNTHROID 137 MCG tablet, Take 1 tablet (137 mcg total) by mouth daily before breakfast., Disp: 90 tablet, Rfl: 0  EXAM:  GENERAL: alert, oriented, appears well and in no acute distress  HEENT: atraumatic, conjunttiva clear, no obvious abnormalities on inspection of external nose and ears  NECK: normal movements of the head and neck  LUNGS: on inspection no signs of respiratory distress, breathing rate  appears normal, no obvious gross SOB, gasping or wheezing  CV: no obvious cyanosis  PSYCH/NEURO: pleasant and cooperative, no obvious depression or anxiety, speech and thought processing grossly intact  ASSESSMENT AND PLAN:  Discussed the following assessment and plan:  GERD (gastroesophageal reflux disease) No symptoms now on current regimen.  Follow.    Hypercholesterolemia On crestor.  Low cholesterol diet and exercise.  Follow lipid panel and liver function tests.    Hypothyroidism On thyroid replacement.  Follow tsh.   Stress Overall handling things well.  Follow.    Weight loss counseling, encounter for Discussed diet and exercise.  She wanted to continue phentermine.  Discussed other treatment options.  Follow.    Dizziness Dizziness as outlined.  Discussed vertigo.  Discussed ENT evaluation.  Pt request neurology referral.     I discussed the assessment and treatment plan with the patient. The patient was provided an opportunity to ask questions and all were answered. The patient agreed with the plan and demonstrated an understanding of the instructions.   The patient was advised to call back or seek an in-person evaluation if the symptoms worsen or if the condition fails to improve as anticipated.   Einar Pheasant, MD

## 2018-11-17 ENCOUNTER — Encounter: Payer: Self-pay | Admitting: Internal Medicine

## 2018-11-18 DIAGNOSIS — R42 Dizziness and giddiness: Secondary | ICD-10-CM | POA: Insufficient documentation

## 2018-11-18 NOTE — Assessment & Plan Note (Signed)
Discussed diet and exercise.  She wanted to continue phentermine.  Discussed other treatment options.  Follow.

## 2018-11-18 NOTE — Assessment & Plan Note (Signed)
Overall handling things well.  Follow.   

## 2018-11-18 NOTE — Assessment & Plan Note (Signed)
On crestor.  Low cholesterol diet and exercise.  Follow lipid panel and liver function tests.   

## 2018-11-18 NOTE — Assessment & Plan Note (Signed)
On thyroid replacement.  Follow tsh.  

## 2018-11-18 NOTE — Assessment & Plan Note (Signed)
No symptoms now on current regimen.  Follow.

## 2018-11-18 NOTE — Assessment & Plan Note (Signed)
Dizziness as outlined.  Discussed vertigo.  Discussed ENT evaluation.  Pt request neurology referral.

## 2018-12-24 ENCOUNTER — Other Ambulatory Visit: Payer: Self-pay | Admitting: Internal Medicine

## 2019-01-25 ENCOUNTER — Other Ambulatory Visit: Payer: Self-pay | Admitting: Internal Medicine

## 2019-01-30 DIAGNOSIS — M3501 Sicca syndrome with keratoconjunctivitis: Secondary | ICD-10-CM | POA: Diagnosis not present

## 2019-02-20 ENCOUNTER — Ambulatory Visit (INDEPENDENT_AMBULATORY_CARE_PROVIDER_SITE_OTHER): Payer: Medicare PPO | Admitting: Internal Medicine

## 2019-02-20 ENCOUNTER — Other Ambulatory Visit: Payer: Self-pay

## 2019-02-20 ENCOUNTER — Ambulatory Visit (INDEPENDENT_AMBULATORY_CARE_PROVIDER_SITE_OTHER): Payer: Medicare PPO

## 2019-02-20 ENCOUNTER — Encounter: Payer: Self-pay | Admitting: Internal Medicine

## 2019-02-20 VITALS — Ht 65.0 in | Wt 248.0 lb

## 2019-02-20 DIAGNOSIS — K219 Gastro-esophageal reflux disease without esophagitis: Secondary | ICD-10-CM | POA: Diagnosis not present

## 2019-02-20 DIAGNOSIS — E559 Vitamin D deficiency, unspecified: Secondary | ICD-10-CM

## 2019-02-20 DIAGNOSIS — F439 Reaction to severe stress, unspecified: Secondary | ICD-10-CM | POA: Diagnosis not present

## 2019-02-20 DIAGNOSIS — Z0000 Encounter for general adult medical examination without abnormal findings: Secondary | ICD-10-CM

## 2019-02-20 DIAGNOSIS — E7800 Pure hypercholesterolemia, unspecified: Secondary | ICD-10-CM

## 2019-02-20 DIAGNOSIS — E039 Hypothyroidism, unspecified: Secondary | ICD-10-CM

## 2019-02-20 DIAGNOSIS — R42 Dizziness and giddiness: Secondary | ICD-10-CM

## 2019-02-20 MED ORDER — ESOMEPRAZOLE MAGNESIUM 40 MG PO CPDR: 40.0000 mg | DELAYED_RELEASE_CAPSULE | Freq: Every day | ORAL | 2 refills | Status: DC

## 2019-02-20 NOTE — Progress Notes (Addendum)
Subjective:   Jodi Harris is a 78 y.o. female who presents for Medicare Annual (Subsequent) preventive examination.  Review of Systems:  No ROS.  Medicare Wellness Virtual Visit.  Visual/audio telehealth visit, UTA vital signs.   Wt/Ht provided.  See social history for additional risk factors.   Cardiac Risk Factors include: advanced age (>85men, >38 women)     Objective:     Vitals: Ht 5\' 5"  (1.651 m)   Wt 248 lb (112.5 kg)   LMP 02/01/1993   BMI 41.27 kg/m   Body mass index is 41.27 kg/m.  Advanced Directives 02/20/2019 07/29/2018 02/18/2018 02/15/2017 02/14/2016 01/29/2015  Does Patient Have a Medical Advance Directive? Yes Yes Yes Yes Yes Yes  Type of 01/31/2015 of [place];Living will Healthcare Power of [place];Living will Healthcare Power of [place];Living will Healthcare Power of [place];Living will Healthcare Power of [place];Living will Healthcare Power of [place];Living will  Does patient want to make changes to medical advance directive? No - Patient declined - No - Patient declined No - Patient declined No - Patient declined No - Patient declined  Copy of Healthcare Power of Attorney in Chart? No - copy requested No - copy requested No - copy requested No - copy requested No - copy requested No - copy requested    Tobacco Social History   Tobacco Use  Smoking Status Never Smoker  Smokeless Tobacco Never Used  Tobacco Comment   smoked as a teenager     Counseling given: Not Answered Comment: smoked as a teenager   Clinical Intake:  Pre-visit preparation completed: Yes        Diabetes: No  How often do you need to have someone help you when you read instructions, pamphlets, or other written materials from your doctor or pharmacy?: 1 - Never  Interpreter Needed?: No     Past Medical History:  Diagnosis Date  . Anemia   . Diverticulosis   . GERD (gastroesophageal reflux disease)   . History of colonic polyps     . Hypercholesterolemia   . Hypothyroidism   . Internal hemorrhoids   . Thrombophlebitis    varicosities   Past Surgical History:  Procedure Laterality Date  . COLON SURGERY     laparoscopic assisted transverse colectomy for tubular adenomatous polyp  . COLONOSCOPY    . COLONOSCOPY WITH PROPOFOL N/A 07/29/2018   Procedure: COLONOSCOPY WITH PROPOFOL;  Surgeon: 07/31/2018, MD;  Location: [facility] ENDOSCOPY;  Service: Endoscopy;  Laterality: N/A;  . DIAGNOSTIC LAPAROSCOPY    . LAPAROSCOPIC GASTRIC BANDING    . URETHRAL DIVERTICULUM REPAIR  1974   Dr [facility]   Family History  Problem Relation Age of Onset  . Cancer Father        lymphoma  . Cancer Maternal Aunt        Breast  . Breast cancer Maternal Aunt 50  . Colon cancer Other        one relative on maternal side   Social History   Socioeconomic History  . Marital status: Married    Spouse name: Not on file  . Number of children: 2  . Years of education: Not on file  . Highest education level: Not on file  Occupational History  . Not on file  Tobacco Use  . Smoking status: Never Smoker  . Smokeless tobacco: Never Used  . Tobacco comment: smoked as a teenager  Substance and Sexual Activity  . Alcohol use: Not Currently  Alcohol/week: 0.0 standard drinks    Comment: has a glass of wine about once a month  . Drug use: No  . Sexual activity: Never  Other Topics Concern  . Not on file  Social History Narrative  . Not on file   Social Determinants of Health   Financial Resource Strain: Low Risk   . Difficulty of Paying Living Expenses: Not hard at all  Food Insecurity: No Food Insecurity  . Worried About Charity fundraiser in the Last Year: Never true  . Ran Out of Food in the Last Year: Never true  Transportation Needs: No Transportation Needs  . Lack of Transportation (Medical): No  . Lack of Transportation (Non-Medical): No  Physical Activity:   . Days of Exercise per Week: Not on file  . Minutes of  Exercise per Session: Not on file  Stress: No Stress Concern Present  . Feeling of Stress : Not at all  Social Connections: Unknown  . Frequency of Communication with Friends and Family: More than three times a week  . Frequency of Social Gatherings with Friends and Family: More than three times a week  . Attends Religious Services: Not on file  . Active Member of Clubs or Organizations: Not on file  . Attends Archivist Meetings: Not on file  . Marital Status: Married    Outpatient Encounter Medications as of 02/20/2019  Medication Sig  . calcium carbonate (TUMS EX) 750 MG chewable tablet Chew 1 tablet by mouth as needed.   . Cholecalciferol (VITAMIN D-3 PO) Take 2,000 Units by mouth daily.  Marland Kitchen esomeprazole (NEXIUM) 20 MG capsule Take 20 mg by mouth daily at 12 noon.  . fluticasone (FLONASE) 50 MCG/ACT nasal spray Place 2 sprays into both nostrils daily as needed.  . Naproxen Sodium (ALEVE PO) Take by mouth as needed.   . Probiotic Product (ALIGN PO) Take 1 capsule by mouth daily.  . rosuvastatin (CRESTOR) 5 MG tablet Take 1 tablet (5 mg total) by mouth daily.  Marland Kitchen SYNTHROID 137 MCG tablet Take 1 tablet (137 mcg total) by mouth daily before breakfast.  . [DISCONTINUED] azithromycin (ZITHROMAX) 250 MG tablet Take 2 tablets x 1 day and then one tablet per day for four more days.   No facility-administered encounter medications on file as of 02/20/2019.    Activities of Daily Living In your present state of health, do you have any difficulty performing the following activities: 02/20/2019  Hearing? N  Vision? N  Difficulty concentrating or making decisions? N  Walking or climbing stairs? N  Dressing or bathing? N  Doing errands, shopping? N  Preparing Food and eating ? N  Using the Toilet? N  In the past six months, have you accidently leaked urine? N  Do you have problems with loss of bowel control? N  Managing your Medications? N  Managing your Finances? N  Some recent  data might be hidden    Patient Care Team: Einar Pheasant, MD as PCP - General (Internal Medicine)    Assessment:   This is a routine wellness examination for Jodi Harris.  Nurse connected with patient 02/20/19 at  1:30 PM EST by a telephone enabled telemedicine application and verified that I am speaking with the correct person using two identifiers. Patient stated full name and DOB. Patient gave permission to continue with virtual visit. Patient's location was at home and Nurse's location was at [place] office.   Patient is alert and oriented x3. Patient denies difficulty focusing  or concentrating. Patient likes to play computer games and keeps a calendar for brain stimulation.   Health Maintenance Due: See completed HM at the end of note.   Eye: Visual acuity not assessed. Virtual visit. Followed by their ophthalmologist.  Dental: Visits every 6 months.    Hearing: Demonstrates normal hearing during visit.  Safety:  Patient feels safe at home- yes Patient does have smoke detectors at home- yes Patient does wear sunscreen or protective clothing when in direct sunlight - yes Patient does wear seat belt when in a moving vehicle - yes Patient drives- yes Adequate lighting in walkways free from debris- yes Grab bars and handrails used as appropriate- yes Ambulates with an assistive device- no Cell phone on person when ambulating outside of the home- yes  Social: Alcohol intake - no      Smoking history- never   Smokers in home? none Illicit drug use? none  Medication: Taking as directed and without issues.  Self managed - yes   Covid-19: Precautions and sickness symptoms discussed. Wears mask, social distancing, hand hygiene as appropriate.   Activities of Daily Living Patient denies needing assistance with: household chores, feeding themselves, getting from bed to chair, getting to the toilet, bathing/showering, dressing, managing money, or preparing meals.   Discussed  the importance of a healthy diet, water intake and the benefits of aerobic exercise.  Physical activity- active around the home.  Diet:  Regular Water: good water intake Caffeine: equal amount to water  Other Providers Patient Care Team: Dale [place], MD as PCP - General (Internal Medicine)  Exercise Activities and Dietary recommendations Current Exercise Habits: Home exercise routine, Intensity: Mild  Goals      Patient Stated   . Increase physical activity (pt-stated)     Lose weight       Fall Risk Fall Risk  02/20/2019 02/18/2018 02/15/2017 02/14/2016 12/14/2015  Falls in the past year? 0 0 No No No  Number falls in past yr: - - - - -  Injury with Fall? - - - - -  Risk for fall due to : - - - - -  Follow up Falls evaluation completed - - - -   Timed Get Up and Go performed: no, virtual visit  Depression Screen PHQ 2/9 Scores 02/20/2019 02/18/2018 02/15/2017 02/14/2016  PHQ - 2 Score 0 0 0 0     Cognitive Function MMSE - Mini Mental State Exam 02/15/2017 01/29/2015  Orientation to time 5 5  Orientation to Place 5 5  Registration 3 3  Attention/ Calculation 5 5  Recall 3 3  Language- name 2 objects 2 2  Language- repeat 1 1  Language- follow 3 step command 3 3  Language- read & follow direction 1 1  Write a sentence 1 1  Copy design 1 1  Total score 30 30     6CIT Screen 02/20/2019 02/18/2018 02/14/2016  What Year? 0 points 0 points 0 points  What month? 0 points 0 points 0 points  What time? 0 points 0 points 0 points  Count back from 20 0 points 0 points 0 points  Months in reverse 0 points 0 points 0 points  Repeat phrase 0 points 0 points 0 points  Total Score 0 0 0    Immunization History  Administered Date(s) Administered  . Influenza Split 12/03/2011, 11/12/2012, 11/13/2013  . Influenza, High Dose Seasonal PF 11/23/2017  . Influenza,inj,quad, With Preservative 11/17/2016, 10/23/2018  . Influenza-Unspecified 11/14/2014, 11/04/2015  . Moderna  SARS-COVID-2  Vaccination 01/27/2019, 02/18/2019  . Pneumococcal Conjugate-13 04/24/2013  . Pneumococcal Polysaccharide-23 02/02/2012  . Tdap 03/10/2010  . Zoster 07/01/2008  . Zoster Recombinat (Shingrix) 12/14/2017, 03/13/2018, 06/20/2018   Screening Tests Health Maintenance  Topic Date Due  . MAMMOGRAM  07/10/2019  . TETANUS/TDAP  03/09/2020  . COLONOSCOPY  07/29/2023  . INFLUENZA VACCINE  Completed  . DEXA SCAN  Completed  . PNA vac Low Risk Adult  Completed      Plan:   Keep all routine maintenance appointments.   Follow up with your doctor today @ 2:00  Medicare Attestation I have personally reviewed: The patient's medical and social history Their use of alcohol, tobacco or illicit drugs Their current medications and supplements The patient's functional ability including ADLs,fall risks, home safety risks, cognitive, and hearing and visual impairment Diet and physical activities Evidence for depression   I have reviewed and discussed with patient certain preventive protocols, quality metrics, and best practice recommendations.      Ashok Pall, LPN  5/45/[id number]   Reviewed above information.  Agree with assessment and plan.    Dr Lorin Picket

## 2019-02-20 NOTE — Patient Instructions (Addendum)
  Ms. Eichholz , Thank you for taking time to come for your Medicare Wellness Visit. I appreciate your ongoing commitment to your health goals. Please review the following plan we discussed and let me know if I can assist you in the future.   These are the goals we discussed: Goals      Patient Stated   . Increase physical activity (pt-stated)     Lose weight       This is a list of the screening recommended for you and due dates:  Health Maintenance  Topic Date Due  . Mammogram  07/10/2019  . Tetanus Vaccine  03/09/2020  . Colon Cancer Screening  07/29/2023  . Flu Shot  Completed  . DEXA scan (bone density measurement)  Completed  . Pneumonia vaccines  Completed

## 2019-02-20 NOTE — Progress Notes (Signed)
Patient ID: Jodi Harris, female   DOB: 01-02-1941, 79 y.o.   MRN: [id number]   Virtual Visit via video Note  This visit type was conducted due to national recommendations for restrictions regarding the COVID-19 pandemic (e.g. social distancing).  This format is felt to be most appropriate for this patient at this time.  All issues noted in this document were discussed and addressed.  No physical exam was performed (except for noted visual exam findings with Video Visits).   I connected with Jodi Harris by a video enabled telemedicine application and verified that I am speaking with the correct person using two identifiers. Location patient: home Location provider: work or home office Persons participating in the virtual visit: patient, provider  The limitations, risks, security and privacy concerns of performing an evaluation and management service by video and the availability of in person appointments have been discussed.  The patient expressed understanding and agreed to proceed.   Reason for visit: scheduled follow up.   HPI: She reports she is doing relatively well.  Discussed coffee may aggravate acid reflux.  Takes TUMS.  Discussed taking nexium daily.  Discussed diet and exercise.  No chest pain.  Breathing stable.  No abdominal pain or bowel change reported.  Handling stress.     ROS: See pertinent positives and negatives per HPI.  Past Medical History:  Diagnosis Date  . Anemia   . Diverticulosis   . GERD (gastroesophageal reflux disease)   . History of colonic polyps   . Hypercholesterolemia   . Hypothyroidism   . Internal hemorrhoids   . Thrombophlebitis    varicosities    Past Surgical History:  Procedure Laterality Date  . COLON SURGERY     laparoscopic assisted transverse colectomy for tubular adenomatous polyp  . COLONOSCOPY    . COLONOSCOPY WITH PROPOFOL N/A 07/29/2018   Procedure: COLONOSCOPY WITH PROPOFOL;  Surgeon: Lollie Sails, MD;  Location:  [facility] ENDOSCOPY;  Service: Endoscopy;  Laterality: N/A;  . DIAGNOSTIC LAPAROSCOPY    . LAPAROSCOPIC GASTRIC BANDING    . URETHRAL DIVERTICULUM REPAIR  1974   Dr Redmond Baseman    Family History  Problem Relation Age of Onset  . Cancer Father        lymphoma  . Cancer Maternal Aunt        Breast  . Breast cancer Maternal Aunt 42  . Colon cancer Other        one relative on maternal side    SOCIAL HX: reviewed.    Current Outpatient Medications:  .  calcium carbonate (TUMS EX) 750 MG chewable tablet, Chew 1 tablet by mouth as needed. , Disp: , Rfl:  .  Cholecalciferol (VITAMIN D-3 PO), Take 2,000 Units by mouth daily., Disp: , Rfl:  .  esomeprazole (NEXIUM) 40 MG capsule, Take 1 capsule (40 mg total) by mouth daily., Disp: 30 capsule, Rfl: 2 .  fluticasone (FLONASE) 50 MCG/ACT nasal spray, Place 2 sprays into both nostrils daily as needed., Disp: 16 g, Rfl: 5 .  Naproxen Sodium (ALEVE PO), Take by mouth as needed. , Disp: , Rfl:  .  Probiotic Product (ALIGN PO), Take 1 capsule by mouth daily., Disp: , Rfl:  .  rosuvastatin (CRESTOR) 5 MG tablet, Take 1 tablet (5 mg total) by mouth daily., Disp: 90 tablet, Rfl: 0 .  SYNTHROID 137 MCG tablet, Take 1 tablet (137 mcg total) by mouth daily before breakfast., Disp: 90 tablet, Rfl: 1  EXAM:  GENERAL: alert, oriented, appears  well and in no acute distress  HEENT: atraumatic, conjunttiva clear, no obvious abnormalities on inspection of external nose and ears  NECK: normal movements of the head and neck  LUNGS: on inspection no signs of respiratory distress, breathing rate appears normal, no obvious gross SOB, gasping or wheezing  CV: no obvious cyanosis  PSYCH/NEURO: pleasant and cooperative, no obvious depression or anxiety, speech and thought processing grossly intact  ASSESSMENT AND PLAN:  Discussed the following assessment and plan:  Dizziness No significant dizziness currently.  Has noticed dizziness with bending.  Follow.    GERD  (gastroesophageal reflux disease) Discussed taking nexium regularly. Follow.    Hypercholesterolemia On crestor.  Low cholesterol diet and exercise.  Follow lipid panel and liver function tests.    Hypothyroidism On thyroid replacement.  Follow tsh.   Stress Handling stress.  Follow.    Vitamin D deficiency Follow vitamin D level.     Meds ordered this encounter  Medications  . esomeprazole (NEXIUM) 40 MG capsule    Sig: Take 1 capsule (40 mg total) by mouth daily.    Dispense:  30 capsule    Refill:  2     I discussed the assessment and treatment plan with the patient. The patient was provided an opportunity to ask questions and all were answered. The patient agreed with the plan and demonstrated an understanding of the instructions.   The patient was advised to call back or seek an in-person evaluation if the symptoms worsen or if the condition fails to improve as anticipated.   Dale Allison, MD

## 2019-02-23 ENCOUNTER — Encounter: Payer: Self-pay | Admitting: Internal Medicine

## 2019-02-23 NOTE — Assessment & Plan Note (Signed)
Follow vitamin D level.  

## 2019-02-23 NOTE — Assessment & Plan Note (Signed)
Discussed taking nexium regularly. Follow.

## 2019-02-23 NOTE — Assessment & Plan Note (Signed)
No significant dizziness currently.  Has noticed dizziness with bending.  Follow.

## 2019-02-23 NOTE — Assessment & Plan Note (Signed)
On crestor.  Low cholesterol diet and exercise.  Follow lipid panel and liver function tests.   

## 2019-02-23 NOTE — Assessment & Plan Note (Signed)
On thyroid replacement.  Follow tsh.  

## 2019-02-23 NOTE — Assessment & Plan Note (Signed)
Handling stress.  Follow.   

## 2019-02-25 ENCOUNTER — Other Ambulatory Visit: Payer: Self-pay

## 2019-02-26 ENCOUNTER — Other Ambulatory Visit: Payer: Self-pay

## 2019-02-27 ENCOUNTER — Other Ambulatory Visit: Payer: Self-pay

## 2019-03-03 ENCOUNTER — Other Ambulatory Visit: Payer: Self-pay

## 2019-03-03 ENCOUNTER — Other Ambulatory Visit (INDEPENDENT_AMBULATORY_CARE_PROVIDER_SITE_OTHER): Payer: Medicare PPO

## 2019-03-03 DIAGNOSIS — E7800 Pure hypercholesterolemia, unspecified: Secondary | ICD-10-CM | POA: Diagnosis not present

## 2019-03-03 LAB — LIPID PANEL
Cholesterol: 152 mg/dL (ref 0–200)
HDL: 57.2 mg/dL (ref >39.00)
LDL Cholesterol: 82 mg/dL (ref 0–99)
NonHDL: 95.2
Total CHOL/HDL Ratio: 3
Triglycerides: 67 mg/dL (ref 0.0–149.0)
VLDL: 13.4 mg/dL (ref 0.0–40.0)

## 2019-03-03 LAB — BASIC METABOLIC PANEL
BUN: 19 mg/dL (ref 6–23)
CO2: 32 mEq/L (ref 19–32)
Calcium: 9.7 mg/dL (ref 8.4–10.5)
Chloride: 103 mEq/L (ref 96–112)
Creatinine, Ser: 0.87 mg/dL (ref 0.40–1.20)
GFR: 62.92 mL/min (ref >60.00)
Glucose, Bld: 101 mg/dL — ABNORMAL HIGH (ref 70–99)
Potassium: 4.1 mEq/L (ref 3.5–5.1)
Sodium: 140 mEq/L (ref 135–145)

## 2019-03-03 LAB — CBC WITH DIFFERENTIAL/PLATELET
Basophils Absolute: 0 10*3/uL (ref 0.0–0.1)
Basophils Relative: 1 % (ref 0.0–3.0)
Eosinophils Absolute: 0.1 10*3/uL (ref 0.0–0.7)
Eosinophils Relative: 2.7 % (ref 0.0–5.0)
HCT: 40.2 % (ref 36.0–46.0)
Hemoglobin: 12.9 g/dL (ref 12.0–15.0)
Lymphocytes Relative: 32.9 % (ref 12.0–46.0)
Lymphs Abs: 1.4 10*3/uL (ref 0.7–4.0)
MCHC: 32.1 g/dL (ref 30.0–36.0)
MCV: 89.6 fl (ref 78.0–100.0)
Monocytes Absolute: 0.4 10*3/uL (ref 0.1–1.0)
Monocytes Relative: 8.9 % (ref 3.0–12.0)
Neutro Abs: 2.3 10*3/uL (ref 1.4–7.7)
Neutrophils Relative %: 54.5 % (ref 43.0–77.0)
Platelets: 218 10*3/uL (ref 150.0–400.0)
RBC: 4.49 Mil/uL (ref 3.87–5.11)
RDW: 14.4 % (ref 11.5–15.5)
WBC: 4.3 10*3/uL (ref 4.0–10.5)

## 2019-03-03 LAB — HEPATIC FUNCTION PANEL
ALT: 9 U/L (ref 0–35)
AST: 14 U/L (ref 0–37)
Albumin: 3.8 g/dL (ref 3.5–5.2)
Alkaline Phosphatase: 68 U/L (ref 39–117)
Bilirubin, Direct: 0.1 mg/dL (ref 0.0–0.3)
Total Bilirubin: 0.6 mg/dL (ref 0.2–1.2)
Total Protein: 6.6 g/dL (ref 6.0–8.3)

## 2019-03-04 ENCOUNTER — Encounter: Payer: Self-pay | Admitting: Internal Medicine

## 2019-04-25 ENCOUNTER — Ambulatory Visit: Payer: Self-pay | Admitting: Internal Medicine

## 2019-05-15 ENCOUNTER — Encounter: Payer: Self-pay | Admitting: Internal Medicine

## 2019-05-15 ENCOUNTER — Telehealth (INDEPENDENT_AMBULATORY_CARE_PROVIDER_SITE_OTHER): Payer: Medicare PPO | Admitting: Internal Medicine

## 2019-05-15 ENCOUNTER — Other Ambulatory Visit: Payer: Self-pay

## 2019-05-15 VITALS — Ht 65.0 in | Wt 255.0 lb

## 2019-05-15 DIAGNOSIS — E7800 Pure hypercholesterolemia, unspecified: Secondary | ICD-10-CM

## 2019-05-15 DIAGNOSIS — F439 Reaction to severe stress, unspecified: Secondary | ICD-10-CM

## 2019-05-15 DIAGNOSIS — Z1231 Encounter for screening mammogram for malignant neoplasm of breast: Secondary | ICD-10-CM

## 2019-05-15 DIAGNOSIS — Z713 Dietary counseling and surveillance: Secondary | ICD-10-CM

## 2019-05-15 DIAGNOSIS — Z86010 Personal history of colonic polyps: Secondary | ICD-10-CM

## 2019-05-15 DIAGNOSIS — K219 Gastro-esophageal reflux disease without esophagitis: Secondary | ICD-10-CM | POA: Diagnosis not present

## 2019-05-15 DIAGNOSIS — E039 Hypothyroidism, unspecified: Secondary | ICD-10-CM | POA: Diagnosis not present

## 2019-05-15 MED ORDER — LEVOTHYROXINE SODIUM 137 MCG PO TABS: ORAL_TABLET | ORAL | 1 refills | Status: DC

## 2019-05-15 MED ORDER — FLUOXETINE HCL 10 MG PO TABS: 10.0000 mg | ORAL_TABLET | Freq: Every day | ORAL | 2 refills | Status: DC

## 2019-05-15 NOTE — Progress Notes (Signed)
Patient ID: Jodi Harris, female   DOB: [birth year], 79 y.o.   MRN: [id number]   Virtual Visit via video Note  This visit type was conducted due to national recommendations for restrictions regarding the COVID-19 pandemic (e.g. social distancing).  This format is felt to be most appropriate for this patient at this time.  All issues noted in this document were discussed and addressed.  No physical exam was performed (except for noted visual exam findings with Video Visits).   I connected with Jodi Harris by a video enabled telemedicine application and verified that I am speaking with the correct person using two identifiers. Location patient: home Location provider: work  Persons participating in the virtual visit: patient, provider  The limitations, risks, security and privacy concerns of performing an evaluation and management service by video and the availability of in person appointments have been discussed.  It has also been discussed with the patient that there may be a patient responsible charge related to this service. The patient has expressed understanding and has agreed to proceed.   Reason for visit: scheduled follow up.    HPI: appt scheduled today to follow up regarding hypercholesterolemia and hypothyroidism.  She reports she is doing relatively well.  Not exercising as much.  Discussed diet and exercise.  Concern regarding weight gain.  No chest pain or sob reported.  No abdominal pain or bowel change reported.  On prozac.  Appears to be handling stress.  Had colonoscopy 07/2018 - redundant colon, diverticulosis.  Recommended f/u in 5 years.     ROS: See pertinent positives and negatives per HPI.  Past Medical History:  Diagnosis Date  . Anemia   . Diverticulosis   . GERD (gastroesophageal reflux disease)   . History of colonic polyps   . Hypercholesterolemia   . Hypothyroidism   . Internal hemorrhoids   . Thrombophlebitis    varicosities    Past Surgical  History:  Procedure Laterality Date  . COLON SURGERY     laparoscopic assisted transverse colectomy for tubular adenomatous polyp  . COLONOSCOPY    . COLONOSCOPY WITH PROPOFOL N/A 07/29/2018   Procedure: COLONOSCOPY WITH PROPOFOL;  Surgeon: Lollie Sails, MD;  Location: [facility] ENDOSCOPY;  Service: Endoscopy;  Laterality: N/A;  . DIAGNOSTIC LAPAROSCOPY    . LAPAROSCOPIC GASTRIC BANDING    . URETHRAL DIVERTICULUM REPAIR  1974   Dr Redmond Baseman    Family History  Problem Relation Age of Onset  . Cancer Father        lymphoma  . Cancer Maternal Aunt        Breast  . Breast cancer Maternal Aunt 7  . Colon cancer Other        one relative on maternal side    SOCIAL HX: reviewed.    Current Outpatient Medications:  .  calcium carbonate (TUMS EX) 750 MG chewable tablet, Chew 1 tablet by mouth as needed. , Disp: , Rfl:  .  Cholecalciferol (VITAMIN D-3 PO), Take 2,000 Units by mouth daily., Disp: , Rfl:  .  esomeprazole (NEXIUM) 40 MG capsule, Take 1 capsule (40 mg total) by mouth daily., Disp: 30 capsule, Rfl: 2 .  FLUoxetine (PROZAC) 10 MG tablet, Take 1 tablet (10 mg total) by mouth daily., Disp: 30 tablet, Rfl: 2 .  fluticasone (FLONASE) 50 MCG/ACT nasal spray, Place 2 sprays into both nostrils daily as needed., Disp: 16 g, Rfl: 5 .  levothyroxine (SYNTHROID) 137 MCG tablet, Take 1 tablet (137 mcg total) by  mouth daily before breakfast., Disp: 90 tablet, Rfl: 1 .  Naproxen Sodium (ALEVE PO), Take by mouth as needed. , Disp: , Rfl:  .  Probiotic Product (ALIGN PO), Take 1 capsule by mouth daily., Disp: , Rfl:  .  rosuvastatin (CRESTOR) 5 MG tablet, Take 1 tablet (5 mg total) by mouth daily., Disp: 90 tablet, Rfl: 0  EXAM:  VITALS per patient if applicable: weight 255 pounds.   GENERAL: alert, oriented, appears well and in no acute distress  HEENT: atraumatic, conjunttiva clear, no obvious abnormalities on inspection of external nose and ears  NECK: normal movements of the head and  neck  LUNGS: on inspection no signs of respiratory distress, breathing rate appears normal, no obvious gross SOB, gasping or wheezing  CV: no obvious cyanosis  PSYCH/NEURO: pleasant and cooperative, no obvious depression or anxiety, speech and thought processing grossly intact  ASSESSMENT AND PLAN:  Discussed the following assessment and plan:  Weight loss counseling, encounter for Discussed diet and exercise.  Off phentermine.  Discussed other treatment options.    Stress Overall appears to be handling things well.  Continue prozac.    Hypothyroidism On thyroid replacement.  Follow tsh.   Hypercholesterolemia On crestor.  Low cholesterol diet and exercise.  Follow lipid panel and liver function tests.   History of colon polyps Had colonoscopy 07/2018 - redundant colon and diverticulosis.  F/u recommended in 5 years.    GERD (gastroesophageal reflux disease) Upper symptoms controlled on nexium.  Follow.    Orders Placed This Encounter  Procedures  . MM 3D SCREEN BREAST BILATERAL    Standing Status:   Future    Standing Expiration Date:   07/14/2020    Order Specific Question:   Reason for Exam (SYMPTOM  OR DIAGNOSIS REQUIRED)    Answer:   [id number] MAMMO    Order Specific Question:   Preferred imaging location?    Answer:   [facility]  . TSH    Standing Status:   Future    Standing Expiration Date:   05/24/2020  . Lipid panel    Standing Status:   Future    Standing Expiration Date:   05/24/2020  . Hepatic function panel    Standing Status:   Future    Standing Expiration Date:   05/24/2020  . Basic metabolic panel    Standing Status:   Future    Standing Expiration Date:   05/24/2020    Meds ordered this encounter  Medications  . FLUoxetine (PROZAC) 10 MG tablet    Sig: Take 1 tablet (10 mg total) by mouth daily.    Dispense:  30 tablet    Refill:  2  . levothyroxine (SYNTHROID) 137 MCG tablet    Sig: Take 1 tablet (137 mcg total) by mouth daily before  breakfast.    Dispense:  90 tablet    Refill:  1     I discussed the assessment and treatment plan with the patient. The patient was provided an opportunity to ask questions and all were answered. The patient agreed with the plan and demonstrated an understanding of the instructions.   The patient was advised to call back or seek an in-person evaluation if the symptoms worsen or if the condition fails to improve as anticipated.    Dale [place], MD

## 2019-05-25 ENCOUNTER — Encounter: Payer: Self-pay | Admitting: Internal Medicine

## 2019-05-25 NOTE — Assessment & Plan Note (Signed)
On thyroid replacement.  Follow tsh.  

## 2019-05-25 NOTE — Assessment & Plan Note (Signed)
Upper symptoms controlled on nexium.  Follow.   

## 2019-05-25 NOTE — Assessment & Plan Note (Signed)
On crestor.  Low cholesterol diet and exercise.  Follow lipid panel and liver function tests.   

## 2019-05-25 NOTE — Assessment & Plan Note (Signed)
Had colonoscopy 07/2018 - redundant colon and diverticulosis.  F/u recommended in 5 years.   

## 2019-05-25 NOTE — Assessment & Plan Note (Signed)
Discussed diet and exercise.  Off phentermine.  Discussed other treatment options.

## 2019-05-25 NOTE — Assessment & Plan Note (Signed)
Overall appears to be handling things well.  Continue prozac.

## 2019-08-14 ENCOUNTER — Ambulatory Visit
Admission: RE | Admit: 2019-08-14 | Discharge: 2019-08-14 | Disposition: A | Discharge status: Home / Self Care | Payer: Medicare PPO | Source: Ambulatory Visit | Attending: Internal Medicine | Admitting: Internal Medicine

## 2019-08-14 ENCOUNTER — Other Ambulatory Visit: Payer: Self-pay

## 2019-08-14 DIAGNOSIS — Z1231 Encounter for screening mammogram for malignant neoplasm of breast: Secondary | ICD-10-CM | POA: Insufficient documentation

## 2019-09-09 DIAGNOSIS — Z5181 Encounter for therapeutic drug level monitoring: Secondary | ICD-10-CM | POA: Diagnosis not present

## 2019-09-09 DIAGNOSIS — Z6841 Body Mass Index (BMI) 40.0 and over, adult: Secondary | ICD-10-CM | POA: Diagnosis not present

## 2019-09-09 DIAGNOSIS — R635 Abnormal weight gain: Secondary | ICD-10-CM | POA: Diagnosis not present

## 2019-09-09 DIAGNOSIS — Z724 Inappropriate diet and eating habits: Secondary | ICD-10-CM | POA: Diagnosis not present

## 2019-09-09 DIAGNOSIS — Z9884 Bariatric surgery status: Secondary | ICD-10-CM | POA: Diagnosis not present

## 2019-11-27 ENCOUNTER — Other Ambulatory Visit: Payer: Self-pay | Admitting: Internal Medicine

## 2019-12-02 DIAGNOSIS — H18593 Other hereditary corneal dystrophies, bilateral: Secondary | ICD-10-CM | POA: Diagnosis not present

## 2019-12-02 DIAGNOSIS — H26493 Other secondary cataract, bilateral: Secondary | ICD-10-CM | POA: Diagnosis not present

## 2019-12-25 ENCOUNTER — Other Ambulatory Visit: Payer: Self-pay

## 2019-12-25 ENCOUNTER — Other Ambulatory Visit (INDEPENDENT_AMBULATORY_CARE_PROVIDER_SITE_OTHER): Payer: Medicare PPO

## 2019-12-25 DIAGNOSIS — E039 Hypothyroidism, unspecified: Secondary | ICD-10-CM

## 2019-12-25 DIAGNOSIS — E7800 Pure hypercholesterolemia, unspecified: Secondary | ICD-10-CM | POA: Diagnosis not present

## 2019-12-25 LAB — BASIC METABOLIC PANEL
BUN: 13 mg/dL (ref 6–23)
CO2: 32 mEq/L (ref 19–32)
Calcium: 8.9 mg/dL (ref 8.4–10.5)
Chloride: 104 mEq/L (ref 96–112)
Creatinine, Ser: 0.74 mg/dL (ref 0.40–1.20)
GFR: 77.12 mL/min (ref >60.00)
Glucose, Bld: 89 mg/dL (ref 70–99)
Potassium: 4.1 mEq/L (ref 3.5–5.1)
Sodium: 140 mEq/L (ref 135–145)

## 2019-12-25 LAB — LIPID PANEL
Cholesterol: 131 mg/dL (ref 0–200)
HDL: 50.3 mg/dL (ref >39.00)
LDL Cholesterol: 66 mg/dL (ref 0–99)
NonHDL: 80.34
Total CHOL/HDL Ratio: 3
Triglycerides: 71 mg/dL (ref 0.0–149.0)
VLDL: 14.2 mg/dL (ref 0.0–40.0)

## 2019-12-25 LAB — HEPATIC FUNCTION PANEL
ALT: 9 U/L (ref 0–35)
AST: 15 U/L (ref 0–37)
Albumin: 3.6 g/dL (ref 3.5–5.2)
Alkaline Phosphatase: 67 U/L (ref 39–117)
Bilirubin, Direct: 0.1 mg/dL (ref 0.0–0.3)
Total Bilirubin: 0.6 mg/dL (ref 0.2–1.2)
Total Protein: 6.1 g/dL (ref 6.0–8.3)

## 2019-12-25 LAB — TSH: TSH: 0.55 u[IU]/mL (ref 0.35–4.50)

## 2019-12-29 ENCOUNTER — Ambulatory Visit (INDEPENDENT_AMBULATORY_CARE_PROVIDER_SITE_OTHER): Payer: Medicare PPO | Admitting: Internal Medicine

## 2019-12-29 ENCOUNTER — Other Ambulatory Visit: Payer: Self-pay

## 2019-12-29 ENCOUNTER — Ambulatory Visit (INDEPENDENT_AMBULATORY_CARE_PROVIDER_SITE_OTHER): Payer: Medicare PPO

## 2019-12-29 VITALS — BP 120/64 | HR 67 | Temp 98.2°F | Resp 16 | Ht 65.0 in | Wt 266.0 lb

## 2019-12-29 DIAGNOSIS — M545 Low back pain, unspecified: Secondary | ICD-10-CM

## 2019-12-29 DIAGNOSIS — M25551 Pain in right hip: Secondary | ICD-10-CM

## 2019-12-29 DIAGNOSIS — E559 Vitamin D deficiency, unspecified: Secondary | ICD-10-CM

## 2019-12-29 DIAGNOSIS — M48061 Spinal stenosis, lumbar region without neurogenic claudication: Secondary | ICD-10-CM | POA: Diagnosis not present

## 2019-12-29 DIAGNOSIS — M47816 Spondylosis without myelopathy or radiculopathy, lumbar region: Secondary | ICD-10-CM | POA: Diagnosis not present

## 2019-12-29 DIAGNOSIS — E039 Hypothyroidism, unspecified: Secondary | ICD-10-CM

## 2019-12-29 DIAGNOSIS — M5136 Other intervertebral disc degeneration, lumbar region: Secondary | ICD-10-CM | POA: Diagnosis not present

## 2019-12-29 DIAGNOSIS — Z0000 Encounter for general adult medical examination without abnormal findings: Secondary | ICD-10-CM | POA: Diagnosis not present

## 2019-12-29 DIAGNOSIS — F439 Reaction to severe stress, unspecified: Secondary | ICD-10-CM

## 2019-12-29 DIAGNOSIS — M79671 Pain in right foot: Secondary | ICD-10-CM

## 2019-12-29 DIAGNOSIS — K219 Gastro-esophageal reflux disease without esophagitis: Secondary | ICD-10-CM

## 2019-12-29 DIAGNOSIS — M1611 Unilateral primary osteoarthritis, right hip: Secondary | ICD-10-CM | POA: Diagnosis not present

## 2019-12-29 DIAGNOSIS — E7800 Pure hypercholesterolemia, unspecified: Secondary | ICD-10-CM | POA: Diagnosis not present

## 2019-12-29 DIAGNOSIS — M549 Dorsalgia, unspecified: Secondary | ICD-10-CM | POA: Insufficient documentation

## 2019-12-29 MED ORDER — FLUTICASONE PROPIONATE 50 MCG/ACT NA SUSP: 2.0000 | Freq: Every day | NASAL | 5 refills | Status: DC | PRN

## 2019-12-29 MED ORDER — ROSUVASTATIN CALCIUM 5 MG PO TABS: 5.0000 mg | ORAL_TABLET | Freq: Every day | ORAL | 2 refills | Status: DC

## 2019-12-29 MED ORDER — LEVOTHYROXINE SODIUM 137 MCG PO TABS: ORAL_TABLET | ORAL | 1 refills | Status: DC

## 2019-12-29 NOTE — Assessment & Plan Note (Signed)
Physical today 12/29/19.  Mammogram 08/15/19 - Birads I.  Colonoscopy 07/2018.  Recommended f/u in 5 years.

## 2019-12-29 NOTE — Progress Notes (Signed)
Patient ID: Jodi Harris, female   DOB: 1940-01-30, 79 y.o.   MRN: [id number]   Subjective:    Patient ID: Jodi Harris, female    DOB: 12-Nov-1940, 79 y.o.   MRN: [id number]  HPI This visit occurred during the SARS-CoV-2 public health emergency.  Safety protocols were in place, including screening questions prior to the visit, additional usage of staff PPE, and extensive cleaning of exam room while observing appropriate contact time as indicated for disinfecting solutions.  Patient here for her physical exam.  She reports she is doing relatively well.  Does stretches in am.  Moving more.  Some persistent right hip discomfort.  2019 - fall.  Discomfort worse when first up.  Better as up and moving more.  Right heel pain.  No chest pain or sob reported.  No abdominal pain or bowel change reported.  Had colonoscopy 07/2018.  Recommended f/u colonoscopy in 5 years.  S/p Lap Band in 2006.  F/u at bariatric clinic 8/31.  Interested in weight loss medication.  Previously on phentermine.  Discussed treatment options.     Past Medical History:  Diagnosis Date  . Anemia   . Diverticulosis   . GERD (gastroesophageal reflux disease)   . History of colonic polyps   . Hypercholesterolemia   . Hypothyroidism   . Internal hemorrhoids   . Thrombophlebitis    varicosities   Past Surgical History:  Procedure Laterality Date  . COLON SURGERY     laparoscopic assisted transverse colectomy for tubular adenomatous polyp  . COLONOSCOPY    . COLONOSCOPY WITH PROPOFOL N/A 07/29/2018   Procedure: COLONOSCOPY WITH PROPOFOL;  Surgeon: Christena Deem, MD;  Location: [facility] ENDOSCOPY;  Service: Endoscopy;  Laterality: N/A;  . DIAGNOSTIC LAPAROSCOPY    . LAPAROSCOPIC GASTRIC BANDING    . URETHRAL DIVERTICULUM REPAIR  1974   Dr Jenne Pane   Family History  Problem Relation Age of Onset  . Cancer Father        lymphoma  . Cancer Maternal Aunt        Breast  . Breast cancer Maternal Aunt 50  . Colon  cancer Other        one relative on maternal side   Social History   Socioeconomic History  . Marital status: Married    Spouse name: Not on file  . Number of children: 2  . Years of education: Not on file  . Highest education level: Not on file  Occupational History  . Not on file  Tobacco Use  . Smoking status: Never Smoker  . Smokeless tobacco: Never Used  . Tobacco comment: smoked as a teenager  Vaping Use  . Vaping Use: Never used  Substance and Sexual Activity  . Alcohol use: Not Currently    Alcohol/week: 0.0 standard drinks    Comment: has a glass of wine about once a month  . Drug use: No  . Sexual activity: Never  Other Topics Concern  . Not on file  Social History Narrative  . Not on file   Social Determinants of Health   Financial Resource Strain: Low Risk   . Difficulty of Paying Living Expenses: Not hard at all  Food Insecurity: No Food Insecurity  . Worried About Programme researcher, broadcasting/film/video in the Last Year: Never true  . Ran Out of Food in the Last Year: Never true  Transportation Needs: No Transportation Needs  . Lack of Transportation (Medical): No  . Lack of Transportation (Non-Medical): No  Physical Activity: Not on file  Stress: No Stress Concern Present  . Feeling of Stress : Not at all  Social Connections: Unknown  . Frequency of Communication with Friends and Family: More than three times a week  . Frequency of Social Gatherings with Friends and Family: More than three times a week  . Attends Religious Services: Not on file  . Active Member of Clubs or Organizations: Not on file  . Attends BankerClub or Organization Meetings: Not on file  . Marital Status: Married    Outpatient Encounter Medications as of 12/29/2019  Medication Sig  . calcium carbonate (TUMS EX) 750 MG chewable tablet Chew 1 tablet by mouth as needed.   . Cholecalciferol (VITAMIN D-3 PO) Take 2,000 Units by mouth daily.  Marland Kitchen. esomeprazole (NEXIUM) 40 MG capsule Take 1 capsule (40 mg  total) by mouth daily.  . fluticasone (FLONASE) 50 MCG/ACT nasal spray Place 2 sprays into both nostrils daily as needed.  Marland Kitchen. levothyroxine (SYNTHROID) 137 MCG tablet Take 1 tablet (137 mcg total) by mouth daily before breakfast.  . Naproxen Sodium (ALEVE PO) Take by mouth as needed.   . Probiotic Product (ALIGN PO) Take 1 capsule by mouth daily.  . rosuvastatin (CRESTOR) 5 MG tablet Take 1 tablet (5 mg total) by mouth daily.  . [DISCONTINUED] FLUoxetine (PROZAC) 10 MG tablet Take 1 tablet (10 mg total) by mouth daily.  . [DISCONTINUED] fluticasone (FLONASE) 50 MCG/ACT nasal spray Place 2 sprays into both nostrils daily as needed.  . [DISCONTINUED] levothyroxine (SYNTHROID) 137 MCG tablet Take 1 tablet (137 mcg total) by mouth daily before breakfast.  . [DISCONTINUED] rosuvastatin (CRESTOR) 5 MG tablet Take 1 tablet (5 mg total) by mouth daily.   No facility-administered encounter medications on file as of 12/29/2019.    Review of Systems  Constitutional: Negative for appetite change and unexpected weight change.  HENT: Negative for congestion and sinus pressure.   Eyes: Negative for pain and visual disturbance.  Respiratory: Negative for cough, chest tightness and shortness of breath.   Cardiovascular: Negative for chest pain, palpitations and leg swelling.  Gastrointestinal: Negative for abdominal pain, diarrhea, nausea and vomiting.  Genitourinary: Negative for difficulty urinating and dysuria.  Musculoskeletal: Negative for joint swelling and myalgias.       Right hip pain as outlined.  Heel pain as outlined.   Skin: Negative for color change and rash.  Neurological: Negative for dizziness, light-headedness and headaches.  Hematological: Negative for adenopathy. Does not bruise/bleed easily.  Psychiatric/Behavioral: Negative for agitation and dysphoric mood.       Objective:    Physical Exam Vitals reviewed.  Constitutional:      General: She is not in acute distress.     Appearance: Normal appearance. She is well-developed and well-nourished.  HENT:     Head: Normocephalic and atraumatic.     Right Ear: External ear normal.     Left Ear: External ear normal.     Mouth/Throat:     Mouth: Oropharynx is clear and moist.  Eyes:     General: No scleral icterus.       Right eye: No discharge.        Left eye: No discharge.     Conjunctiva/sclera: Conjunctivae normal.  Neck:     Thyroid: No thyromegaly.  Cardiovascular:     Rate and Rhythm: Normal rate and regular rhythm.  Pulmonary:     Effort: No tachypnea, accessory muscle usage or respiratory distress.     Breath  sounds: Normal breath sounds. No decreased breath sounds or wheezing.  Chest:  Breasts:     Right: No inverted nipple, mass, nipple discharge or tenderness (no axillary adenopathy).     Left: No inverted nipple, mass, nipple discharge or tenderness (no axilarry adenopathy).    Abdominal:     General: Bowel sounds are normal.     Palpations: Abdomen is soft.     Tenderness: There is no abdominal tenderness.  Musculoskeletal:        General: No swelling, tenderness or edema.     Cervical back: Neck supple. No tenderness.  Lymphadenopathy:     Cervical: No cervical adenopathy.  Skin:    Findings: No erythema or rash.  Neurological:     Mental Status: She is alert and oriented to person, place, and time.  Psychiatric:        Mood and Affect: Mood and affect and mood normal.        Behavior: Behavior normal.     BP 120/64   Pulse 67   Temp 98.2 F (36.8 C) (Oral)   Resp 16   Ht 5\' 5"  (1.651 m)   Wt 266 lb (120.7 kg)   LMP 02/01/1993   SpO2 98%   BMI 44.26 kg/m  Wt Readings from Last 3 Encounters:  12/29/19 266 lb (120.7 kg)  05/15/19 255 lb (115.7 kg)  02/20/19 248 lb (112.5 kg)     Lab Results  Component Value Date   WBC 4.3 03/03/2019   HGB 12.9 03/03/2019   HCT 40.2 03/03/2019   PLT 218.0 03/03/2019   GLUCOSE 89 12/25/2019   CHOL 131 12/25/2019   TRIG 71.0  12/25/2019   HDL 50.30 12/25/2019   LDLDIRECT 129.8 02/15/2012   LDLCALC 66 12/25/2019   ALT 9 12/25/2019   AST 15 12/25/2019   NA 140 12/25/2019   K 4.1 12/25/2019   CL 104 12/25/2019   CREATININE 0.74 12/25/2019   BUN 13 12/25/2019   CO2 32 12/25/2019   TSH 0.55 12/25/2019    MM 3D SCREEN BREAST BILATERAL  Result Date: 08/15/2019 CLINICAL DATA:  Screening. EXAM: DIGITAL SCREENING BILATERAL MAMMOGRAM WITH TOMO AND CAD COMPARISON:  Previous exam(s). ACR Breast Density Category b: There are scattered areas of fibroglandular density. FINDINGS: There are no findings suspicious for malignancy. Images were processed with CAD. IMPRESSION: No mammographic evidence of malignancy. A result letter of this screening mammogram will be mailed directly to the patient. RECOMMENDATION: Screening mammogram in one year. (Code:SM-B-01Y) BI-RADS CATEGORY  1: Negative. Electronically Signed   By: 10/15/2019 M.D.   On: 08/15/2019 16:36       Assessment & Plan:   Problem List Items Addressed This Visit    Hypothyroidism    On thyroid replacement.  Follow tsh.       Relevant Medications   levothyroxine (SYNTHROID) 137 MCG tablet   GERD (gastroesophageal reflux disease)    On nexium.  Upper symptoms controlled.       Hypercholesterolemia    On crestor.  Low cholesterol diet and exercise.  Follow lipid panel and liver function tests.        Relevant Medications   rosuvastatin (CRESTOR) 5 MG tablet   Other Relevant Orders   CBC with Differential/Platelet   Hepatic function panel   Lipid panel   Basic metabolic panel   Vitamin D deficiency    Follow vitamin d level.       Stress    Doing well on prozac.  Follow.        Severe obesity (BMI >= 40) (HCC)    Is s/p lap band surgery.  Previously on phentermine.  Discussed treatment options.  Interested in saxenda.  CCM referral.        Relevant Orders   AMB Referral to [facility]   Health care maintenance    Physical  today 12/29/19.  Mammogram 08/15/19 - Birads I.  Colonoscopy 07/2018.  Recommended f/u in 5 years.       Low back pain   Relevant Orders   DG Lumbar Spine 2-3 Views (Completed)   Right hip pain    Hip pain better after up and moving.  Worse when first gets up.  Persistent pain.  Check l-s spine xray and right hip xray.  Follow.        Relevant Orders   DG HIP UNILAT WITH PELVIS 2-3 VIEWS RIGHT (Completed)   Heel pain    Supports.  Stretches.  Follow.        Other Visit Diagnoses    Routine general medical examination at a health care facility    -  Primary       Dale Allenspark, MD

## 2019-12-31 ENCOUNTER — Other Ambulatory Visit: Payer: Medicare PPO

## 2019-12-31 ENCOUNTER — Other Ambulatory Visit: Payer: Self-pay | Admitting: Internal Medicine

## 2019-12-31 DIAGNOSIS — M545 Low back pain, unspecified: Secondary | ICD-10-CM

## 2019-12-31 DIAGNOSIS — M25551 Pain in right hip: Secondary | ICD-10-CM

## 2019-12-31 NOTE — Progress Notes (Signed)
Order placed for PT referral.  

## 2020-01-01 ENCOUNTER — Ambulatory Visit: Payer: Medicare PPO | Admitting: Internal Medicine

## 2020-01-05 ENCOUNTER — Encounter: Payer: Self-pay | Admitting: Internal Medicine

## 2020-01-05 DIAGNOSIS — M79673 Pain in unspecified foot: Secondary | ICD-10-CM | POA: Insufficient documentation

## 2020-01-05 NOTE — Assessment & Plan Note (Signed)
On crestor.  Low cholesterol diet and exercise.  Follow lipid panel and liver function tests.   

## 2020-01-05 NOTE — Assessment & Plan Note (Signed)
Follow vitamin d level.   

## 2020-01-05 NOTE — Assessment & Plan Note (Signed)
Supports.  Stretches.  Follow.

## 2020-01-05 NOTE — Assessment & Plan Note (Signed)
On nexium.  Upper symptoms controlled.  

## 2020-01-05 NOTE — Assessment & Plan Note (Signed)
Is s/p lap band surgery.  Previously on phentermine.  Discussed treatment options.  Interested in saxenda.  CCM referral.

## 2020-01-05 NOTE — Assessment & Plan Note (Signed)
Doing well on prozac.  Follow  

## 2020-01-05 NOTE — Assessment & Plan Note (Signed)
On thyroid replacement.  Follow tsh.  

## 2020-01-05 NOTE — Assessment & Plan Note (Addendum)
Hip pain better after up and moving.  Worse when first gets up.  Persistent pain.  Check l-s spine xray and right hip xray.  Follow.

## 2020-01-06 ENCOUNTER — Telehealth: Payer: Self-pay

## 2020-01-06 ENCOUNTER — Ambulatory Visit: Payer: Medicare PPO | Admitting: Internal Medicine

## 2020-01-06 NOTE — Chronic Care Management (AMB) (Signed)
  Chronic Care Management   Note  01/06/2020 Name: Lolitha Tortora Szczepanik MRN: [id number] DOB: 1940-03-15  [name] is a 79 y.o. year old female who is a primary care patient of Einar Pheasant, MD. I reached out to [place] by phone today in response to a referral sent by Ms. Janalee Dane Beil's PCP, Dr. Nicki Reaper     Ms. Dejarnette was given information about Chronic Care Management services today including:  1. CCM service includes personalized support from designated clinical staff supervised by her physician, including individualized plan of care and coordination with other care providers 2. 24/7 contact phone numbers for assistance for urgent and routine care needs. 3. Service will only be billed when office clinical staff spend 20 minutes or more in a month to coordinate care. 4. Only one practitioner may furnish and bill the service in a calendar month. 5. The patient may stop CCM services at any time (effective at the end of the month) by phone call to the office staff. 6. The patient will be responsible for cost sharing (co-pay) of up to 20% of the service fee (after annual deductible is met).  Patient agreed to services and verbal consent obtained.   Follow up plan: Telephone appointment with care management team member scheduled for:02/03/2020  Noreene Larsson, [place], [place], [place] [address] Direct Dial: [phone] [email or web address] Website: [email or web address]

## 2020-02-03 ENCOUNTER — Telehealth: Payer: Medicare PPO

## 2020-02-04 ENCOUNTER — Ambulatory Visit: Payer: Medicare PPO | Admitting: Pharmacist

## 2020-02-04 DIAGNOSIS — H02885 Meibomian gland dysfunction left lower eyelid: Secondary | ICD-10-CM | POA: Diagnosis not present

## 2020-02-04 DIAGNOSIS — E039 Hypothyroidism, unspecified: Secondary | ICD-10-CM

## 2020-02-04 DIAGNOSIS — E7800 Pure hypercholesterolemia, unspecified: Secondary | ICD-10-CM

## 2020-02-04 MED ORDER — OZEMPIC (0.25 OR 0.5 MG/DOSE) 2 MG/1.5ML ~~LOC~~ SOPN: PEN_INJECTOR | SUBCUTANEOUS | 2 refills | Status: DC

## 2020-02-04 NOTE — Patient Instructions (Signed)
Visit Information  Patient Care Plan: Medication Management    Problem Identified: Obesity, HLD, GERD     Long-Range Goal: Disease Progression Prevention   This Visit's Progress: On track  Priority: High  Note:   Current Barriers:  . Unable to achieve control of weight   Pharmacist Clinical Goal(s):  Marland Kitchen Over the next 90 days, patient will achieve 5-10% weight loss through collaboration with PharmD and provider.   Interventions: . 1:1 collaboration with Einar Pheasant, MD regarding development and update of comprehensive plan of care as evidenced by provider attestation and co-signature . Inter-disciplinary care team collaboration (see longitudinal plan of care) . Comprehensive medication review performed; medication list updated in electronic medical record  Obesity: . Uncontrolled; current treatment: none  . S/p lap band 2006. Followed with [facility].  Marland Kitchen Hx phentermine therapy. Reported increased anxiety. Lost weight, but regained after discontinuation.  . Baseline weight: 260 lbs . Discussed GLP1 for weight loss per PCP referral. As patient is Medicare, Saxenda/Wegovy are not covered. However, Victoza/Ozempic would be. Discussed Ozempic d/t greater weight loss potential and weekly administration. Discussed that we would be using off label for diabetes, but ample data for active ingredient. Patient interested. Start Ozempic 0.25 mg weekly x 4 weeks then increase to 0.5 mg weekly.  . Discussed patient assistance. Patient over income for GLP1 assistance.  . Discussed eventual goal 150 minutes moderate intensity exercise and dietary modification for 500 kcal deficit/day.   Hyperlipidemia: . Controlled per last lipid panel; current treatment: rosuvastatin 5 mg daily  . Recommended to continue current regimen at this time  Hypothyroidism, hx Graves disease s/p radioablation: . Controlled per last TSH; current regimen: levothyroxine 137 mcg daily . Recommend to continue  current regimen at this time  Patient Goals/Self-Care Activities . Over the next 90 days, patient will:  - target a minimum of 150 minutes of moderate intensity exercise weekly engage in dietary modifications by reduction in portion sizes, snacking  Follow Up Plan: Telephone follow up appointment with care management team member scheduled for: ~ 5 weeks      Ms. Bitterman was given information about Chronic Care Management services today including:  1. CCM service includes personalized support from designated clinical staff supervised by her physician, including individualized plan of care and coordination with other care providers 2. 24/7 contact phone numbers for assistance for urgent and routine care needs. 3. Service will only be billed when office clinical staff spend 20 minutes or more in a month to coordinate care. 4. Only one practitioner may furnish and bill the service in a calendar month. 5. The patient may stop CCM services at any time (effective at the end of the month) by phone call to the office staff. 6. The patient will be responsible for cost sharing (co-pay) of up to 20% of the service fee (after annual deductible is met).  Patient agreed to services and verbal consent obtained.   Patient verbalizes understanding of instructions provided today and agrees to view in [place].   Plan: Telephone follow up appointment with care management team member scheduled for:  ~ 4 weeks  Catie Darnelle Maffucci, PharmD, [place], [place] Clinical Pharmacist [employer] at [employer] [phone]

## 2020-02-04 NOTE — Chronic Care Management (AMB) (Signed)
Chronic Care Management Pharmacy Note  02/04/2020 Name:  Margaree Sandhu Kozel MRN:  [id number] DOB:  24-Apr-1940  Subjective: Jodi Harris is an 80 y.o. year old female who is a primary patient of Einar Pheasant, MD.  The CCM team was consulted for assistance with disease management and care coordination needs.    Engaged with patient by telephone for initial visit in response to provider referral for pharmacy case management and/or care coordination services.   Consent to Services:  The patient was given the following information about Chronic Care Management services today, agreed to services, and gave verbal consent: 1. CCM service includes personalized support from designated clinical staff supervised by the primary care provider, including individualized plan of care and coordination with other care providers 2. 24/7 contact phone numbers for assistance for urgent and routine care needs. 3. Service will only be billed when office clinical staff spend 20 minutes or more in a month to coordinate care. 4. Only one practitioner may furnish and bill the service in a calendar month. 5.The patient may stop CCM services at any time (effective at the end of the month) by phone call to the office staff. 6. The patient will be responsible for cost sharing (co-pay) of up to 20% of the service fee (after annual deductible is met). Patient agreed to services and consent obtained.  Objective:  Lab Results  Component Value Date   CREATININE 0.74 12/25/2019   CREATININE 0.87 03/03/2019   CREATININE 0.72 09/06/2018        Component Value Date/Time   CHOL 131 12/25/2019 0935   TRIG 71.0 12/25/2019 0935   HDL 50.30 12/25/2019 0935   CHOLHDL 3 12/25/2019 0935   VLDL 14.2 12/25/2019 0935   LDLCALC 66 12/25/2019 0935   LDLDIRECT 129.8 02/15/2012 0912   Lab Results  Component Value Date   TSH 0.55 12/25/2019     Clinical ASCVD: No  The 10-year ASCVD risk score Mikey Bussing DC Jr., et al., 2013)  is: 21.8%   Values used to calculate the score:     Age: 40 years     Sex: Female     Is Non-Hispanic African American: No     Diabetic: No     Tobacco smoker: No     Systolic Blood Pressure: 616 mmHg     Is BP treated: No     HDL Cholesterol: 50.3 mg/dL     Total Cholesterol: 131 mg/dL     BP Readings from Last 3 Encounters:  12/29/19 120/64  09/09/18 (!) 120/58  07/29/18 (!) 114/49    Assessment: Review of patient past medical history, allergies, medications, health status, including review of consultants reports, laboratory and other test data, was performed as part of comprehensive evaluation and provision of chronic care management services.   SDOH:  (Social Determinants of Health) assessments and interventions performed:  SDOH Interventions   Flowsheet Row Most Recent Value  SDOH Interventions   Financial Strain Interventions Intervention Not Indicated      CCM Care Plan  Allergies  Allergen Reactions  . Codeine Sulfate   . Penicillins     Medications Reviewed Today    Reviewed by De Hollingshead, RPH-CPP (Pharmacist) on 02/04/20 at 1441  Med List Status: <None>  Medication Order Taking? Sig Documenting Provider Last Dose Status Informant  calcium carbonate (TUMS EX) 750 MG chewable tablet [id number] Yes Chew 1 tablet by mouth as needed.  [provider] Taking Active   Cholecalciferol (VITAMIN D-3 PO)  [id number] Yes Take 2,000 Units by mouth daily. [provider] Taking Active   esomeprazole (NEXIUM) 40 MG capsule [id number] Yes Take 1 capsule (40 mg total) by mouth daily. Einar Pheasant, MD Taking Active   fluticasone [facility]) 50 MCG/ACT nasal spray [id number] Yes Place 2 sprays into both nostrils daily as needed. Einar Pheasant, MD Taking Active   levothyroxine (SYNTHROID) 137 MCG tablet [id number] Yes Take 1 tablet (137 mcg total) by mouth daily before breakfast. Einar Pheasant, MD Taking Active   Naproxen Sodium (ALEVE PO) [id number] Yes Take  by mouth as needed.  [provider] Taking Active   Probiotic Product (ALIGN PO) [id number] Yes Take 1 capsule by mouth daily. [provider] Taking Active   rosuvastatin (CRESTOR) 5 MG tablet [id number] Yes Take 1 tablet (5 mg total) by mouth daily. Einar Pheasant, MD Taking Active           Patient Active Problem List   Diagnosis Date Noted  . Heel pain 01/05/2020  . Low back pain 12/29/2019  . Right hip pain 12/29/2019  . Dizziness 11/18/2018  . Right knee pain 05/04/2018  . Weight loss counseling, encounter for 05/04/2018  . Left leg pain 06/24/2017  . Health care maintenance 04/06/2014  . Stress 10/03/2013  . Severe obesity (BMI >= 40) ([place]) 10/03/2013  . Left hip pain 09/22/2013  . Skin lesions 04/13/2013  . Hypothyroidism 02/04/2012  . History of colon polyps 02/04/2012  . GERD (gastroesophageal reflux disease) 02/04/2012  . Hypercholesterolemia 02/04/2012  . Vitamin D deficiency 02/04/2012    Conditions to be addressed/monitored: HLD and Obesity, GERD  Care Plan : Medication Management  Updates made by De Hollingshead, RPH-CPP since 02/04/2020 12:00 AM    Problem: Obesity, HLD, GERD     Long-Range Goal: Disease Progression Prevention   This Visit's Progress: On track  Priority: High  Note:   Current Barriers:  . Unable to achieve control of weight   Pharmacist Clinical Goal(s):  Marland Kitchen Over the next 90 days, patient will achieve 5-10% weight loss through collaboration with PharmD and provider.   Interventions: . 1:1 collaboration with Einar Pheasant, MD regarding development and update of comprehensive plan of care as evidenced by provider attestation and co-signature . Inter-disciplinary care team collaboration (see longitudinal plan of care) . Comprehensive medication review performed; medication list updated in electronic medical record  Obesity: . Uncontrolled; current treatment: none  . S/p lap band 2006. Followed with [facility].  Marland Kitchen Hx phentermine therapy. Reported increased anxiety. Lost weight, but regained after discontinuation.  . Baseline weight: 260 lbs . Discussed GLP1 for weight loss per PCP referral. As patient is Medicare, Saxenda/Wegovy are not covered. However, Victoza/Ozempic would be. Discussed Ozempic d/t greater weight loss potential and weekly administration. Discussed that we would be using off label for diabetes, but ample data for active ingredient. Patient interested. Start Ozempic 0.25 mg weekly x 4 weeks then increase to 0.5 mg weekly.  . Discussed patient assistance. Patient over income for GLP1 assistance.  . Discussed eventual goal 150 minutes moderate intensity exercise and dietary modification for 500 kcal deficit/day.   Hyperlipidemia: . Controlled per last lipid panel; current treatment: rosuvastatin 5 mg daily  . Recommended to continue current regimen at this time  Hypothyroidism, hx Graves disease s/p radioablation: . Controlled per last TSH; current regimen: levothyroxine 137 mcg daily . Recommend to continue current regimen at this time  Patient Goals/Self-Care Activities . Over the next 90 days, patient will:  -  target a minimum of 150 minutes of moderate intensity exercise weekly engage in dietary modifications by reduction in portion sizes, snacking  Follow Up Plan: Telephone follow up appointment with care management team member scheduled for: ~ 5 weeks      Medication Assistance: None required.  Patient affirms current coverage meets needs. Over income for GLP1 assistance.   Follow Up:  Patient agrees to Care Plan and Follow-up.  Plan: Telephone follow up appointment with care management team member scheduled for:  ~ 4 weeks  Catie Darnelle Maffucci, PharmD, [place], [place] Clinical Pharmacist [employer] at [employer] [phone]

## 2020-02-10 ENCOUNTER — Ambulatory Visit: Payer: Medicare PPO | Admitting: Physical Therapy

## 2020-02-17 ENCOUNTER — Encounter: Payer: Medicare PPO | Admitting: Physical Therapy

## 2020-02-19 ENCOUNTER — Encounter: Payer: Medicare PPO | Admitting: Physical Therapy

## 2020-02-23 ENCOUNTER — Ambulatory Visit (INDEPENDENT_AMBULATORY_CARE_PROVIDER_SITE_OTHER): Payer: Medicare PPO

## 2020-02-23 VITALS — Ht 65.0 in | Wt 266.0 lb

## 2020-02-23 DIAGNOSIS — Z0000 Encounter for general adult medical examination without abnormal findings: Secondary | ICD-10-CM | POA: Diagnosis not present

## 2020-02-23 NOTE — Patient Instructions (Addendum)
Jodi Harris , Thank you for taking time to come for your Medicare Wellness Visit. I appreciate your ongoing commitment to your health goals. Please review the following plan we discussed and let me know if I can assist you in the future.   These are the goals we discussed: Goals      Patient Stated   .  Medication Monitoring (pt-stated)      Patient Goals/Self-Care Activities . Over the next 90 days, patient will:  - target a minimum of 150 minutes of moderate intensity exercise weekly engage in dietary modifications by reduction in portion sizes, snacking       This is a list of the screening recommended for you and due dates:  Health Maintenance  Topic Date Due  . Tetanus Vaccine  03/09/2020  . Mammogram  08/13/2020  . Colon Cancer Screening  07/29/2023  . Flu Shot  Completed  . DEXA scan (bone density measurement)  Completed  . COVID-19 Vaccine  Completed  . Pneumonia vaccines  Completed  .  Hepatitis C: One time screening is recommended by Center for Disease Control  (CDC) for  adults born from [birth year] through 1965.   Discontinued    Immunizations Immunization History  Administered Date(s) Administered  . Influenza Split 12/03/2011, 11/12/2012, 11/13/2013  . Influenza, High Dose Seasonal PF 11/23/2017  . Influenza,inj,quad, With Preservative 11/17/2016, 10/23/2018  . Influenza-Unspecified 11/14/2014, 11/04/2015, 11/28/2019  . Moderna Sars-Covid-2 Vaccination 01/21/2019, 02/18/2019, 11/06/2019  . Pneumococcal Conjugate-13 04/24/2013  . Pneumococcal Polysaccharide-23 02/02/2012  . Tdap 03/10/2010  . Zoster 07/01/2008  . Zoster Recombinat (Shingrix) 12/14/2017, 03/13/2018, 06/20/2018   Keep all routine maintenance appointments.   CCM 03/02/20 @ 1:00  Advanced directives: End of life planning; Advance aging; Advanced directives discussed.  Copy of current HCPOA/Living Will requested.    Conditions/risks identified: none new  Follow up in one year for your annual  wellness visit.   Preventive Care 47 Years and Older, Female Preventive care refers to lifestyle choices and visits with your health care provider that can promote health and wellness. What does preventive care include?  A yearly physical exam. This is also called an annual well check.  Dental exams once or twice a year.  Routine eye exams. Ask your health care provider how often you should have your eyes checked.  Personal lifestyle choices, including:  Daily care of your teeth and gums.  Regular physical activity.  Eating a healthy diet.  Avoiding tobacco and drug use.  Limiting alcohol use.  Practicing safe sex.  Taking low-dose aspirin every day.  Taking vitamin and mineral supplements as recommended by your health care provider. What happens during an annual well check? The services and screenings done by your health care provider during your annual well check will depend on your age, overall health, lifestyle risk factors, and family history of disease. Counseling  Your health care provider may ask you questions about your:  Alcohol use.  Tobacco use.  Drug use.  Emotional well-being.  Home and relationship well-being.  Sexual activity.  Eating habits.  History of falls.  Memory and ability to understand (cognition).  Work and work Astronomer.  Reproductive health. Screening  You may have the following tests or measurements:  Height, weight, and BMI.  Blood pressure.  Lipid and cholesterol levels. These may be checked every 5 years, or more frequently if you are over 60 years old.  Skin check.  Lung cancer screening. You may have this screening every year starting at  age 63 if you have a 30-pack-year history of smoking and currently smoke or have quit within the past 15 years.  Fecal occult blood test (FOBT) of the stool. You may have this test every year starting at age 105.  Flexible sigmoidoscopy or colonoscopy. You may have a  sigmoidoscopy every 5 years or a colonoscopy every 10 years starting at age 60.  Hepatitis C blood test.  Hepatitis B blood test.  Sexually transmitted disease (STD) testing.  Diabetes screening. This is done by checking your blood sugar (glucose) after you have not eaten for a while (fasting). You may have this done every 1-3 years.  Bone density scan. This is done to screen for osteoporosis. You may have this done starting at age 9.  Mammogram. This may be done every 1-2 years. Talk to your health care provider about how often you should have regular mammograms. Talk with your health care provider about your test results, treatment options, and if necessary, the need for more tests. Vaccines  Your health care provider may recommend certain vaccines, such as:  Influenza vaccine. This is recommended every year.  Tetanus, diphtheria, and acellular pertussis (Tdap, Td) vaccine. You may need a Td booster every 10 years.  Zoster vaccine. You may need this after age 7.  Pneumococcal 13-valent conjugate (PCV13) vaccine. One dose is recommended after age 49.  Pneumococcal polysaccharide (PPSV23) vaccine. One dose is recommended after age 93. Talk to your health care provider about which screenings and vaccines you need and how often you need them. This information is not intended to replace advice given to you by your health care provider. Make sure you discuss any questions you have with your health care provider. Document Released: 01/22/2015 Document Revised: 09/15/2015 Document Reviewed: 10/27/2014 Elsevier Interactive Patient Education  2017 [employer].  Fall Prevention in the Home Falls can cause injuries. They can happen to people of all ages. There are many things you can do to make your home safe and to help prevent falls. What can I do on the outside of my home?  Regularly fix the edges of walkways and driveways and fix any cracks.  Remove anything that might make you  trip as you walk through a door, such as a raised step or threshold.  Trim any bushes or trees on the path to your home.  Use bright outdoor lighting.  Clear any walking paths of anything that might make someone trip, such as rocks or tools.  Regularly check to see if handrails are loose or broken. Make sure that both sides of any steps have handrails.  Any raised decks and porches should have guardrails on the edges.  Have any leaves, snow, or ice cleared regularly.  Use sand or salt on walking paths during winter.  Clean up any spills in your garage right away. This includes oil or grease spills. What can I do in the bathroom?  Use night lights.  Install grab bars by the toilet and in the tub and shower. Do not use towel bars as grab bars.  Use non-skid mats or decals in the tub or shower.  If you need to sit down in the shower, use a plastic, non-slip stool.  Keep the floor dry. Clean up any water that spills on the floor as soon as it happens.  Remove soap buildup in the tub or shower regularly.  Attach bath mats securely with double-sided non-slip rug tape.  Do not have throw rugs and other things on  the floor that can make you trip. What can I do in the bedroom?  Use night lights.  Make sure that you have a light by your bed that is easy to reach.  Do not use any sheets or blankets that are too big for your bed. They should not hang down onto the floor.  Have a firm chair that has side arms. You can use this for support while you get dressed.  Do not have throw rugs and other things on the floor that can make you trip. What can I do in the kitchen?  Clean up any spills right away.  Avoid walking on wet floors.  Keep items that you use a lot in easy-to-reach places.  If you need to reach something above you, use a strong step stool that has a grab bar.  Keep electrical cords out of the way.  Do not use floor polish or wax that makes floors slippery. If  you must use wax, use non-skid floor wax.  Do not have throw rugs and other things on the floor that can make you trip. What can I do with my stairs?  Do not leave any items on the stairs.  Make sure that there are handrails on both sides of the stairs and use them. Fix handrails that are broken or loose. Make sure that handrails are as long as the stairways.  Check any carpeting to make sure that it is firmly attached to the stairs. Fix any carpet that is loose or worn.  Avoid having throw rugs at the top or bottom of the stairs. If you do have throw rugs, attach them to the floor with carpet tape.  Make sure that you have a light switch at the top of the stairs and the bottom of the stairs. If you do not have them, ask someone to add them for you. What else can I do to help prevent falls?  Wear shoes that:  Do not have high heels.  Have rubber bottoms.  Are comfortable and fit you well.  Are closed at the toe. Do not wear sandals.  If you use a stepladder:  Make sure that it is fully opened. Do not climb a closed stepladder.  Make sure that both sides of the stepladder are locked into place.  Ask someone to hold it for you, if possible.  Clearly mark and make sure that you can see:  Any grab bars or handrails.  First and last steps.  Where the edge of each step is.  Use tools that help you move around (mobility aids) if they are needed. These include:  Canes.  Walkers.  Scooters.  Crutches.  Turn on the lights when you go into a dark area. Replace any light bulbs as soon as they burn out.  Set up your furniture so you have a clear path. Avoid moving your furniture around.  If any of your floors are uneven, fix them.  If there are any pets around you, be aware of where they are.  Review your medicines with your doctor. Some medicines can make you feel dizzy. This can increase your chance of falling. Ask your doctor what other things that you can do to  help prevent falls. This information is not intended to replace advice given to you by your health care provider. Make sure you discuss any questions you have with your health care provider. Document Released: 10/22/2008 Document Revised: 06/03/2015 Document Reviewed: 01/30/2014 Elsevier Interactive Patient Education  2017 [place].

## 2020-02-23 NOTE — Progress Notes (Signed)
Subjective:   Jodi Harris is a 80 y.o. female who presents for Medicare Annual (Subsequent) preventive examination.  Review of Systems    No ROS.  Medicare Wellness Virtual Visit.    Cardiac Risk Factors include: advanced age (>80men, >108 women)     Objective:    Today's Vitals   02/23/20 1346  Weight: 266 lb (120.7 kg)  Height: 5\' 5"  (1.651 m)   Body mass index is 44.26 kg/m.  Advanced Directives 02/23/2020 02/20/2019 07/29/2018 02/18/2018 02/15/2017 02/14/2016 01/29/2015  Does Patient Have a Medical Advance Directive? Yes Yes Yes Yes Yes Yes Yes  Type of 01/31/2015 of [place];Living will Healthcare Power of [place];Living will Healthcare Power of [place];Living will Healthcare Power of [place];Living will Healthcare Power of [place];Living will Healthcare Power of [place];Living will Healthcare Power of [place];Living will  Does patient want to make changes to medical advance directive? No - Patient declined No - Patient declined - No - Patient declined No - Patient declined No - Patient declined No - Patient declined  Copy of Healthcare Power of Attorney in Chart? No - copy requested No - copy requested No - copy requested No - copy requested No - copy requested No - copy requested No - copy requested    Current Medications (verified) Outpatient Encounter Medications as of 02/23/2020  Medication Sig  . calcium carbonate (TUMS EX) 750 MG chewable tablet Chew 1 tablet by mouth as needed.   . Cholecalciferol (VITAMIN D-3 PO) Take 2,000 Units by mouth daily.  02/25/2020 esomeprazole (NEXIUM) 40 MG capsule Take 1 capsule (40 mg total) by mouth daily.  . fluticasone (FLONASE) 50 MCG/ACT nasal spray Place 2 sprays into both nostrils daily as needed.  Marland Kitchen levothyroxine (SYNTHROID) 137 MCG tablet Take 1 tablet (137 mcg total) by mouth daily before breakfast.  . Naproxen Sodium (ALEVE PO) Take by mouth as needed.   . Probiotic Product (ALIGN PO) Take 1 capsule by  mouth daily.  . rosuvastatin (CRESTOR) 5 MG tablet Take 1 tablet (5 mg total) by mouth daily.  . Semaglutide,0.25 or 0.5MG /DOS, (OZEMPIC, 0.25 OR 0.5 MG/DOSE,) 2 MG/1.5ML SOPN Inject 0.25 mg weekly for 4 weeks then increase to 0.5 mg weekly   No facility-administered encounter medications on file as of 02/23/2020.    Allergies (verified) Codeine sulfate and Penicillins   History: Past Medical History:  Diagnosis Date  . Anemia   . Diverticulosis   . GERD (gastroesophageal reflux disease)   . History of colonic polyps   . Hypercholesterolemia   . Hypothyroidism   . Internal hemorrhoids   . Thrombophlebitis    varicosities   Past Surgical History:  Procedure Laterality Date  . COLON SURGERY     laparoscopic assisted transverse colectomy for tubular adenomatous polyp  . COLONOSCOPY    . COLONOSCOPY WITH PROPOFOL N/A 07/29/2018   Procedure: COLONOSCOPY WITH PROPOFOL;  Surgeon: 07/31/2018, MD;  Location: [facility] ENDOSCOPY;  Service: Endoscopy;  Laterality: N/A;  . DIAGNOSTIC LAPAROSCOPY    . LAPAROSCOPIC GASTRIC BANDING    . URETHRAL DIVERTICULUM REPAIR  1974   Dr [facility]   Family History  Problem Relation Age of Onset  . Cancer Father        lymphoma  . Cancer Maternal Aunt        Breast  . Breast cancer Maternal Aunt 50  . Colon cancer Other        one relative on maternal side   Social History  Socioeconomic History  . Marital status: Married    Spouse name: Not on file  . Number of children: 2  . Years of education: Not on file  . Highest education level: Not on file  Occupational History  . Not on file  Tobacco Use  . Smoking status: Never Smoker  . Smokeless tobacco: Never Used  . Tobacco comment: smoked as a teenager  Vaping Use  . Vaping Use: Never used  Substance and Sexual Activity  . Alcohol use: Not Currently    Alcohol/week: 0.0 standard drinks    Comment: has a glass of wine about once a month  . Drug use: No  . Sexual activity: Never   Other Topics Concern  . Not on file  Social History Narrative  . Not on file   Social Determinants of Health   Financial Resource Strain: Low Risk   . Difficulty of Paying Living Expenses: Not hard at all  Food Insecurity: No Food Insecurity  . Worried About Programme researcher, broadcasting/film/video in the Last Year: Never true  . Ran Out of Food in the Last Year: Never true  Transportation Needs: No Transportation Needs  . Lack of Transportation (Medical): No  . Lack of Transportation (Non-Medical): No  Physical Activity: Not on file  Stress: No Stress Concern Present  . Feeling of Stress : Not at all  Social Connections: Unknown  . Frequency of Communication with Friends and Family: More than three times a week  . Frequency of Social Gatherings with Friends and Family: More than three times a week  . Attends Religious Services: Not on file  . Active Member of Clubs or Organizations: Not on file  . Attends Banker Meetings: Not on file  . Marital Status: Married    Tobacco Counseling Counseling given: Not Answered Comment: smoked as a teenager   Clinical Intake:  Pre-visit preparation completed: Yes        Diabetes: No  How often do you need to have someone help you when you read instructions, pamphlets, or other written materials from your doctor or pharmacy?: 1 - Never   Interpreter Needed?: No      Activities of Daily Living In your present state of health, do you have any difficulty performing the following activities: 02/23/2020  Hearing? N  Vision? N  Difficulty concentrating or making decisions? N  Walking or climbing stairs? N  Dressing or bathing? N  Doing errands, shopping? N  Preparing Food and eating ? N  Using the Toilet? N  In the past six months, have you accidently leaked urine? N  Do you have problems with loss of bowel control? N  Managing your Medications? N  Managing your Finances? N  Housekeeping or managing your Housekeeping? N  Some  recent data might be hidden    Patient Care Team: Dale McConnell, MD as PCP - General (Internal Medicine) Lourena Simmonds, RPH-CPP (Pharmacist)  Indicate any recent Medical Services you may have received from other than [facility] providers in the past year (date may be approximate).     Assessment:   This is a routine wellness examination for Jemimah.  I connected with Shakemia today by telephone and verified that I am speaking with the correct person using two identifiers. Location patient: home Location provider: work Persons participating in the virtual visit: patient, Engineer, civil (consulting).    I discussed the limitations, risks, security and privacy concerns of performing an evaluation and management service by telephone and the availability  of in person appointments. The patient expressed understanding and verbally consented to this telephonic visit.    Interactive audio and video telecommunications were attempted between this provider and patient, however failed, due to patient having technical difficulties OR patient did not have access to video capability.  We continued and completed visit with audio only.  Some vital signs may be absent or patient reported.   Hearing/Vision screen  Hearing Screening   125Hz  250Hz  500Hz  1000Hz  2000Hz  3000Hz  4000Hz  6000Hz  8000Hz   Right ear:           Left ear:           Comments: Patient is able to hear conversational tones without difficulty. No issues reported.  Vision Screening Comments: Wears corrective lenses  Cataract extraction, bilateral  Visual acuity not assessed, virtual visit. They have seen their ophthalmologist.   Dietary issues and exercise activities discussed: Current Exercise Habits: Home exercise routine  Healthy diet Good water intake  Goals      Patient Stated   .  Medication Monitoring (pt-stated)      Patient Goals/Self-Care Activities . Over the next 90 days, patient will:  - target a minimum of 150 minutes of moderate intensity  exercise weekly engage in dietary modifications by reduction in portion sizes, snacking      Depression Screen PHQ 2/9 Scores 02/23/2020 12/29/2019 02/20/2019 02/18/2018 02/15/2017 02/14/2016 12/14/2015  PHQ - 2 Score 0 0 0 0 0 0 0    Fall Risk Fall Risk  02/23/2020 02/20/2019 02/18/2018 02/15/2017 02/14/2016  Falls in the past year? 0 0 0 No No  Number falls in past yr: 0 - - - -  Injury with Fall? 0 - - - -  Risk for fall due to : - - - - -  Follow up Falls evaluation completed Falls evaluation completed - - -    FALL RISK PREVENTION PERTAINING TO THE HOME: Handrails in use when climbing stairs? Yes Home free of loose throw rugs in walkways, pet beds, electrical cords, etc? Yes  Adequate lighting in your home to reduce risk of falls? Yes   ASSISTIVE DEVICES UTILIZED TO PREVENT FALLS: Use of a cane, walker or w/c? No   TIMED UP AND GO: Was the test performed? No . Virtual visit.   Cognitive Function: Patient is alert and oriented x3.  Denies difficulty focusing, making decisions, memory loss.  Enjoys brain health activities.  MMSE/6CIT deferred. Normal by direct communication/observation.    MMSE - Mini Mental State Exam 02/15/2017 01/29/2015  Orientation to time 5 5  Orientation to Place 5 5  Registration 3 3  Attention/ Calculation 5 5  Recall 3 3  Language- name 2 objects 2 2  Language- repeat 1 1  Language- follow 3 step command 3 3  Language- read & follow direction 1 1  Write a sentence 1 1  Copy design 1 1  Total score 30 30     6CIT Screen 02/20/2019 02/18/2018 02/14/2016  What Year? 0 points 0 points 0 points  What month? 0 points 0 points 0 points  What time? 0 points 0 points 0 points  Count back from 20 0 points 0 points 0 points  Months in reverse 0 points 0 points 0 points  Repeat phrase 0 points 0 points 0 points  Total Score 0 0 0    Immunizations Immunization History  Administered Date(s) Administered  . Influenza Split 12/03/2011, 11/12/2012, 11/13/2013  .  Influenza, High Dose Seasonal PF 11/23/2017  .  Influenza,inj,quad, With Preservative 11/17/2016, 10/23/2018  . Influenza-Unspecified 11/14/2014, 11/04/2015, 11/28/2019  . Moderna Sars-Covid-2 Vaccination 01/21/2019, 02/18/2019, 11/06/2019  . Pneumococcal Conjugate-13 04/24/2013  . Pneumococcal Polysaccharide-23 02/02/2012  . Tdap 03/10/2010  . Zoster 07/01/2008  . Zoster Recombinat (Shingrix) 12/14/2017, 03/13/2018, 06/20/2018   Health Maintenance Health Maintenance  Topic Date Due  . TETANUS/TDAP  03/09/2020  . MAMMOGRAM  08/13/2020  . COLONOSCOPY (Pts 45-54yrs Insurance coverage will need to be confirmed)  07/29/2023  . INFLUENZA VACCINE  Completed  . DEXA SCAN  Completed  . COVID-19 Vaccine  Completed  . PNA vac Low Risk Adult  Completed  . Hepatitis C Screening  Discontinued   Colorectal cancer screening: Type of screening: Colonoscopy. Completed 07/29/18. Repeat every 5 years  Mammogram status: Completed 08/14/19. Repeat every year   Lung Cancer Screening: (Low Dose CT Chest recommended if Age 87-80 years, 30 pack-year currently smoking OR have quit w/in 15years.) does not qualify.   Hepatitis C Screening: Discontinued per patient preference.   Vision Screening: Recommended annual ophthalmology exams for early detection of glaucoma and other disorders of the eye. Is the patient up to date with their annual eye exam?  Yes   Dental Screening: Recommended annual dental exams for proper oral hygiene.  Community Resource Referral / Chronic Care Management: CRR required this visit?  No    CCM required this visit?  No      Plan:   Keep all routine maintenance appointments.   CCM 03/02/20 @ 1:00  I have personally reviewed and noted the following in the patient's chart:   . Medical and social history . Use of alcohol, tobacco or illicit drugs  . Current medications and supplements . Functional ability and status . Nutritional status . Physical activity . Advanced  directives . List of other physicians . Hospitalizations, surgeries, and ER visits in previous 12 months . Vitals . Screenings to include cognitive, depression, and falls . Referrals and appointments  In addition, I have reviewed and discussed with patient certain preventive protocols, quality metrics, and best practice recommendations. A written personalized care plan for preventive services as well as general preventive health recommendations were provided to patient via mychart.     Ashok Pall, LPN   [id number]

## 2020-02-24 ENCOUNTER — Encounter: Payer: Medicare PPO | Admitting: Physical Therapy

## 2020-02-26 ENCOUNTER — Encounter: Payer: Medicare PPO | Admitting: Physical Therapy

## 2020-03-02 ENCOUNTER — Encounter: Payer: Medicare PPO | Admitting: Physical Therapy

## 2020-03-02 ENCOUNTER — Ambulatory Visit (INDEPENDENT_AMBULATORY_CARE_PROVIDER_SITE_OTHER): Payer: Medicare PPO | Admitting: Pharmacist

## 2020-03-02 DIAGNOSIS — R7301 Impaired fasting glucose: Secondary | ICD-10-CM

## 2020-03-02 DIAGNOSIS — E7800 Pure hypercholesterolemia, unspecified: Secondary | ICD-10-CM

## 2020-03-02 DIAGNOSIS — E039 Hypothyroidism, unspecified: Secondary | ICD-10-CM

## 2020-03-02 MED ORDER — OZEMPIC (0.25 OR 0.5 MG/DOSE) 2 MG/1.5ML ~~LOC~~ SOPN: 0.5000 mg | PEN_INJECTOR | SUBCUTANEOUS | 2 refills | Status: DC

## 2020-03-02 NOTE — Progress Notes (Signed)
Chronic Care Management Pharmacy Note  03/02/2020 Name:  Jodi Harris MRN:  [id number] DOB:  [birth year]  Subjective: Jodi Harris is an 80 y.o. year old female who is a primary patient of Jodi Pheasant, MD.  The CCM team was consulted for assistance with disease management and care coordination needs.    Engaged with patient by telephone for follow up visit in response to provider referral for pharmacy case management and/or care coordination services.   Consent to Services:  The patient was given information about Chronic Care Management services, agreed to services, and gave verbal consent prior to initiation of services.  Please see initial visit note for detailed documentation.   Patient Care Team: Jodi Pheasant, MD as PCP - General (Internal Medicine) De Hollingshead, RPH-CPP (Pharmacist)  Recent office visits: None  Recent consult visits: None  Hospital visits: None in previous 6 months  Objective:  Lab Results  Component Value Date   CREATININE 0.74 12/25/2019   BUN 13 12/25/2019   GFR 77.12 12/25/2019   NA 140 12/25/2019   K 4.1 12/25/2019   CALCIUM 8.9 12/25/2019   CO2 32 12/25/2019    Lab Results  Component Value Date/Time   GFR 77.12 12/25/2019 09:35 AM   GFR 62.92 03/03/2019 09:33 AM    Last diabetic Eye exam: No results found for: HMDIABEYEEXA  Last diabetic Foot exam: No results found for: HMDIABFOOTEX   Lab Results  Component Value Date   CHOL 131 12/25/2019   HDL 50.30 12/25/2019   LDLCALC 66 12/25/2019   LDLDIRECT 129.8 02/15/2012   TRIG 71.0 12/25/2019   CHOLHDL 3 12/25/2019    Hepatic Function Latest Ref Rng & Units 12/25/2019 03/03/2019 09/06/2018  Total Protein 6.0 - 8.3 g/dL 6.1 6.6 5.7(L)  Albumin 3.5 - 5.2 g/dL 3.6 3.8 3.5  AST 0 - 37 U/L $Remo'15 14 13  'mhckX$ ALT 0 - 35 U/L $Remo'9 9 7  'sWrKe$ Alk Phosphatase 39 - 117 U/L 67 68 64  Total Bilirubin 0.2 - 1.2 mg/dL 0.6 0.6 0.5  Bilirubin, Direct 0.0 - 0.3 mg/dL 0.1 0.1 0.1    Lab  Results  Component Value Date/Time   TSH 0.55 12/25/2019 09:35 AM   TSH 1.03 09/06/2018 09:30 AM    CBC Latest Ref Rng & Units 03/03/2019 10/18/2017 10/23/2016  WBC 4.0 - 10.5 K/uL 4.3 4.8 4.6  Hemoglobin 12.0 - 15.0 g/dL 12.9 13.2 13.4  Hematocrit 36.0 - 46.0 % 40.2 40.7 41.7  Platelets 150.0 - 400.0 K/uL 218.0 206.0 227.0    Lab Results  Component Value Date/Time   VD25OH 60.12 02/12/2017 09:33 AM   VD25OH 48.87 09/20/2015 10:56 AM    Clinical ASCVD: No  The ASCVD Risk score ([place]., et al., 2013) failed to calculate for the following reasons:   The systolic blood pressure is missing    Depression screen [facility] 2/9 02/23/2020 12/29/2019 02/20/2019  Decreased Interest 0 0 0  Down, Depressed, Hopeless 0 0 0  PHQ - 2 Score 0 0 0     Social History   Tobacco Use  Smoking Status Never Smoker  Smokeless Tobacco Never Used  Tobacco Comment   smoked as a teenager   BP Readings from Last 3 Encounters:  12/29/19 120/64  09/09/18 (!) 120/58  07/29/18 (!) 114/49   Pulse Readings from Last 3 Encounters:  12/29/19 67  09/09/18 63  07/29/18 81   Wt Readings from Last 3 Encounters:  02/23/20 266 lb (120.7 kg)  12/29/19  266 lb (120.7 kg)  05/15/19 255 lb (115.7 kg)    Assessment/Interventions: Review of patient past medical history, allergies, medications, health status, including review of consultants reports, laboratory and other test data, was performed as part of comprehensive evaluation and provision of chronic care management services.   SDOH:  (Social Determinants of Health) assessments and interventions performed: Yes SDOH Interventions   Flowsheet Row Most Recent Value  SDOH Interventions   Food Insecurity Interventions Intervention Not Indicated  Financial Strain Interventions Intervention Not Indicated  Physical Activity Interventions Other (Comments)  [encouraged increased physical activity]      CCM Care Plan  Allergies  Allergen Reactions  . Codeine  Sulfate   . Penicillins     Medications Reviewed Today    Reviewed by De Hollingshead, RPH-CPP (Pharmacist) on 03/02/20 at 1315  Med List Status: <None>  Medication Order Taking? Sig Documenting Provider Last Dose Status Informant  calcium carbonate (TUMS EX) 750 MG chewable tablet [id number] Yes Chew 1 tablet by mouth as needed.  [provider] Taking Active   Cholecalciferol (VITAMIN D-3 PO) [id number] Yes Take 2,000 Units by mouth daily. [provider] Taking Active   esomeprazole (NEXIUM) 40 MG capsule [id number] Yes Take 1 capsule (40 mg total) by mouth daily. Jodi Pheasant, MD Taking Active   fluticasone [facility]) 50 MCG/ACT nasal spray [id number] Yes Place 2 sprays into both nostrils daily as needed. Jodi Pheasant, MD Taking Active   levothyroxine (SYNTHROID) 137 MCG tablet [id number] Yes Take 1 tablet (137 mcg total) by mouth daily before breakfast. Jodi Pheasant, MD Taking Active   rosuvastatin (CRESTOR) 5 MG tablet [id number] Yes Take 1 tablet (5 mg total) by mouth daily. Jodi Pheasant, MD Taking Active   Semaglutide,0.25 or 0.5MG /DOS, (OZEMPIC, 0.25 OR 0.5 MG/DOSE,) 2 MG/1.5ML SOPN [id number] Yes Inject 0.25 mg weekly for 4 weeks then increase to 0.5 mg weekly Jodi Pheasant, MD Taking Active           Patient Active Problem List   Diagnosis Date Noted  . Heel pain 01/05/2020  . Low back pain 12/29/2019  . Right hip pain 12/29/2019  . Dizziness 11/18/2018  . Right knee pain 05/04/2018  . Weight loss counseling, encounter for 05/04/2018  . Left leg pain 06/24/2017  . Health care maintenance 04/06/2014  . Stress 10/03/2013  . Severe obesity (BMI >= 40) ([place]) 10/03/2013  . Left hip pain 09/22/2013  . Skin lesions 04/13/2013  . Hypothyroidism 02/04/2012  . History of colon polyps 02/04/2012  . GERD (gastroesophageal reflux disease) 02/04/2012  . Hypercholesterolemia 02/04/2012  . Vitamin D deficiency 02/04/2012    Immunization History   Administered Date(s) Administered  . Influenza Split 12/03/2011, 11/12/2012, 11/13/2013  . Influenza, High Dose Seasonal PF 11/23/2017  . Influenza,inj,quad, With Preservative 11/17/2016, 10/23/2018  . Influenza-Unspecified 11/14/2014, 11/04/2015, 11/28/2019  . Moderna Sars-Covid-2 Vaccination 01/21/2019, 02/18/2019, 11/06/2019  . Pneumococcal Conjugate-13 04/24/2013  . Pneumococcal Polysaccharide-23 02/02/2012  . Tdap 03/10/2010  . Zoster 07/01/2008  . Zoster Recombinat (Shingrix) 12/14/2017, 03/13/2018, 06/20/2018    Conditions to be addressed/monitored:  Hyperlipidemia and impaired fasting glucose  Care Plan : Medication Management  Updates made by De Hollingshead, RPH-CPP since 03/02/2020 12:00 AM    Problem: Obesity, HLD, GERD     Long-Range Goal: Disease Progression Prevention   Recent Progress: On track  Priority: High  Note:   Current Barriers:  . Unable to achieve control of weight   Pharmacist Clinical Goal(s):  Marland Kitchen Over the next 90  days, patient will achieve 5-10% weight loss through collaboration with PharmD and provider.   Interventions: . 1:1 collaboration with Dale [place], MD regarding development and update of comprehensive plan of care as evidenced by provider attestation and co-signature . Inter-disciplinary care team collaboration (see longitudinal plan of care) . Comprehensive medication review performed; medication list updated in electronic medical record  Obesity with Impaired Fasting Glucose: Marland Kitchen Unable to achieve weight management goals with diet and exercise along; current treatment: Ozempic 0.25 mg weekly x 4 weeks . Little bit of headache, nausea a little bit, more generally right after Friday injection.  o S/p lap band 2006. [facility]. Hx phentermine therapy. Reported increased anxiety. Lost weight, but regained after discontinuation.  . Baseline weight: 260 lbs; most recent weight: 259 lbs  . Current meal  patterns: late breakfast: eggs, 3 orange wedges, sometimes saltines, sometimes toast, sometimes [employer] breakfast sandwiches; reports that she tries to focus on more proteins, coffee and water; 3 pm small snack: peanut butter crackers, leftovers; sometimes has a small handful of peanuts; special occasions will have a little bit of ice cream; evening snack: small portion of cheeze its; cups of diced peaches when she wants something sweet. Moderating portion sizes.  . Current physical activity: did get some exercise over the weekend - went to granddaughter's competition shoot and walked the course. Plans to move a little bit more during the week. Notes that she wakes up tired, but then naps during the day and doesn't sleep well at night. Discussed that increased physical activity could provide benefit in sleep quality over night, improving next morning energy.  . Discussed other sources of accountability. Denies having local friends/family that could walk with her regularly. . Previously discussed that Ozempic is being used off label. Patient agreeable to this decision. Increase to 0.5 mg weekly as previously discussed. Contact me if any concerns with worsening nausea that is troublesome.  . Discussed eventual goal 150 minutes moderate intensity exercise.  Hyperlipidemia: . Controlled per last lipid panel; current treatment: rosuvastatin 5 mg daily  . Recommended to continue current regimen at this time  Hypothyroidism, hx Graves disease s/p radioablation: . Controlled per last TSH; current regimen: levothyroxine 137 mcg daily . Recommend to continue current regimen at this time  GERD: . Controlled per patient report; current regimen: esomeprazole 40 mg daily; reports she occasionally forgets to take, but does not have concerns with worsened acid reflux when that happens.  . Continue current regimen at this time, along with avoidance of trigger foods.   Supplements: . Taking Vitamin D, plans to  start multivitamin (for energy) and fish oil (for dry eyes).   Patient Goals/Self-Care Activities . Over the next 90 days, patient will:  - target a minimum of 150 minutes of moderate intensity exercise weekly engage in dietary modifications by reduction in portion sizes, snacking  Follow Up Plan: Telephone follow up appointment with care management team member scheduled for: ~ 5 weeks       Medication Assistance: None required.  Patient affirms current coverage meets needs.  Patient's preferred pharmacy is:  [facility] - [place], [place] - [address] [place] [address] Phone: [phone] Fax: [phone]   Care Plan and Follow Up Patient Decision:  Patient agrees to Care Plan and Follow-up.  Plan: Telephone follow up appointment with care management team member scheduled for:  ~ 5 weeks  Catie Feliz Beam, PharmD, [place], CPP  Contractor at [place]

## 2020-03-02 NOTE — Patient Instructions (Signed)
Visit Information  PATIENT GOALS: Goals Addressed              This Visit's Progress     Patient Stated   .  Medication Monitoring (pt-stated)        Patient Goals/Self-Care Activities . Over the next 90 days, patient will:  - target a minimum of 150 minutes of moderate intensity exercise weekly engage in dietary modifications by reduction in portion sizes, snacking        Patient verbalizes understanding of instructions provided today and agrees to view in MyChart.   Plan: Telephone follow up appointment with care management team member scheduled for:  ~ 5 weeks  Catie Feliz Beam, PharmD, [place], CPP Clinical Pharmacist [employer] at [employer] [phone]

## 2020-03-08 ENCOUNTER — Ambulatory Visit: Payer: Medicare PPO | Admitting: Pharmacist

## 2020-03-08 ENCOUNTER — Telehealth: Payer: Self-pay | Admitting: Internal Medicine

## 2020-03-08 DIAGNOSIS — E7800 Pure hypercholesterolemia, unspecified: Secondary | ICD-10-CM

## 2020-03-08 DIAGNOSIS — E039 Hypothyroidism, unspecified: Secondary | ICD-10-CM

## 2020-03-08 DIAGNOSIS — R7301 Impaired fasting glucose: Secondary | ICD-10-CM

## 2020-03-08 NOTE — Telephone Encounter (Signed)
I will call the patient back when I have an opportunity in between patients.

## 2020-03-08 NOTE — Telephone Encounter (Signed)
Returned patient call. See CCM documentation 

## 2020-03-08 NOTE — Progress Notes (Signed)
Chronic Care Management Pharmacy Note  03/08/2020 Name:  Jodi Harris MRN:  [id number] DOB:  May 18, 1940  Subjective: Jodi Harris is an 80 y.o. year old female who is a primary patient of Einar Pheasant, MD.  The CCM team was consulted for assistance with disease management and care coordination needs.    Engaged with patient by telephone for in reponse to call to clinic with medication questions in response to provider referral for pharmacy case management and/or care coordination services.   Consent to Services:  The patient was given information about Chronic Care Management services, agreed to services, and gave verbal consent prior to initiation of services.  Please see initial visit note for detailed documentation.   Patient Care Team: Einar Pheasant, MD as PCP - General (Internal Medicine) De Hollingshead, RPH-CPP (Pharmacist)  Recent office visits: None since our last call  Recent consult visits: None since our last call  Hospital visits: None in previous 6 months  Objective:  Lab Results  Component Value Date   CREATININE 0.74 12/25/2019   BUN 13 12/25/2019   GFR 77.12 12/25/2019   NA 140 12/25/2019   K 4.1 12/25/2019   CALCIUM 8.9 12/25/2019   CO2 32 12/25/2019    Lab Results  Component Value Date/Time   GFR 77.12 12/25/2019 09:35 AM   GFR 62.92 03/03/2019 09:33 AM    Last diabetic Eye exam: No results found for: HMDIABEYEEXA  Last diabetic Foot exam: No results found for: HMDIABFOOTEX   Lab Results  Component Value Date   CHOL 131 12/25/2019   HDL 50.30 12/25/2019   LDLCALC 66 12/25/2019   LDLDIRECT 129.8 02/15/2012   TRIG 71.0 12/25/2019   CHOLHDL 3 12/25/2019    Hepatic Function Latest Ref Rng & Units 12/25/2019 03/03/2019 09/06/2018  Total Protein 6.0 - 8.3 g/dL 6.1 6.6 5.7(L)  Albumin 3.5 - 5.2 g/dL 3.6 3.8 3.5  AST 0 - 37 U/L _0 ALT 0 - 35 U/L _1 Alk Phosphatase 39 - 117 U/L 67 68 64  Total Bilirubin 0.2 -  1.2 mg/dL 0.6 0.6 0.5  Bilirubin, Direct 0.0 - 0.3 mg/dL 0.1 0.1 0.1    Lab Results  Component Value Date/Time   TSH 0.55 12/25/2019 09:35 AM   TSH 1.03 09/06/2018 09:30 AM    CBC Latest Ref Rng & Units 03/03/2019 10/18/2017 10/23/2016  WBC 4.0 - 10.5 K/uL 4.3 4.8 4.6  Hemoglobin 12.0 - 15.0 g/dL 12.9 13.2 13.4  Hematocrit 36.0 - 46.0 % 40.2 40.7 41.7  Platelets 150.0 - 400.0 K/uL 218.0 206.0 227.0    Lab Results  Component Value Date/Time   VD25OH 60.12 02/12/2017 09:33 AM   VD25OH 48.87 09/20/2015 10:56 AM      Depression screen [facility] 2/9 02/23/2020 12/29/2019 02/20/2019  Decreased Interest 0 0 0  Down, Depressed, Hopeless 0 0 0  PHQ - 2 Score 0 0 0    Social History   Tobacco Use  Smoking Status Never Smoker  Smokeless Tobacco Never Used  Tobacco Comment   smoked as a teenager   BP Readings from Last 3 Encounters:  12/29/19 120/64  09/09/18 (!) 120/58  07/29/18 (!) 114/49   Pulse Readings from Last 3 Encounters:  12/29/19 67  09/09/18 63  07/29/18 81   Wt Readings from Last 3 Encounters:  02/23/20 266 lb (120.7 kg)  12/29/19 266 lb (120.7 kg)  05/15/19 255 lb (115.7 kg)    Assessment/Interventions: Review of patient  past medical history, allergies, medications, health status, including review of consultants reports, laboratory and other test data, was performed as part of comprehensive evaluation and provision of chronic care management services.   SDOH:  (Social Determinants of Health) assessments and interventions performed: No   CCM Care Plan  Allergies  Allergen Reactions  . Codeine Sulfate   . Penicillins     Medications Reviewed Today    Reviewed by De Hollingshead, RPH-CPP (Pharmacist) on 03/08/20 at 1455  Med List Status: <None>  Medication Order Taking? Sig Documenting Provider Last Dose Status Informant  calcium carbonate (TUMS EX) 750 MG chewable tablet [id number]  Chew 1 tablet by mouth as needed.  [provider]  Active    Cholecalciferol (VITAMIN D-3 PO) [id number]  Take 2,000 Units by mouth daily. [provider]  Active   esomeprazole (NEXIUM) 40 MG capsule [id number]  Take 1 capsule (40 mg total) by mouth daily. Einar Pheasant, MD  Active   fluticasone [facility]) 50 MCG/ACT nasal spray [id number]  Place 2 sprays into both nostrils daily as needed. Einar Pheasant, MD  Active   levothyroxine (SYNTHROID) 137 MCG tablet [id number]  Take 1 tablet (137 mcg total) by mouth daily before breakfast. Einar Pheasant, MD  Active   rosuvastatin (CRESTOR) 5 MG tablet [id number]  Take 1 tablet (5 mg total) by mouth daily. Einar Pheasant, MD  Active   Semaglutide,0.25 or 0.5MG/DOS, (OZEMPIC, 0.25 OR 0.5 MG/DOSE,) 2 MG/1.5ML SOPN [id number] Yes Inject 0.5 mg into the skin once a week. Einar Pheasant, MD Taking Active           Patient Active Problem List   Diagnosis Date Noted  . Heel pain 01/05/2020  . Low back pain 12/29/2019  . Right hip pain 12/29/2019  . Dizziness 11/18/2018  . Right knee pain 05/04/2018  . Weight loss counseling, encounter for 05/04/2018  . Left leg pain 06/24/2017  . Health care maintenance 04/06/2014  . Stress 10/03/2013  . Severe obesity (BMI >= 40) ([place]) 10/03/2013  . Left hip pain 09/22/2013  . Skin lesions 04/13/2013  . Hypothyroidism 02/04/2012  . History of colon polyps 02/04/2012  . GERD (gastroesophageal reflux disease) 02/04/2012  . Hypercholesterolemia 02/04/2012  . Vitamin D deficiency 02/04/2012    Immunization History  Administered Date(s) Administered  . Influenza Split 12/03/2011, 11/12/2012, 11/13/2013  . Influenza, High Dose Seasonal PF 11/23/2017  . Influenza,inj,quad, With Preservative 11/17/2016, 10/23/2018  . Influenza-Unspecified 11/14/2014, 11/04/2015, 11/28/2019  . Moderna Sars-Covid-2 Vaccination 01/21/2019, 02/18/2019, 11/06/2019  . Pneumococcal Conjugate-13 04/24/2013  . Pneumococcal Polysaccharide-23 02/02/2012  . Tdap 03/10/2010  . Zoster  07/01/2008  . Zoster Recombinat (Shingrix) 12/14/2017, 03/13/2018, 06/20/2018    Conditions to be addressed/monitored:  Hyperlipidemia and obesity, GERD  Care Plan : Medication Management  Updates made by De Hollingshead, RPH-CPP since 03/08/2020 12:00 AM    Problem: Obesity, HLD, GERD     Long-Range Goal: Disease Progression Prevention   Recent Progress: On track  Priority: High  Note:   Current Barriers:  . Unable to achieve control of weight   Pharmacist Clinical Goal(s):  Marland Kitchen Over the next 90 days, patient will achieve 5-10% weight loss through collaboration with PharmD and provider.   Interventions: . 1:1 collaboration with Einar Pheasant, MD regarding development and update of comprehensive plan of care as evidenced by provider attestation and co-signature . Inter-disciplinary care team collaboration (see longitudinal plan of care) . Comprehensive medication review performed; medication list updated in electronic medical record  Health  Maintenance: . Calls today to report a 6 day history of nausea with 1 episode of vomiting, metallic taste in her mouth, headache (though had reported a headache to me before at our last visit), inability to get warm (reports temperature of 97.5), and fatigue. Home COVID test was negative. Wondering if related to [place].  Marland Kitchen Denies that symptoms worsened with dose increase of Ozempic last Friday. Discussed that while GI upset can be related to GLP1, other symptoms usually are not. Collaborated w/ LPN and PCP to advise on next steps.   Obesity with Impaired Fasting Glucose: Marland Kitchen Unable to achieve weight management goals with diet and exercise along; current treatment: Ozempic 0.5 mg weekly (increased on Friday) o S/p lap band 2006. [facility]. Hx phentermine therapy. Reported increased anxiety. Lost weight, but regained after discontinuation.  . Baseline weight: 260 lbs; most recent weight: 259 lbs  . Continue current  regimen as above  Hyperlipidemia: . Controlled per last lipid panel; current treatment: rosuvastatin 5 mg daily  . Recommended to continue current regimen at this time  Hypothyroidism, hx Graves disease s/p radioablation: . Controlled per last TSH; current regimen: levothyroxine 137 mcg daily . Recommend to continue current regimen at this time  GERD: . Controlled per patient report; current regimen: esomeprazole 40 mg daily; reports she occasionally forgets to take, but does not have concerns with worsened acid reflux when that happens.  . Continue current regimen at this time, along with avoidance of trigger foods.   Supplements: . Taking Vitamin D, plans to start multivitamin (for energy) and fish oil (for dry eyes).   Patient Goals/Self-Care Activities . Over the next 90 days, patient will:  - target a minimum of 150 minutes of moderate intensity exercise weekly engage in dietary modifications by reduction in portion sizes, snacking  Follow Up Plan: Telephone follow up appointment with care management team member scheduled for: ~ 3 weeks as previously scheduled        Medication Assistance: None required.  Patient affirms current coverage meets needs.  Patient's preferred pharmacy is:  [facility], [place] - [address] [address] Phone: [phone] Fax: [place] and Follow Up Patient Decision:  Patient agrees to Care Plan and Follow-up.  Plan: Telephone follow up appointment with care management team member scheduled for:  ~ 3 weeks as previously scheduled  Catie Darnelle Maffucci, PharmD, [place], [place] Clinical Pharmacist [employer] at [employer] [phone]

## 2020-03-08 NOTE — Telephone Encounter (Signed)
Patient called in to speak with you did not give reason for call

## 2020-03-08 NOTE — Patient Instructions (Signed)
Visit Information  PATIENT GOALS: Goals Addressed              This Visit's Progress     Patient Stated   .  Medication Monitoring (pt-stated)        Patient Goals/Self-Care Activities . Over the next 90 days, patient will:  - target a minimum of 150 minutes of moderate intensity exercise weekly engage in dietary modifications by reduction in portion sizes, snacking         The patient verbalized understanding of instructions, educational materials, and care plan provided today and declined offer to receive copy of patient instructions, educational materials, and care plan.  Plan: Telephone follow up appointment with care management team member scheduled for:  ~ 3 weeks as previously scheduled  Catie Feliz Beam, PharmD, [place], CPP Clinical Pharmacist [employer] at [employer] [phone]

## 2020-03-08 NOTE — Telephone Encounter (Signed)
Patient wants to speak to Jodi Harris regarding her Ozempic.

## 2020-03-08 NOTE — Telephone Encounter (Addendum)
Reports a 6 day history of metallic taste in mouth, lack of appetite, headache, reports a sensation of "not being able to get warm" but temperature was no more than 97.5, constant fatigue, nausea with 1 episode of vomiting. Took a home COVID test and was negative. Reports that she had hamburger today and it tasted correct, but coffee still doesn't "taste right". Does not feel like symptoms worsened with dose increase of Ozempic to 0.5 mg that she took on Friday.   Routing to PCP and LPN to advise.

## 2020-03-08 NOTE — Telephone Encounter (Signed)
Given symptoms, can schedule virtual visit - for evaluation.

## 2020-03-09 ENCOUNTER — Encounter: Payer: Self-pay | Admitting: Internal Medicine

## 2020-03-09 ENCOUNTER — Other Ambulatory Visit: Payer: Self-pay

## 2020-03-09 ENCOUNTER — Telehealth (INDEPENDENT_AMBULATORY_CARE_PROVIDER_SITE_OTHER): Payer: Medicare PPO | Admitting: Internal Medicine

## 2020-03-09 ENCOUNTER — Other Ambulatory Visit: Payer: Medicare PPO

## 2020-03-09 DIAGNOSIS — R5383 Other fatigue: Secondary | ICD-10-CM

## 2020-03-09 DIAGNOSIS — R059 Cough, unspecified: Secondary | ICD-10-CM

## 2020-03-09 NOTE — Telephone Encounter (Signed)
Pt scheduled for appt. COVID test ordered and lab appt scheduled/

## 2020-03-09 NOTE — Progress Notes (Signed)
Patient ID: Jodi Harris, female   DOB: 21-Aug-1940, 80 y.o.   MRN: [id number]   Virtual Visit via video Note  This visit type was conducted due to national recommendations for restrictions regarding the COVID-19 pandemic (e.g. social distancing).  This format is felt to be most appropriate for this patient at this time.  All issues noted in this document were discussed and addressed.  No physical exam was performed (except for noted visual exam findings with Video Visits).   I connected with Karolyna Velez by a video enabled telemedicine application and verified that I am speaking with the correct person using two identifiers. Location patient: home Location provider: work Persons participating in the virtual visit: patient, provider  The limitations, risks, security and privacy concerns of performing an evaluation and management service by video and the availability of in person appointments have been discussed.  It has also been discussed with the patient that there may be a patient responsible charge related to this service. The patient expressed understanding and agreed to proceed.  Reason for visit: work in appt  HPI: Work in with concerns regarding taste change, coughing and not feeling well.  Started ozempic recently - several weeks ago.  Increased dose Friday 03/05/20.  Had noticed a dull headache - before ozempic.  Was questioning if could be related to sinus symptoms.  Wednesday 03/03/20, noticed a metal taste in her mouth.  Nothing tasted well.  Tried to eat, but could not tolerate most foods.  Was cold - chilled.  Was able to eat some toast, chicken noodle soup and rice.  Unsure if had fever.  Face felt warm.  No documented fever.  Emesis am Wednesday, but no other vomiting.  Noticed some sneezing and nasal dripping.  No sore throat. Some cough - occasionally productive of yellow mucus.  No sob.  No chest pain.  No sore throat.  Does feel some better today.  Was able to eat hamburger and  cheese toast in the last 24 hours.  Has no known direct covid exposures.     ROS: See pertinent positives and negatives per HPI.  Past Medical History:  Diagnosis Date  . Anemia   . Diverticulosis   . GERD (gastroesophageal reflux disease)   . History of colonic polyps   . Hypercholesterolemia   . Hypothyroidism   . Internal hemorrhoids   . Thrombophlebitis    varicosities    Past Surgical History:  Procedure Laterality Date  . COLON SURGERY     laparoscopic assisted transverse colectomy for tubular adenomatous polyp  . COLONOSCOPY    . COLONOSCOPY WITH PROPOFOL N/A 07/29/2018   Procedure: COLONOSCOPY WITH PROPOFOL;  Surgeon: Christena Deem, MD;  Location: [facility] ENDOSCOPY;  Service: Endoscopy;  Laterality: N/A;  . DIAGNOSTIC LAPAROSCOPY    . LAPAROSCOPIC GASTRIC BANDING    . URETHRAL DIVERTICULUM REPAIR  1974   Dr Jenne Pane    Family History  Problem Relation Age of Onset  . Cancer Father        lymphoma  . Cancer Maternal Aunt        Breast  . Breast cancer Maternal Aunt 50  . Colon cancer Other        one relative on maternal side    SOCIAL HX: reviewed.    Current Outpatient Medications:  .  Cholecalciferol (VITAMIN D-3 PO), Take 2,000 Units by mouth daily., Disp: , Rfl:  .  esomeprazole (NEXIUM) 40 MG capsule, Take 1 capsule (40 mg total) by  mouth daily., Disp: 90 capsule, Rfl: 1 .  fluticasone (FLONASE) 50 MCG/ACT nasal spray, Place 2 sprays into both nostrils daily as needed., Disp: 16 g, Rfl: 5 .  levothyroxine (SYNTHROID) 137 MCG tablet, Take 1 tablet (137 mcg total) by mouth daily before breakfast., Disp: 90 tablet, Rfl: 1 .  rosuvastatin (CRESTOR) 5 MG tablet, Take 1 tablet (5 mg total) by mouth daily., Disp: 90 tablet, Rfl: 2 .  Semaglutide,0.25 or 0.5MG /DOS, (OZEMPIC, 0.25 OR 0.5 MG/DOSE,) 2 MG/1.5ML SOPN, Inject 0.5 mg into the skin once a week., Disp: 1.5 mL, Rfl: 2  EXAM:  GENERAL: alert, oriented, appears well and in no acute distress  HEENT:  atraumatic, conjunttiva clear, no obvious abnormalities on inspection of external nose and ears  NECK: normal movements of the head and neck  LUNGS: on inspection no signs of respiratory distress, breathing rate appears normal, no obvious gross SOB, gasping or wheezing  CV: no obvious cyanosis  PSYCH/NEURO: pleasant and cooperative, no obvious depression or anxiety, speech and thought processing grossly intact  ASSESSMENT AND PLAN:  Discussed the following assessment and plan:  Problem List Items Addressed This Visit    Cough    Recent symptoms:  Cough, taste change, previous chills and decreased appetite.  Symptoms started before the increase dose of ozempic. She was questioning if could be related to ozempic.  Also discussed possible infectious etiology.  Discussed possible covid.  covid swab results pending.  Discussed quarantine until results back.  Discussed saline nasal spray, steroid nasal spray if needed.  Also discussed robitussin DM/mucinex.  Rest.  Fluids.  Advance diet as tolerated.  Call with update.           I discussed the assessment and treatment plan with the patient. The patient was provided an opportunity to ask questions and all were answered. The patient agreed with the plan and demonstrated an understanding of the instructions.   The patient was advised to call back or seek an in-person evaluation if the symptoms worsen or if the condition fails to improve as anticipated.   Dale Lenox, MD

## 2020-03-09 NOTE — Progress Notes (Unsigned)
COVID swab ordered

## 2020-03-10 ENCOUNTER — Encounter: Payer: Self-pay | Admitting: Internal Medicine

## 2020-03-10 DIAGNOSIS — R059 Cough, unspecified: Secondary | ICD-10-CM | POA: Insufficient documentation

## 2020-03-10 NOTE — Assessment & Plan Note (Signed)
Recent symptoms:  Cough, taste change, previous chills and decreased appetite.  Symptoms started before the increase dose of ozempic. She was questioning if could be related to ozempic.  Also discussed possible infectious etiology.  Discussed possible covid.  covid swab results pending.  Discussed quarantine until results back.  Discussed saline nasal spray, steroid nasal spray if needed.  Also discussed robitussin DM/mucinex.  Rest.  Fluids.  Advance diet as tolerated.  Call with update.

## 2020-03-11 ENCOUNTER — Encounter: Payer: Medicare PPO | Admitting: Physical Therapy

## 2020-03-11 LAB — NOVEL CORONAVIRUS, NAA: SARS-CoV-2, NAA: NOT DETECTED

## 2020-03-11 LAB — SARS-COV-2, NAA 2 DAY TAT

## 2020-03-25 ENCOUNTER — Telehealth: Payer: Self-pay

## 2020-03-25 ENCOUNTER — Ambulatory Visit: Payer: Medicare PPO | Admitting: Pharmacist

## 2020-03-25 DIAGNOSIS — E7800 Pure hypercholesterolemia, unspecified: Secondary | ICD-10-CM

## 2020-03-25 DIAGNOSIS — R7301 Impaired fasting glucose: Secondary | ICD-10-CM

## 2020-03-25 NOTE — Progress Notes (Signed)
Spoke with patient today to move her f/u appt with Pharm D, and Patient states that she feels her ozempic is making her sick. Patient would like to speak to Catie Pharm D Asap in reference to this.   Thank you  Penne Lash, RMA Care Guide, Embedded Care Coordination [facility]  [place], [place] [address] Direct Dial: [phone] [email or web address] Website: [email or web address]

## 2020-03-25 NOTE — Patient Instructions (Signed)
Visit Information  PATIENT GOALS: Goals Addressed              This Visit's Progress     Patient Stated   .  Medication Monitoring (pt-stated)        Patient Goals/Self-Care Activities . Over the next 90 days, patient will:  - target a minimum of 150 minutes of moderate intensity exercise weekly engage in dietary modifications by reduction in portion sizes, snacking          Patient verbalizes understanding of instructions provided today and agrees to view in MyChart.   Plan: Telephone follow up appointment with care management team member scheduled for:  ~ 2 weeks as previously scheduled  Catie Feliz Beam, PharmD, [place], CPP Clinical Pharmacist [employer] at [facility] [phone]

## 2020-03-25 NOTE — Chronic Care Management (AMB) (Signed)
Chronic Care Management Pharmacy Note  03/25/2020 Name:  Jodi Harris MRN:  [id number] DOB:  28-Mar-1940  Subjective: Jodi Harris is an 80 y.o. year old female who is a primary patient of Einar Pheasant, MD.  The CCM team was consulted for assistance with disease management and care coordination needs.    Engaged with patient by telephone for response to call about medication management in response to provider referral for pharmacy case management and/or care coordination services.   Consent to Services:  The patient was given information about Chronic Care Management services, agreed to services, and gave verbal consent prior to initiation of services.  Please see initial visit note for detailed documentation.   Patient Care Team: Einar Pheasant, MD as PCP - General (Internal Medicine) De Hollingshead, RPH-CPP (Pharmacist)  Recent office visits:  3/1- PCP video visit, COVID negative  Recent consult visits: None since our last call  Hospital visits: None in previous 6 months  Objective:  Lab Results  Component Value Date   CREATININE 0.74 12/25/2019   CREATININE 0.87 03/03/2019   CREATININE 0.72 09/06/2018    No results found for: HGBA1C Last diabetic Eye exam: No results found for: HMDIABEYEEXA  Last diabetic Foot exam: No results found for: HMDIABFOOTEX      Component Value Date/Time   CHOL 131 12/25/2019 0935   TRIG 71.0 12/25/2019 0935   HDL 50.30 12/25/2019 0935   CHOLHDL 3 12/25/2019 0935   VLDL 14.2 12/25/2019 0935   LDLCALC 66 12/25/2019 0935   LDLDIRECT 129.8 02/15/2012 0912    Hepatic Function Latest Ref Rng & Units 12/25/2019 03/03/2019 09/06/2018  Total Protein 6.0 - 8.3 g/dL 6.1 6.6 5.7(L)  Albumin 3.5 - 5.2 g/dL 3.6 3.8 3.5  AST 0 - 37 U/L _0 ALT 0 - 35 U/L _1 Alk Phosphatase 39 - 117 U/L 67 68 64  Total Bilirubin 0.2 - 1.2 mg/dL 0.6 0.6 0.5  Bilirubin, Direct 0.0 - 0.3 mg/dL 0.1 0.1 0.1    Lab Results   Component Value Date/Time   TSH 0.55 12/25/2019 09:35 AM   TSH 1.03 09/06/2018 09:30 AM    CBC Latest Ref Rng & Units 03/03/2019 10/18/2017 10/23/2016  WBC 4.0 - 10.5 K/uL 4.3 4.8 4.6  Hemoglobin 12.0 - 15.0 g/dL 12.9 13.2 13.4  Hematocrit 36.0 - 46.0 % 40.2 40.7 41.7  Platelets 150.0 - 400.0 K/uL 218.0 206.0 227.0    Lab Results  Component Value Date/Time   VD25OH 60.12 02/12/2017 09:33 AM   VD25OH 48.87 09/20/2015 10:56 AM    Clinical ASCVD: No    Social History   Tobacco Use  Smoking Status Never Smoker  Smokeless Tobacco Never Used  Tobacco Comment   smoked as a teenager   BP Readings from Last 3 Encounters:  12/29/19 120/64  09/09/18 (!) 120/58  07/29/18 (!) 114/49   Pulse Readings from Last 3 Encounters:  12/29/19 67  09/09/18 63  07/29/18 81   Wt Readings from Last 3 Encounters:  03/09/20 258 lb (117 kg)  02/23/20 266 lb (120.7 kg)  12/29/19 266 lb (120.7 kg)    Assessment: Review of patient past medical history, allergies, medications, health status, including review of consultants reports, laboratory and other test data, was performed as part of comprehensive evaluation and provision of chronic care management services.   SDOH:  (Social Determinants of Health) assessments and interventions performed:    CCM Care Plan  Allergies  Allergen  Reactions  . Codeine Sulfate   . Penicillins     Medications Reviewed Today    Reviewed by Einar Pheasant, MD (Physician) on 03/10/20 at [place] List Status: <None>  Medication Order Taking? Sig Documenting Provider Last Dose Status Informant  calcium carbonate (TUMS EX) 750 MG chewable tablet [id number] No Chew 1 tablet by mouth as needed.   Patient not taking: Reported on 03/09/2020   [provider] Not Taking Active   Cholecalciferol (VITAMIN D-3 PO) [id number] Yes Take 2,000 Units by mouth daily. [provider] Taking Active   esomeprazole (NEXIUM) 40 MG capsule [id number] Yes Take 1  capsule (40 mg total) by mouth daily. Einar Pheasant, MD Taking Active   fluticasone [facility]) 50 MCG/ACT nasal spray [id number] Yes Place 2 sprays into both nostrils daily as needed. Einar Pheasant, MD Taking Active   levothyroxine (SYNTHROID) 137 MCG tablet [id number] Yes Take 1 tablet (137 mcg total) by mouth daily before breakfast. Einar Pheasant, MD Taking Active   rosuvastatin (CRESTOR) 5 MG tablet [id number] Yes Take 1 tablet (5 mg total) by mouth daily. Einar Pheasant, MD Taking Active   Semaglutide,0.25 or 0.5MG/DOS, (OZEMPIC, 0.25 OR 0.5 MG/DOSE,) 2 MG/1.5ML SOPN [id number] Yes Inject 0.5 mg into the skin once a week. Einar Pheasant, MD Taking Active           Patient Active Problem List   Diagnosis Date Noted  . Cough 03/10/2020  . Heel pain 01/05/2020  . Low back pain 12/29/2019  . Right hip pain 12/29/2019  . Dizziness 11/18/2018  . Right knee pain 05/04/2018  . Weight loss counseling, encounter for 05/04/2018  . Left leg pain 06/24/2017  . Health care maintenance 04/06/2014  . Stress 10/03/2013  . Severe obesity (BMI >= 40) ([place]) 10/03/2013  . Left hip pain 09/22/2013  . Skin lesions 04/13/2013  . Hypothyroidism 02/04/2012  . History of colon polyps 02/04/2012  . GERD (gastroesophageal reflux disease) 02/04/2012  . Hypercholesterolemia 02/04/2012  . Vitamin D deficiency 02/04/2012    Immunization History  Administered Date(s) Administered  . Influenza Split 12/03/2011, 11/12/2012, 11/13/2013  . Influenza, High Dose Seasonal PF 11/23/2017  . Influenza,inj,quad, With Preservative 11/17/2016, 10/23/2018  . Influenza-Unspecified 11/14/2014, 11/04/2015, 11/28/2019  . Moderna Sars-Covid-2 Vaccination 01/21/2019, 02/18/2019, 11/06/2019  . Pneumococcal Conjugate-13 04/24/2013  . Pneumococcal Polysaccharide-23 02/02/2012  . Tdap 03/10/2010  . Zoster 07/01/2008  . Zoster Recombinat (Shingrix) 12/14/2017, 03/13/2018, 06/20/2018    Conditions to be  addressed/monitored: HLD and obesity, GERD  Care Plan : Medication Management  Updates made by De Hollingshead, RPH-CPP since 03/25/2020 12:00 AM    Problem: Obesity, HLD, GERD     Long-Range Goal: Disease Progression Prevention   This Visit's Progress: On track  Recent Progress: On track  Priority: High  Note:   Current Barriers:  . Unable to achieve control of weight   Pharmacist Clinical Goal(s):  Marland Kitchen Over the next 90 days, patient will achieve 5-10% weight loss through collaboration with PharmD and provider.   Interventions: . 1:1 collaboration with Einar Pheasant, MD regarding development and update of comprehensive plan of care as evidenced by provider attestation and co-signature . Inter-disciplinary care team collaboration (see longitudinal plan of care) . Comprehensive medication review performed; medication list updated in electronic medical record  Obesity with Impaired Fasting Glucose: Marland Kitchen Unable to achieve weight management goals with diet and exercise along; current treatment: Ozempic 0.5 mg weekly  o S/p lap band 2006. [facility]. Hx phentermine  therapy. Reported increased anxiety. Lost weight, but regained after discontinuation.  . Baseline weight: 260 lbs; most recent weight: 259 lbs  . Calls to report continued occasional stomach upset, continued metallic taste in her mouth, 2 episodes of projectile vomiting last night. COVID negative.  . Discontinue Ozempic. Will f/u at scheduled appointment in 2 weeks to evaluate.   Hyperlipidemia: . Controlled per last lipid panel; current treatment: rosuvastatin 5 mg daily  . Recommended to continue current regimen at this time  Hypothyroidism, hx Graves disease s/p radioablation: . Controlled per last TSH; current regimen: levothyroxine 137 mcg daily . Recommend to continue current regimen at this time  GERD: . Controlled per patient report; current regimen: esomeprazole 40 mg daily; reports  she occasionally forgets to take, but does not have concerns with worsened acid reflux when that happens.  . Continue current regimen at this time, along with avoidance of trigger foods.   Supplements: . Taking Vitamin D, plans to start multivitamin (for energy) and fish oil (for dry eyes).   Patient Goals/Self-Care Activities . Over the next 90 days, patient will:  - target a minimum of 150 minutes of moderate intensity exercise weekly engage in dietary modifications by reduction in portion sizes, snacking  Follow Up Plan: Telephone follow up appointment with care management team member scheduled for: ~ 2 weeks as previously scheduled       Medication Assistance: None required.  Patient affirms current coverage meets needs.  Patient's preferred pharmacy is:  [place], [place] - [address] [place] [address] Phone: [phone] Fax: [phone]  Follow Up:  Patient agrees to Care Plan and Follow-up.  Plan: Telephone follow up appointment with care management team member scheduled for:  ~ 2 weeks as previously scheduled  Catie Darnelle Maffucci, PharmD, [place], [place] Clinical Pharmacist [employer] at [facility] [phone]

## 2020-03-25 NOTE — Telephone Encounter (Signed)
Received notice from scheduler that patient had questions about Ozempic making her feel bad. See CCM note

## 2020-04-07 ENCOUNTER — Telehealth: Payer: Medicare PPO

## 2020-04-07 ENCOUNTER — Ambulatory Visit (INDEPENDENT_AMBULATORY_CARE_PROVIDER_SITE_OTHER): Payer: Medicare PPO | Admitting: Pharmacist

## 2020-04-07 DIAGNOSIS — E7800 Pure hypercholesterolemia, unspecified: Secondary | ICD-10-CM | POA: Diagnosis not present

## 2020-04-07 DIAGNOSIS — E039 Hypothyroidism, unspecified: Secondary | ICD-10-CM

## 2020-04-07 DIAGNOSIS — R7301 Impaired fasting glucose: Secondary | ICD-10-CM

## 2020-04-07 NOTE — Chronic Care Management (AMB) (Signed)
**Note Jodi-Identified via Obfuscation** Chronic Care Management Pharmacy Note  04/07/2020 Name:  Jodi Harris MRN:  [id number] DOB:  [birth year]  Subjective: Jodi Harris is an 80 y.o. year old female who is a primary patient of Einar Pheasant, MD.  The CCM team was consulted for assistance with disease management and care coordination needs.    Engaged with patient by telephone for follow up visit in response to provider referral for pharmacy case management and/or care coordination services.   Consent to Services:  The patient was given information about Chronic Care Management services, agreed to services, and gave verbal consent prior to initiation of services.  Please see initial visit note for detailed documentation.   Patient Care Team: Einar Pheasant, MD as PCP - General (Internal Medicine) Jodi Harris, RPH-CPP (Pharmacist)  Recent office visits: None since our last call  Recent consult visits: None since our last call  Hospital visits: None in previous 6 months  Objective:  Lab Results  Component Value Date   CREATININE 0.74 12/25/2019   CREATININE 0.87 03/03/2019   CREATININE 0.72 09/06/2018       Component Value Date/Time   CHOL 131 12/25/2019 0935   TRIG 71.0 12/25/2019 0935   HDL 50.30 12/25/2019 0935   CHOLHDL 3 12/25/2019 0935   VLDL 14.2 12/25/2019 0935   LDLCALC 66 12/25/2019 0935   LDLDIRECT 129.8 02/15/2012 0912    Hepatic Function Latest Ref Rng & Units 12/25/2019 03/03/2019 09/06/2018  Total Protein 6.0 - 8.3 g/dL 6.1 6.6 5.7(L)  Albumin 3.5 - 5.2 g/dL 3.6 3.8 3.5  AST 0 - 37 U/L 15 14 13   ALT 0 - 35 U/L 9 9 7   Alk Phosphatase 39 - 117 U/L 67 68 64  Total Bilirubin 0.2 - 1.2 mg/dL 0.6 0.6 0.5  Bilirubin, Direct 0.0 - 0.3 mg/dL 0.1 0.1 0.1    Lab Results  Component Value Date/Time   TSH 0.55 12/25/2019 09:35 AM   TSH 1.03 09/06/2018 09:30 AM    CBC Latest Ref Rng & Units 03/03/2019 10/18/2017 10/23/2016  WBC 4.0 - 10.5 K/uL 4.3 4.8 4.6  Hemoglobin  12.0 - 15.0 g/dL 12.9 13.2 13.4  Hematocrit 36.0 - 46.0 % 40.2 40.7 41.7  Platelets 150.0 - 400.0 K/uL 218.0 206.0 227.0    Lab Results  Component Value Date/Time   VD25OH 60.12 02/12/2017 09:33 AM   VD25OH 48.87 09/20/2015 10:56 AM    Clinical ASCVD: No    Social History   Tobacco Use  Smoking Status Never Smoker  Smokeless Tobacco Never Used  Tobacco Comment   smoked as a teenager   BP Readings from Last 3 Encounters:  12/29/19 120/64  09/09/18 (!) 120/58  07/29/18 (!) 114/49   Pulse Readings from Last 3 Encounters:  12/29/19 67  09/09/18 63  07/29/18 81   Wt Readings from Last 3 Encounters:  03/09/20 258 lb (117 kg)  02/23/20 266 lb (120.7 kg)  12/29/19 266 lb (120.7 kg)    Assessment: Review of patient past medical history, allergies, medications, health status, including review of consultants reports, laboratory and other test data, was performed as part of comprehensive evaluation and provision of chronic care management services.   SDOH:  (Social Determinants of Health) assessments and interventions performed:  SDOH Interventions   Flowsheet Row Most Recent Value  SDOH Interventions   Financial Strain Interventions Intervention Not Indicated      CCM Care Plan  Allergies  Allergen Reactions  . Codeine Sulfate   .  Penicillins     Medications Reviewed Today    Reviewed by Jodi Harris, RPH-CPP (Pharmacist) on 04/07/20 at 1012  Med List Status: <None>  Medication Order Taking? Sig Documenting Provider Last Dose Status Informant  Cholecalciferol (VITAMIN D-3 PO) [id number] Yes Take 2,000 Units by mouth daily. [provider] Taking Active   esomeprazole (NEXIUM) 40 MG capsule [id number] Yes Take 1 capsule (40 mg total) by mouth daily. Einar Pheasant, MD Taking Active   fluticasone [facility]) 50 MCG/ACT nasal spray [id number] Yes Place 2 sprays into both nostrils daily as needed. Einar Pheasant, MD Taking Active   levothyroxine (SYNTHROID)  137 MCG tablet [id number] Yes Take 1 tablet (137 mcg total) by mouth daily before breakfast. Einar Pheasant, MD Taking Active   rosuvastatin (CRESTOR) 5 MG tablet [id number] Yes Take 1 tablet (5 mg total) by mouth daily. Einar Pheasant, MD Taking Active   Semaglutide,0.25 or 0.5MG/DOS, (OZEMPIC, 0.25 OR 0.5 MG/DOSE,) 2 MG/1.5ML SOPN [id number] No Inject 0.5 mg into the skin once a week.  Patient not taking: Reported on 04/07/2020   Einar Pheasant, MD Not Taking Active           Patient Active Problem List   Diagnosis Date Noted  . Cough 03/10/2020  . Heel pain 01/05/2020  . Low back pain 12/29/2019  . Right hip pain 12/29/2019  . Dizziness 11/18/2018  . Right knee pain 05/04/2018  . Weight loss counseling, encounter for 05/04/2018  . Left leg pain 06/24/2017  . Health care maintenance 04/06/2014  . Stress 10/03/2013  . Severe obesity (BMI >= 40) ([place]) 10/03/2013  . Left hip pain 09/22/2013  . Skin lesions 04/13/2013  . Hypothyroidism 02/04/2012  . History of colon polyps 02/04/2012  . GERD (gastroesophageal reflux disease) 02/04/2012  . Hypercholesterolemia 02/04/2012  . Vitamin D deficiency 02/04/2012    Immunization History  Administered Date(s) Administered  . Influenza Split 12/03/2011, 11/12/2012, 11/13/2013  . Influenza, High Dose Seasonal PF 11/23/2017  . Influenza,inj,quad, With Preservative 11/17/2016, 10/23/2018  . Influenza-Unspecified 11/14/2014, 11/04/2015, 11/28/2019  . Moderna Sars-Covid-2 Vaccination 01/21/2019, 02/18/2019, 11/06/2019  . Pneumococcal Conjugate-13 04/24/2013  . Pneumococcal Polysaccharide-23 02/02/2012  . Tdap 03/10/2010  . Zoster 07/01/2008  . Zoster Recombinat (Shingrix) 12/14/2017, 03/13/2018, 06/20/2018    Conditions to be addressed/monitored: HLD and GERD, impaired fasting glucose, obesity  Care Plan : Medication Management  Updates made by Jodi Harris, RPH-CPP since 04/07/2020 12:00 AM    Problem: Obesity, HLD, GERD      Long-Range Goal: Disease Progression Prevention   This Visit's Progress: On track  Recent Progress: On track  Priority: High  Note:   Current Barriers:  . Unable to achieve control of weight   Pharmacist Clinical Goal(s):  Marland Kitchen Over the next 90 days, patient will achieve 5-10% weight loss through collaboration with PharmD and provider.   Interventions: . 1:1 collaboration with Einar Pheasant, MD regarding development and update of comprehensive plan of care as evidenced by provider attestation and co-signature . Inter-disciplinary care team collaboration (see longitudinal plan of care) . Comprehensive medication review performed; medication list updated in electronic medical record  Obesity with Impaired Fasting Glucose: Marland Kitchen Unable to achieve weight management goals with diet and exercise along; current treatment: none- holding Ozempic o S/p lap band 2006. [facility]. Hx phentermine therapy. Reported increased anxiety. Lost weight, but regained after discontinuation.  . Baseline weight: 260 lbs; most recent weight: 259 lbs  . Reports resolution of GI symptoms since discontinuation of  Ozempic. However, she wonders if her symptoms could have been infections given fever, headaches, projectile vomiting. Has a supply of Ozempic at home and requests to retrial.  . Counseled on normal vs abnormal side effects of GLP1 therapy. Noted that a sensation of fullness, sometimes queasiness can be apparent when first starting the medication in the days following injection. If severe nausea, vomiting occurs, discontinue medication and call me. Patient in agreement.  [place] if any foods should be avoided. Noted that evidence does not point to anything that increases nausea w/ GLP1 therapy, but it may be appropriate to minimize greasy/fatty foods that are more likely to cause GERD which could also cause stomach upset. Continue PPI therapy as already prescribed.    Hyperlipidemia: . Controlled per last lipid panel; current treatment: rosuvastatin 5 mg daily  . Recommended to continue current regimen at this time  Hypothyroidism, hx Graves disease s/p radioablation: . Controlled per last TSH; current regimen: levothyroxine 137 mcg daily . Recommend to continue current regimen at this time  GERD: . Controlled per patient report; current regimen: esomeprazole 40 mg daily;  . Continue current regimen at this time, along with avoidance of trigger foods.   Supplements: . Taking Vitamin D, plans to start multivitamin (for energy) and fish oil (for dry eyes).   Patient Goals/Self-Care Activities . Over the next 90 days, patient will:  - target a minimum of 150 minutes of moderate intensity exercise weekly engage in dietary modifications by reduction in portion sizes, snacking - Contact our office with questions or concerns.   Follow Up Plan: Telephone follow up appointment with care management team member scheduled for: ~ 3 weeks      Medication Assistance: None required.  Patient affirms current coverage meets needs.  Patient's preferred pharmacy is:  [place], [place] - [address] [place] [address] Phone: [phone] Fax: [phone]   Follow Up:  Patient agrees to Care Plan and Follow-up.  Plan: Telephone follow up appointment with care management team member scheduled for:  ~ 3 weeks  Catie Darnelle Maffucci, PharmD, [place], [place] Clinical Pharmacist [employer] at [employer] [phone]

## 2020-04-07 NOTE — Patient Instructions (Signed)
Visit Information  PATIENT GOALS: Goals Addressed              This Visit's Progress     Patient Stated   .  Medication Monitoring (pt-stated)        Patient Goals/Self-Care Activities . Over the next 90 days, patient will:  - target a minimum of 150 minutes of moderate intensity exercise weekly engage in dietary modifications by reduction in portion sizes, snacking - communicate concerns with care team          The patient verbalized understanding of instructions, educational materials, and care plan provided today and declined offer to receive copy of patient instructions, educational materials, and care plan.  Plan: Telephone follow up appointment with care management team member scheduled for:  ~ 3 weeks  Catie Feliz Beam, PharmD, [place], CPP Clinical Pharmacist [employer] at [employer] [phone]

## 2020-04-27 ENCOUNTER — Ambulatory Visit (INDEPENDENT_AMBULATORY_CARE_PROVIDER_SITE_OTHER): Payer: Medicare PPO | Admitting: Pharmacist

## 2020-04-27 DIAGNOSIS — Z6841 Body Mass Index (BMI) 40.0 and over, adult: Secondary | ICD-10-CM

## 2020-04-27 DIAGNOSIS — R7301 Impaired fasting glucose: Secondary | ICD-10-CM | POA: Diagnosis not present

## 2020-04-27 DIAGNOSIS — E7800 Pure hypercholesterolemia, unspecified: Secondary | ICD-10-CM | POA: Diagnosis not present

## 2020-04-27 NOTE — Chronic Care Management (AMB) (Signed)
Chronic Care Management Pharmacy Note  04/27/2020 Name:  Nazirah Tri Pownall MRN:  [id number] DOB:  04/17/1940  Subjective: Janalee Dane Dirks is an 80 y.o. year old female who is a primary patient of Einar Pheasant, MD.  The CCM team was consulted for assistance with disease management and care coordination needs.    Engaged with patient by telephone for follow up visit in response to provider referral for pharmacy case management and/or care coordination services.   Consent to Services:  The patient was given information about Chronic Care Management services, agreed to services, and gave verbal consent prior to initiation of services.  Please see initial visit note for detailed documentation.   Patient Care Team: Einar Pheasant, MD as PCP - General (Internal Medicine) De Hollingshead, RPH-CPP (Pharmacist)  Recent office visits: None since our last call  Recent consult visits: None since our last call  Hospital visits: None in previous 6 months  Objective:  Lab Results  Component Value Date   CREATININE 0.74 12/25/2019   CREATININE 0.87 03/03/2019   CREATININE 0.72 09/06/2018    No results found for: HGBA1C Last diabetic Eye exam: No results found for: HMDIABEYEEXA  Last diabetic Foot exam: No results found for: HMDIABFOOTEX      Component Value Date/Time   CHOL 131 12/25/2019 0935   TRIG 71.0 12/25/2019 0935   HDL 50.30 12/25/2019 0935   CHOLHDL 3 12/25/2019 0935   VLDL 14.2 12/25/2019 0935   LDLCALC 66 12/25/2019 0935   LDLDIRECT 129.8 02/15/2012 0912    Hepatic Function Latest Ref Rng & Units 12/25/2019 03/03/2019 09/06/2018  Total Protein 6.0 - 8.3 g/dL 6.1 6.6 5.7(L)  Albumin 3.5 - 5.2 g/dL 3.6 3.8 3.5  AST 0 - 37 U/L 15 14 13   ALT 0 - 35 U/L 9 9 7   Alk Phosphatase 39 - 117 U/L 67 68 64  Total Bilirubin 0.2 - 1.2 mg/dL 0.6 0.6 0.5  Bilirubin, Direct 0.0 - 0.3 mg/dL 0.1 0.1 0.1    Lab Results  Component Value Date/Time   TSH 0.55 12/25/2019  09:35 AM   TSH 1.03 09/06/2018 09:30 AM    CBC Latest Ref Rng & Units 03/03/2019 10/18/2017 10/23/2016  WBC 4.0 - 10.5 K/uL 4.3 4.8 4.6  Hemoglobin 12.0 - 15.0 g/dL 12.9 13.2 13.4  Hematocrit 36.0 - 46.0 % 40.2 40.7 41.7  Platelets 150.0 - 400.0 K/uL 218.0 206.0 227.0    Lab Results  Component Value Date/Time   VD25OH 60.12 02/12/2017 09:33 AM   VD25OH 48.87 09/20/2015 10:56 AM    Clinical ASCVD: No  The ASCVD Risk score (Rodeo., et al., 2013) failed to calculate for the following reasons:   The systolic blood pressure is missing     Social History   Tobacco Use  Smoking Status Never Smoker  Smokeless Tobacco Never Used  Tobacco Comment   smoked as a teenager   BP Readings from Last 3 Encounters:  12/29/19 120/64  09/09/18 (!) 120/58  07/29/18 (!) 114/49   Pulse Readings from Last 3 Encounters:  12/29/19 67  09/09/18 63  07/29/18 81   Wt Readings from Last 3 Encounters:  03/09/20 258 lb (117 kg)  02/23/20 266 lb (120.7 kg)  12/29/19 266 lb (120.7 kg)    Assessment: Review of patient past medical history, allergies, medications, health status, including review of consultants reports, laboratory and other test data, was performed as part of comprehensive evaluation and provision of chronic care management services.  SDOH:  (Social Determinants of Health) assessments and interventions performed: none today   CCM Care Plan  Allergies  Allergen Reactions  . Codeine Sulfate   . Penicillins     Medications Reviewed Today    Reviewed by De Hollingshead, RPH-CPP (Pharmacist) on 04/27/20 at 1159  Med List Status: <None>  Medication Order Taking? Sig Documenting Provider Last Dose Status Informant  Cholecalciferol (VITAMIN D-3 PO) [id number] Yes Take 2,000 Units by mouth daily. [provider] Taking Active   esomeprazole (NEXIUM) 40 MG capsule [id number] Yes Take 1 capsule (40 mg total) by mouth daily. Einar Pheasant, MD Taking Active    fluticasone [facility]) 50 MCG/ACT nasal spray [id number] Yes Place 2 sprays into both nostrils daily as needed. Einar Pheasant, MD Taking Active   levothyroxine (SYNTHROID) 137 MCG tablet [id number] Yes Take 1 tablet (137 mcg total) by mouth daily before breakfast. Einar Pheasant, MD Taking Active   rosuvastatin (CRESTOR) 5 MG tablet [id number] Yes Take 1 tablet (5 mg total) by mouth daily. Einar Pheasant, MD Taking Active   Semaglutide,0.25 or 0.5MG/DOS, (OZEMPIC, 0.25 OR 0.5 MG/DOSE,) 2 MG/1.5ML SOPN [id number] Yes Inject 0.5 mg into the skin once a week. Einar Pheasant, MD Taking Active           Patient Active Problem List   Diagnosis Date Noted  . Cough 03/10/2020  . Heel pain 01/05/2020  . Low back pain 12/29/2019  . Right hip pain 12/29/2019  . Dizziness 11/18/2018  . Right knee pain 05/04/2018  . Weight loss counseling, encounter for 05/04/2018  . Left leg pain 06/24/2017  . Health care maintenance 04/06/2014  . Stress 10/03/2013  . Severe obesity (BMI >= 40) ([place]) 10/03/2013  . Left hip pain 09/22/2013  . Skin lesions 04/13/2013  . Hypothyroidism 02/04/2012  . History of colon polyps 02/04/2012  . GERD (gastroesophageal reflux disease) 02/04/2012  . Hypercholesterolemia 02/04/2012  . Vitamin D deficiency 02/04/2012    Immunization History  Administered Date(s) Administered  . Influenza Split 12/03/2011, 11/12/2012, 11/13/2013  . Influenza, High Dose Seasonal PF 11/23/2017  . Influenza,inj,quad, With Preservative 11/17/2016, 10/23/2018  . Influenza-Unspecified 11/14/2014, 11/04/2015, 11/28/2019  . Moderna Sars-Covid-2 Vaccination 01/21/2019, 02/18/2019, 11/06/2019  . Pneumococcal Conjugate-13 04/24/2013  . Pneumococcal Polysaccharide-23 02/02/2012  . Tdap 03/10/2010  . Zoster 07/01/2008  . Zoster Recombinat (Shingrix) 12/14/2017, 03/13/2018, 06/20/2018    Conditions to be addressed/monitored: HTN, HLD and Obesity  Care Plan : Medication Management  Updates  made by De Hollingshead, RPH-CPP since 04/27/2020 12:00 AM    Problem: Obesity, HLD, GERD     Long-Range Goal: Disease Progression Prevention   This Visit's Progress: On track  Recent Progress: On track  Priority: High  Note:   Current Barriers:  . Unable to achieve control of weight   Pharmacist Clinical Goal(s):  Marland Kitchen Over the next 90 days, patient will achieve 5-10% weight loss through collaboration with PharmD and provider.   Interventions: . 1:1 collaboration with Einar Pheasant, MD regarding development and update of comprehensive plan of care as evidenced by provider attestation and co-signature . Inter-disciplinary care team collaboration (see longitudinal plan of care) . Comprehensive medication review performed; medication list updated in electronic medical record  Obesity with Impaired Fasting Glucose: Marland Kitchen Unable to achieve weight management goals with diet and exercise along; current treatment: Ozempic 0.5 mg weekly - restarted with 0.25 mg weekly x 2 weeks then increased to 0.5 mg weekly  . Denies any vomiting, headaches, severe GI upset that she  experienced previously  o S/p lap band 2006. [facility]. Hx phentermine therapy. Reported increased anxiety. Lost weight, but regained after discontinuation.  . Baseline weight: 260 lbs; most recent weight: 255 lbs  . Current meal patterns: smaller breakfast, skips lunch usually; supper yesterday: tomato biscuit, 2 chicken salad biscuits; drinks: water and coffee . Current physical activity: doing more, but more activities around the house/yard than regular exercise.  . Continue Ozempic 0.5 mg weekly. Contact me if recurrence of severe GI side effects.  . Encouraged focus on lean meats, fresh fruits and vegetables, fiber, decreased carbohydrate intake. Discussed setting attainable exercise goals. Patient verbalized understanding   Hyperlipidemia: . Controlled per last lipid panel; current treatment:  rosuvastatin 5 mg daily  . Recommended to continue current regimen at this time  Hypothyroidism, hx Graves disease s/p radioablation: . Controlled per last TSH; current regimen: levothyroxine 137 mcg daily . Recommend to continue current regimen at this time  GERD: . Controlled per patient report; current regimen: esomeprazole 40 mg daily;  . Continue current regimen at this time, along with avoidance of trigger foods.   Supplements: . Taking Vitamin D, plans to start multivitamin (for energy) and fish oil (for dry eyes).   Patient Goals/Self-Care Activities . Over the next 90 days, patient will:  - target a minimum of 150 minutes of moderate intensity exercise weekly engage in dietary modifications by reduction in portion sizes, snacking - Contact our office with questions or concerns.   Follow Up Plan: Telephone follow up appointment with care management team member scheduled for: ~ 6 weeks      Medication Assistance: None required.  Patient affirms current coverage meets needs.  Patient's preferred pharmacy is:  [place], [place] - [place] [address] Phone: [phone] Fax: [phone]   Follow Up:  Patient agrees to Care Plan and Follow-up.  Plan: Telephone follow up appointment with care management team member scheduled for:  ~ 6 weeks  Catie Darnelle Maffucci, PharmD, [place], [place] Clinical Pharmacist [employer] at [employer] [phone]

## 2020-04-27 NOTE — Patient Instructions (Signed)
Visit Information  PATIENT GOALS: Goals Addressed              This Visit's Progress     Patient Stated   .  Medication Monitoring (pt-stated)        Patient Goals/Self-Care Activities . Over the next 90 days, patient will:  - target a minimum of 150 minutes of moderate intensity exercise weekly engage in dietary modifications by reduction in portion sizes, snacking - communicate concerns with care team       Patient verbalizes understanding of instructions provided today and agrees to view in MyChart.   Plan: Telephone follow up appointment with care management team member scheduled for:  ~ 6 weeks  Catie Feliz Beam, PharmD, [place], CPP Clinical Pharmacist [employer] at [employer] [phone]

## 2020-04-28 ENCOUNTER — Telehealth: Payer: Medicare PPO

## 2020-06-04 ENCOUNTER — Ambulatory Visit (INDEPENDENT_AMBULATORY_CARE_PROVIDER_SITE_OTHER): Payer: Medicare PPO | Admitting: Pharmacist

## 2020-06-04 VITALS — Wt 249.5 lb

## 2020-06-04 DIAGNOSIS — E785 Hyperlipidemia, unspecified: Secondary | ICD-10-CM | POA: Diagnosis not present

## 2020-06-04 DIAGNOSIS — E039 Hypothyroidism, unspecified: Secondary | ICD-10-CM | POA: Diagnosis not present

## 2020-06-04 DIAGNOSIS — Z6841 Body Mass Index (BMI) 40.0 and over, adult: Secondary | ICD-10-CM | POA: Diagnosis not present

## 2020-06-04 DIAGNOSIS — K219 Gastro-esophageal reflux disease without esophagitis: Secondary | ICD-10-CM

## 2020-06-04 DIAGNOSIS — E7800 Pure hypercholesterolemia, unspecified: Secondary | ICD-10-CM | POA: Diagnosis not present

## 2020-06-04 DIAGNOSIS — E669 Obesity, unspecified: Secondary | ICD-10-CM | POA: Diagnosis not present

## 2020-06-04 DIAGNOSIS — R7301 Impaired fasting glucose: Secondary | ICD-10-CM | POA: Diagnosis not present

## 2020-06-04 DIAGNOSIS — Z8639 Personal history of other endocrine, nutritional and metabolic disease: Secondary | ICD-10-CM

## 2020-06-04 NOTE — Chronic Care Management (AMB) (Signed)
Chronic Care Management Pharmacy Note  06/04/2020 Name:  Jodi Harris MRN:  [id number] DOB:  20-Sep-1940  Subjective: Jodi Harris is an 80 y.o. year old female who is a primary patient of Einar Pheasant, MD.  The CCM team was consulted for assistance with disease management and care coordination needs.    Engaged with patient by telephone for follow up visit in response to provider referral for pharmacy case management and/or care coordination services.   Consent to Services:  The patient was given information about Chronic Care Management services, agreed to services, and gave verbal consent prior to initiation of services.  Please see initial visit note for detailed documentation.   Patient Care Team: Einar Pheasant, MD as PCP - General (Internal Medicine) De Hollingshead, RPH-CPP (Pharmacist)  Recent office visits: None since our last visit  Recent consult visits: None since our last [facility] visits: None in previous 6 months  Objective:  Lab Results  Component Value Date   CREATININE 0.74 12/25/2019   CREATININE 0.87 03/03/2019   CREATININE 0.72 09/06/2018    No results found for: HGBA1C Last diabetic Eye exam: No results found for: HMDIABEYEEXA  Last diabetic Foot exam: No results found for: HMDIABFOOTEX      Component Value Date/Time   CHOL 131 12/25/2019 0935   TRIG 71.0 12/25/2019 0935   HDL 50.30 12/25/2019 0935   CHOLHDL 3 12/25/2019 0935   VLDL 14.2 12/25/2019 0935   LDLCALC 66 12/25/2019 0935   LDLDIRECT 129.8 02/15/2012 0912    Hepatic Function Latest Ref Rng & Units 12/25/2019 03/03/2019 09/06/2018  Total Protein 6.0 - 8.3 g/dL 6.1 6.6 5.7(L)  Albumin 3.5 - 5.2 g/dL 3.6 3.8 3.5  AST 0 - 37 U/L 15 14 13   ALT 0 - 35 U/L 9 9 7   Alk Phosphatase 39 - 117 U/L 67 68 64  Total Bilirubin 0.2 - 1.2 mg/dL 0.6 0.6 0.5  Bilirubin, Direct 0.0 - 0.3 mg/dL 0.1 0.1 0.1    Lab Results  Component Value Date/Time   TSH 0.55  12/25/2019 09:35 AM   TSH 1.03 09/06/2018 09:30 AM    CBC Latest Ref Rng & Units 03/03/2019 10/18/2017 10/23/2016  WBC 4.0 - 10.5 K/uL 4.3 4.8 4.6  Hemoglobin 12.0 - 15.0 g/dL 12.9 13.2 13.4  Hematocrit 36.0 - 46.0 % 40.2 40.7 41.7  Platelets 150.0 - 400.0 K/uL 218.0 206.0 227.0    Lab Results  Component Value Date/Time   VD25OH 60.12 02/12/2017 09:33 AM   VD25OH 48.87 09/20/2015 10:56 AM    Clinical ASCVD: No    Social History   Tobacco Use  Smoking Status Never Smoker  Smokeless Tobacco Never Used  Tobacco Comment   smoked as a teenager   BP Readings from Last 3 Encounters:  12/29/19 120/64  09/09/18 (!) 120/58  07/29/18 (!) 114/49   Pulse Readings from Last 3 Encounters:  12/29/19 67  09/09/18 63  07/29/18 81   Wt Readings from Last 3 Encounters:  06/04/20 249 lb 8 oz (113.2 kg)  03/09/20 258 lb (117 kg)  02/23/20 266 lb (120.7 kg)    Assessment: Review of patient past medical history, allergies, medications, health status, including review of consultants reports, laboratory and other test data, was performed as part of comprehensive evaluation and provision of chronic care management services.   SDOH:  (Social Determinants of Health) assessments and interventions performed:    CCM Care Plan  Allergies  Allergen Reactions  .  Codeine Sulfate   . Penicillins     Medications Reviewed Today    Reviewed by De Hollingshead, RPH-CPP (Pharmacist) on 06/04/20 at 1048  Med List Status: <None>  Medication Order Taking? Sig Documenting Provider Last Dose Status Informant  Cholecalciferol (VITAMIN D-3 PO) [id number] Yes Take 2,000 Units by mouth daily. [provider] Taking Active   esomeprazole (NEXIUM) 40 MG capsule [id number] Yes Take 1 capsule (40 mg total) by mouth daily. Einar Pheasant, MD Taking Active   fluticasone [facility]) 50 MCG/ACT nasal spray [id number] Yes Place 2 sprays into both nostrils daily as needed. Einar Pheasant, MD Taking Active    levothyroxine (SYNTHROID) 137 MCG tablet [id number] Yes Take 1 tablet (137 mcg total) by mouth daily before breakfast. Einar Pheasant, MD Taking Active   rosuvastatin (CRESTOR) 5 MG tablet [id number] Yes Take 1 tablet (5 mg total) by mouth daily. Einar Pheasant, MD Taking Active   Semaglutide,0.25 or 0.5MG/DOS, (OZEMPIC, 0.25 OR 0.5 MG/DOSE,) 2 MG/1.5ML SOPN [id number] Yes Inject 0.5 mg into the skin once a week. Einar Pheasant, MD Taking Active           Patient Active Problem List   Diagnosis Date Noted  . Cough 03/10/2020  . Heel pain 01/05/2020  . Low back pain 12/29/2019  . Right hip pain 12/29/2019  . Dizziness 11/18/2018  . Right knee pain 05/04/2018  . Weight loss counseling, encounter for 05/04/2018  . Left leg pain 06/24/2017  . Health care maintenance 04/06/2014  . Stress 10/03/2013  . Severe obesity (BMI >= 40) ([place]) 10/03/2013  . Left hip pain 09/22/2013  . Skin lesions 04/13/2013  . Hypothyroidism 02/04/2012  . History of colon polyps 02/04/2012  . GERD (gastroesophageal reflux disease) 02/04/2012  . Hypercholesterolemia 02/04/2012  . Vitamin D deficiency 02/04/2012    Immunization History  Administered Date(s) Administered  . Influenza Split 12/03/2011, 11/12/2012, 11/13/2013  . Influenza, High Dose Seasonal PF 11/23/2017  . Influenza,inj,quad, With Preservative 11/17/2016, 10/23/2018  . Influenza-Unspecified 11/14/2014, 11/04/2015, 11/28/2019  . Moderna Sars-Covid-2 Vaccination 01/21/2019, 02/18/2019, 11/06/2019  . Pneumococcal Conjugate-13 04/24/2013  . Pneumococcal Polysaccharide-23 02/02/2012  . Tdap 03/10/2010  . Zoster Recombinat (Shingrix) 12/14/2017, 03/13/2018, 06/20/2018  . Zoster, Live 07/01/2008    Conditions to be addressed/monitored: HLD and Obesity, GERD  Care Plan : Medication Management  Updates made by De Hollingshead, RPH-CPP since 06/04/2020 12:00 AM    Problem: Obesity, HLD, GERD     Long-Range Goal: Disease Progression  Prevention   This Visit's Progress: On track  Recent Progress: On track  Priority: High  Note:   Current Barriers:  . Unable to achieve control of weight   Pharmacist Clinical Goal(s):  Marland Kitchen Over the next 90 days, patient will achieve 5-10% weight loss through collaboration with PharmD and provider.   Interventions: . 1:1 collaboration with Einar Pheasant, MD regarding development and update of comprehensive plan of care as evidenced by provider attestation and co-signature . Inter-disciplinary care team collaboration (see longitudinal plan of care) . Comprehensive medication review performed; medication list updated in electronic medical record  Health Maintenance: . Overdue for follow up with PCP. Will collaborate w/ office staff to schedule . COVID vaccination x 3.   SDOH: . Over income for assistance from [employer]. Denies cost concerns with Ozempic   Obesity with Impaired Fasting Glucose: Marland Kitchen Unable to achieve weight management goals with diet and exercise along; current treatment: Ozempic 0.5 mg weekly o S/p lap band 2006. [facility]  Hx phentermine therapy. Reported increased anxiety. Lost weight, but regained after discontinuation.  . Baseline weight: 260 lbs; most recent weight: 249 lbs; 11 lbs weight loss thus far, 4.2% from baseline . Current meal patterns: continues to report smaller portion sizes.  . Current physical activity: trying to stay active around the house, in the yard.  . She notes that sometimes she worries if 0.5 mg weekly is too much for her. Discussed that she could target an "in between" dose by clicking up 9 clicks from 0.68 mg weekly. She notes she will probably continue 0.5 mg weekly, but will keep the in between dose in mind . Encouraged focus on lean meats, fresh fruits and vegetables, fiber, decreased carbohydrate intake. Discussed setting attainable exercise goals. Patient verbalized  understanding  Hyperlipidemia: . Controlled per last lipid panel; current treatment: rosuvastatin 5 mg daily  . Reports continued adherence. Fill history up to date per [facility].  . Recommended to continue current regimen at this time  Hypothyroidism, hx Graves disease s/p radioablation: . Controlled per last TSH; current regimen: levothyroxine 137 mcg daily . Confirms appropriate administration . Recommend to continue current regimen at this time  GERD: . Controlled per patient report; current regimen: esomeprazole 40 mg daily PRN - using almost once daily . Continue current regimen at this time, along with avoidance of trigger foods.   Supplements: Marland Kitchen Vitamin D  Patient Goals/Self-Care Activities . Over the next 90 days, patient will:  - target a minimum of 150 minutes of moderate intensity exercise weekly engage in dietary modifications by reduction in portion sizes, snacking - Contact our office with questions or concerns.   Follow Up Plan: Telephone follow up appointment with care management team member scheduled for: ~ 12 weeks      Medication Assistance: None required.  Patient affirms current coverage meets needs. Over income for Ozempic assistance  Patient's preferred pharmacy is:  [place], [place] [facility] [address] [place] [address] Phone: [phone] Fax: [phone]   Follow Up:  Patient agrees to Care Plan and Follow-up.  Plan: Telephone follow up appointment with care management team member scheduled for:  ~ 12 weeks  Catie Darnelle Maffucci, PharmD, [place], [place] Clinical Pharmacist [employer] at [employer] [phone]

## 2020-06-04 NOTE — Patient Instructions (Signed)
Visit Information  PATIENT GOALS: Goals Addressed              This Visit's Progress     Patient Stated   .  Medication Monitoring (pt-stated)        Patient Goals/Self-Care Activities . Over the next 90 days, patient will:  - target a minimum of 150 minutes of moderate intensity exercise weekly engage in dietary modifications by reduction in portion sizes, snacking - communicate concerns with care team         Patient verbalizes understanding of instructions provided today and agrees to view in MyChart.   Plan: Telephone follow up appointment with care management team member scheduled for:  ~ 12 weeks  Catie Feliz Beam, PharmD, [place], CPP Clinical Pharmacist [employer] at [employer] [phone]

## 2020-06-08 ENCOUNTER — Telehealth: Payer: Self-pay | Admitting: Internal Medicine

## 2020-06-08 NOTE — Telephone Encounter (Signed)
The patient called stating per Catie Feliz Beam she needed to have lab work done. I do not see any labs in the system. Patient is requesting a call back when labs have been entered.

## 2020-06-09 NOTE — Telephone Encounter (Signed)
Fasting lab scheduled prior to appt with Dr Lorin Picket. Labs were already ordered

## 2020-07-20 ENCOUNTER — Other Ambulatory Visit: Payer: Self-pay | Admitting: Internal Medicine

## 2020-07-20 DIAGNOSIS — R7301 Impaired fasting glucose: Secondary | ICD-10-CM

## 2020-07-22 ENCOUNTER — Other Ambulatory Visit: Payer: Self-pay | Admitting: Internal Medicine

## 2020-07-26 ENCOUNTER — Telehealth: Payer: Self-pay

## 2020-07-26 DIAGNOSIS — Z20822 Contact with and (suspected) exposure to covid-19: Secondary | ICD-10-CM | POA: Diagnosis not present

## 2020-07-26 NOTE — Telephone Encounter (Signed)
Sending to doc of day and PCP:  Phone call was transferred to me from front staff because when she was called to be screened for her lab appt on 7/25 and appt with PCP on 7/27, patient noted sinus symptoms. Patient is complaining of sinus pressure, sensitive teeth and nasal congestion. Patient denies fever, sob, chest tightness, etc. She was working outside cleaning out some stuff over the weekend and also stated she has recurrent sinus infections this time of year every year and Dr Lorin Picket treats with z-pak. Advised that given her symptoms she does need to be covid tested. If her covid test comes back negative she will pass screening to come into office for her appt with Dr Lorin Picket (10 days from test date). Patient has agreed to go to the drug store to have PCR test done and let me know results. Also [facility] drawn on 7/25. Advised that she will have to be seen in order to be treated for her sinus symptoms. Offered to schedule acute visit with NP or have her go to urgent care. Patient stated that she is ok to wait until her appt on 7/27 with Dr Lorin Picket. Patient is aware that Dr Lorin Picket is not in the office today but is aware that I will be sending her a message as an Financial planner. I have discussed this with Erie Noe to confirm ok to come in office for appt if covid test negative. Noted on appt notes as well. [facility] orders have been [facility]

## 2020-07-27 NOTE — Telephone Encounter (Signed)
Pt called and states that you were waiting for her call about labs.

## 2020-07-27 NOTE — Telephone Encounter (Signed)
Noted Closing as PCP aware

## 2020-07-27 NOTE — Telephone Encounter (Signed)
See me about this.  Agree with [facility].  If active symptoms, can change to virtual visit.

## 2020-07-28 NOTE — Telephone Encounter (Signed)
Patient called to let me know that her covid PCR test was negative and her sinus congestion seems to flare up depending on what she is doing around the house (if she is outside or cleaning, etc it is worse) No new symptoms noted. [facility] but she would like to be seen in the office. Let me know if this is ok.

## 2020-07-28 NOTE — Telephone Encounter (Signed)
Can confirm as closer to appt - chronic symptoms and no acute issues.

## 2020-07-28 NOTE — Telephone Encounter (Signed)
PT called to advise that she is needing to talk to [place] asap. It is in regards to her labs.

## 2020-07-29 NOTE — Telephone Encounter (Signed)
Holding for further screening next week prior to appt.

## 2020-07-29 NOTE — Addendum Note (Signed)
Addended by: Larry Sierras on: 07/29/2020 07:21 AM   Modules accepted: Orders

## 2020-08-02 ENCOUNTER — Other Ambulatory Visit (INDEPENDENT_AMBULATORY_CARE_PROVIDER_SITE_OTHER): Payer: Medicare PPO

## 2020-08-02 ENCOUNTER — Other Ambulatory Visit: Payer: Self-pay

## 2020-08-02 DIAGNOSIS — E7800 Pure hypercholesterolemia, unspecified: Secondary | ICD-10-CM

## 2020-08-02 LAB — LIPID PANEL
Cholesterol: 135 mg/dL (ref 0–200)
HDL: 47.4 mg/dL (ref >39.00)
LDL Cholesterol: 73 mg/dL (ref 0–99)
NonHDL: 87.17
Total CHOL/HDL Ratio: 3
Triglycerides: 73 mg/dL (ref 0.0–149.0)
VLDL: 14.6 mg/dL (ref 0.0–40.0)

## 2020-08-02 LAB — CBC WITH DIFFERENTIAL/PLATELET
Basophils Absolute: 0 10*3/uL (ref 0.0–0.1)
Basophils Relative: 0.5 % (ref 0.0–3.0)
Eosinophils Absolute: 0.1 10*3/uL (ref 0.0–0.7)
Eosinophils Relative: 1.2 % (ref 0.0–5.0)
HCT: 39.7 % (ref 36.0–46.0)
Hemoglobin: 12.8 g/dL (ref 12.0–15.0)
Lymphocytes Relative: 23.9 % (ref 12.0–46.0)
Lymphs Abs: 1.1 10*3/uL (ref 0.7–4.0)
MCHC: 32.2 g/dL (ref 30.0–36.0)
MCV: 89.5 fl (ref 78.0–100.0)
Monocytes Absolute: 0.4 10*3/uL (ref 0.1–1.0)
Monocytes Relative: 9.5 % (ref 3.0–12.0)
Neutro Abs: 2.9 10*3/uL (ref 1.4–7.7)
Neutrophils Relative %: 64.9 % (ref 43.0–77.0)
Platelets: 202 10*3/uL (ref 150.0–400.0)
RBC: 4.44 Mil/uL (ref 3.87–5.11)
RDW: 14.7 % (ref 11.5–15.5)
WBC: 4.4 10*3/uL (ref 4.0–10.5)

## 2020-08-02 LAB — HEPATIC FUNCTION PANEL
ALT: 9 U/L (ref 0–35)
AST: 14 U/L (ref 0–37)
Albumin: 3.6 g/dL (ref 3.5–5.2)
Alkaline Phosphatase: 69 U/L (ref 39–117)
Bilirubin, Direct: 0.1 mg/dL (ref 0.0–0.3)
Total Bilirubin: 0.6 mg/dL (ref 0.2–1.2)
Total Protein: 5.9 g/dL — ABNORMAL LOW (ref 6.0–8.3)

## 2020-08-02 LAB — BASIC METABOLIC PANEL
BUN: 20 mg/dL (ref 6–23)
CO2: 32 mEq/L (ref 19–32)
Calcium: 9.1 mg/dL (ref 8.4–10.5)
Chloride: 104 mEq/L (ref 96–112)
Creatinine, Ser: 0.76 mg/dL (ref 0.40–1.20)
GFR: 74.38 mL/min (ref >60.00)
Glucose, Bld: 88 mg/dL (ref 70–99)
Potassium: 4.3 mEq/L (ref 3.5–5.1)
Sodium: 141 mEq/L (ref 135–145)

## 2020-08-02 NOTE — Addendum Note (Signed)
Addended by: Warden Fillers on: 08/02/2020 10:01 AM   Modules accepted: Orders

## 2020-08-03 ENCOUNTER — Other Ambulatory Visit: Payer: Self-pay | Admitting: Internal Medicine

## 2020-08-03 DIAGNOSIS — Z1231 Encounter for screening mammogram for malignant neoplasm of breast: Secondary | ICD-10-CM

## 2020-08-03 NOTE — Telephone Encounter (Signed)
Called patient to screen again. Stated she is not having any active acute symptoms at this time. Sinus symptoms flare with change in weather and if she has been working outside, etc. Confirmed ok to come in the office.

## 2020-08-04 ENCOUNTER — Ambulatory Visit: Payer: Medicare PPO | Admitting: Internal Medicine

## 2020-08-04 ENCOUNTER — Other Ambulatory Visit: Payer: Self-pay

## 2020-08-04 ENCOUNTER — Encounter: Payer: Self-pay | Admitting: Internal Medicine

## 2020-08-04 DIAGNOSIS — Z86010 Personal history of colonic polyps: Secondary | ICD-10-CM

## 2020-08-04 DIAGNOSIS — E039 Hypothyroidism, unspecified: Secondary | ICD-10-CM | POA: Diagnosis not present

## 2020-08-04 DIAGNOSIS — K219 Gastro-esophageal reflux disease without esophagitis: Secondary | ICD-10-CM | POA: Diagnosis not present

## 2020-08-04 DIAGNOSIS — F439 Reaction to severe stress, unspecified: Secondary | ICD-10-CM

## 2020-08-04 DIAGNOSIS — E7800 Pure hypercholesterolemia, unspecified: Secondary | ICD-10-CM | POA: Diagnosis not present

## 2020-08-04 DIAGNOSIS — R059 Cough, unspecified: Secondary | ICD-10-CM

## 2020-08-04 MED ORDER — LEVOTHYROXINE SODIUM 137 MCG PO TABS: ORAL_TABLET | ORAL | 3 refills | Status: DC

## 2020-08-04 MED ORDER — ROSUVASTATIN CALCIUM 5 MG PO TABS: 5.0000 mg | ORAL_TABLET | Freq: Every day | ORAL | 3 refills | Status: DC

## 2020-08-04 MED ORDER — ESOMEPRAZOLE MAGNESIUM 40 MG PO CPDR: 40.0000 mg | DELAYED_RELEASE_CAPSULE | Freq: Every day | ORAL | 3 refills | Status: DC

## 2020-08-04 NOTE — Progress Notes (Signed)
Patient ID: Jodi Harris, female   DOB: 1940-04-03, 80 y.o.   MRN: [id number]   Subjective:    Patient ID: Jodi Harris, female    DOB: 1940/08/08, 80 y.o.   MRN: [id number]  HPI This visit occurred during the SARS-CoV-2 public health emergency.  Safety protocols were in place, including screening questions prior to the visit, additional usage of staff PPE, and extensive cleaning of exam room while observing appropriate contact time as indicated for disinfecting solutions.   Patient here for a scheduled follow up.  Here to follow up regarding her cholesterol and thyroid.  Also f/u regarding weight loss.  On ozempic.  Eating.  No nausea or vomiting reported.  Reports some cough - notices when lying down.  Present over the last month.  No fever.  No sore throat.  No sinus pressure.  No chest congestion, chest pain or sob reported.  Notices when lies on left side - sensation of worm crawling. Desires no further w/up or intervention.  [place] moving.  Has noticed a place on her labia (describes - feeling like a bean).  No pain.  No vaginal discharge.    Past Medical History:  Diagnosis Date   Anemia    Diverticulosis    GERD (gastroesophageal reflux disease)    History of colonic polyps    Hypercholesterolemia    Hypothyroidism    Internal hemorrhoids    Thrombophlebitis    varicosities   Past Surgical History:  Procedure Laterality Date   COLON SURGERY     laparoscopic assisted transverse colectomy for tubular adenomatous polyp   COLONOSCOPY     COLONOSCOPY WITH PROPOFOL N/A 07/29/2018   Procedure: COLONOSCOPY WITH PROPOFOL;  Surgeon: Christena Deem, MD;  Location[facility] ENDOSCOPY;  Service: Endoscopy;  Laterality: N/A;   DIAGNOSTIC LAPAROSCOPY     LAPAROSCOPIC GASTRIC BANDING     URETHRAL DIVERTICULUM REPAIR  1974   Dr Jenne Pane   Family History  Problem Relation Age of Onset   Cancer Father        lymphoma   Cancer Maternal Aunt        Breast   Breast cancer Maternal  Aunt 63   Colon cancer Other        one relative on maternal side   Social History   Socioeconomic History   Marital status: Married    Spouse name: Not on file   Number of children: 2   Years of education: Not on file   Highest education level: Not on file  Occupational History   Not on file  Tobacco Use   Smoking status: Never   Smokeless tobacco: Never   Tobacco comments:    smoked as a teenager  Vaping Use   Vaping Use: Never used  Substance and Sexual Activity   Alcohol use: Not Currently    Alcohol/week: 0.0 standard drinks    Comment: has a glass of wine about once a month   Drug use: No   Sexual activity: Never  Other Topics Concern   Not on file  Social History Narrative   Not on file   Social Determinants of Health   Financial Resource Strain: Low Risk    Difficulty of Paying Living Expenses: Not hard at all  Food Insecurity: No Food Insecurity   Worried About Programme researcher, broadcasting/film/video in the Last Year: Never true   Ran Out of Food in the Last Year: Never true  Transportation Needs: No Transportation Needs   Lack  of Transportation (Medical): No   Lack of Transportation (Non-Medical): No  Physical Activity: Insufficiently Active   Days of Exercise per Week: 1 day   Minutes of Exercise per Session: 30 min  Stress: No Stress Concern Present   Feeling of Stress : Not at all  Social Connections: Unknown   Frequency of Communication with Friends and Family: More than three times a week   Frequency of Social Gatherings with Friends and Family: More than three times a week   Attends Religious Services: Not on Scientist, clinical (histocompatibility and immunogenetics) or Organizations: Not on file   Attends Banker Meetings: Not on file   Marital Status: Married    Review of Systems  Constitutional:  Negative for appetite change and unexpected weight change.  HENT:  Negative for congestion and sinus pressure.   Respiratory:  Negative for chest tightness and shortness of breath.         Some cough as outlined. Notices when lying down at night.    Cardiovascular:  Negative for chest pain, palpitations and leg swelling.  Gastrointestinal:  Negative for abdominal pain, diarrhea, nausea and vomiting.  Genitourinary:  Negative for difficulty urinating and dysuria.  Musculoskeletal:  Negative for joint swelling and myalgias.  Neurological:  Negative for dizziness, light-headedness and headaches.  Psychiatric/Behavioral:  Negative for agitation and dysphoric mood.       Objective:    Physical Exam Vitals reviewed.  Constitutional:      General: She is not in acute distress.    Appearance: Normal appearance.  HENT:     Head: Normocephalic and atraumatic.     Right Ear: External ear normal.     Left Ear: External ear normal.  Eyes:     General: No scleral icterus.       Right eye: No discharge.        Left eye: No discharge.     Conjunctiva/sclera: Conjunctivae normal.  Neck:     Thyroid: No thyromegaly.  Cardiovascular:     Rate and Rhythm: Normal rate and regular rhythm.  Pulmonary:     Effort: No respiratory distress.     Breath sounds: Normal breath sounds. No wheezing.  Abdominal:     General: Bowel sounds are normal.     Palpations: Abdomen is soft.     Tenderness: There is no abdominal tenderness.  Musculoskeletal:        General: No swelling or tenderness.     Cervical back: Neck supple. No tenderness.  Lymphadenopathy:     Cervical: No cervical adenopathy.  Skin:    Findings: No erythema or rash.  Neurological:     Mental Status: She is alert.  Psychiatric:        Mood and Affect: Mood normal.        Behavior: Behavior normal.    BP 122/70   Pulse 79   Temp 97.8 F (36.6 C)   Resp 16   Ht 5\' 6"  (1.676 m)   Wt 249 lb (112.9 kg)   LMP 02/01/1993   SpO2 97%   BMI 40.19 kg/m  Wt Readings from Last 3 Encounters:  08/04/20 249 lb (112.9 kg)  06/04/20 249 lb 8 oz (113.2 kg)  03/09/20 258 lb (117 kg)    Outpatient Encounter  Medications as of 08/04/2020  Medication Sig   Cholecalciferol (VITAMIN D-3 PO) Take 2,000 Units by mouth daily.   fluticasone (FLONASE) 50 MCG/ACT nasal spray Place 2 sprays into both nostrils daily  as needed.   OZEMPIC, 0.25 OR 0.5 MG/DOSE, 2 MG/1.5ML SOPN Inject 0.5 mg into the skin once a week.   [DISCONTINUED] esomeprazole (NEXIUM) 40 MG capsule Take 1 capsule (40 mg total) by mouth daily.   [DISCONTINUED] levothyroxine (SYNTHROID) 137 MCG tablet Take 1 tablet (137 mcg total) by mouth daily before breakfast.   [DISCONTINUED] rosuvastatin (CRESTOR) 5 MG tablet Take 1 tablet (5 mg total) by mouth daily.   esomeprazole (NEXIUM) 40 MG capsule Take 1 capsule (40 mg total) by mouth daily.   levothyroxine (SYNTHROID) 137 MCG tablet Take 1 tablet (137 mcg total) by mouth daily before breakfast.   rosuvastatin (CRESTOR) 5 MG tablet Take 1 tablet (5 mg total) by mouth daily.   No facility-administered encounter medications on file as of 08/04/2020.     Lab Results  Component Value Date   WBC 4.4 08/02/2020   HGB 12.8 08/02/2020   HCT 39.7 08/02/2020   PLT 202.0 08/02/2020   GLUCOSE 88 08/02/2020   CHOL 135 08/02/2020   TRIG 73.0 08/02/2020   HDL 47.40 08/02/2020   LDLDIRECT 129.8 02/15/2012   LDLCALC 73 08/02/2020   ALT 9 08/02/2020   AST 14 08/02/2020   NA 141 08/02/2020   K 4.3 08/02/2020   CL 104 08/02/2020   CREATININE 0.76 08/02/2020   BUN 20 08/02/2020   CO2 32 08/02/2020   TSH 0.55 12/25/2019    MM 3D SCREEN BREAST BILATERAL  Result Date: 08/15/2019 CLINICAL DATA:  Screening. EXAM: DIGITAL SCREENING BILATERAL MAMMOGRAM WITH TOMO AND CAD COMPARISON:  Previous exam(s). ACR Breast Density Category b: There are scattered areas of fibroglandular density. FINDINGS: There are no findings suspicious for malignancy. Images were processed with CAD. IMPRESSION: No mammographic evidence of malignancy. A result letter of this screening mammogram will be mailed directly to the patient.  RECOMMENDATION: Screening mammogram in one year. (Code:SM-B-01Y) BI-RADS CATEGORY  1: Negative. Electronically Signed   By: Norva PavlovElizabeth  Brown M.D.   On: 08/15/2019 16:36       Assessment & Plan:   Problem List Items Addressed This Visit     Cough    Cough - noticed more when lying down.  Steroid nasal spray as directed.  mucinex if needed for congestion.  No sob.  Lungs clear.  Follow.         GERD (gastroesophageal reflux disease)    On nexium.  Upper symptoms controlled.        Relevant Medications   esomeprazole (NEXIUM) 40 MG capsule   History of colon polyps    Had colonoscopy 07/2018 - redundant colon and diverticulosis.  F/u recommended in 5 years.         Hypercholesterolemia    On crestor.  Low cholesterol diet and exercise.  Follow lipid panel and liver function tests.         Relevant Medications   rosuvastatin (CRESTOR) 5 MG tablet   Other Relevant Orders   Lipid panel   Hepatic function panel   Basic metabolic panel   Hypothyroidism    On thyroid replacement.  Follow tsh.        Relevant Medications   levothyroxine (SYNTHROID) 137 MCG tablet   Other Relevant Orders   TSH   Severe obesity (BMI >= 40) ([facility])    Is s/p lap band surgery.  On ozempic.  Weight is down.  Discussed diet and exercise.  Follow.         Stress    On prozac. Appears to be  doing well.  Follow.           Dale Edgerton, MD

## 2020-08-09 ENCOUNTER — Encounter: Payer: Self-pay | Admitting: Internal Medicine

## 2020-08-09 NOTE — Assessment & Plan Note (Signed)
On thyroid replacement.  Follow tsh.  

## 2020-08-09 NOTE — Assessment & Plan Note (Signed)
On nexium.  Upper symptoms controlled.  

## 2020-08-09 NOTE — Assessment & Plan Note (Signed)
Is s/p lap band surgery.  On ozempic.  Weight is down.  Discussed diet and exercise.  Follow.

## 2020-08-09 NOTE — Assessment & Plan Note (Signed)
Had colonoscopy 07/2018 - redundant colon and diverticulosis.  F/u recommended in 5 years.   

## 2020-08-09 NOTE — Assessment & Plan Note (Signed)
Cough - noticed more when lying down.  Steroid nasal spray as directed.  mucinex if needed for congestion.  No sob.  Lungs clear.  Follow.

## 2020-08-09 NOTE — Assessment & Plan Note (Signed)
On prozac.  Appears to be doing well.  Follow.  

## 2020-08-09 NOTE — Assessment & Plan Note (Signed)
On crestor.  Low cholesterol diet and exercise.  Follow lipid panel and liver function tests.   

## 2020-08-24 ENCOUNTER — Other Ambulatory Visit: Payer: Self-pay | Admitting: Internal Medicine

## 2020-08-24 DIAGNOSIS — R7301 Impaired fasting glucose: Secondary | ICD-10-CM

## 2020-08-26 ENCOUNTER — Ambulatory Visit (INDEPENDENT_AMBULATORY_CARE_PROVIDER_SITE_OTHER): Payer: Medicare PPO | Admitting: Pharmacist

## 2020-08-26 VITALS — Wt 246.0 lb

## 2020-08-26 DIAGNOSIS — E7800 Pure hypercholesterolemia, unspecified: Secondary | ICD-10-CM | POA: Diagnosis not present

## 2020-08-26 DIAGNOSIS — E039 Hypothyroidism, unspecified: Secondary | ICD-10-CM | POA: Diagnosis not present

## 2020-08-26 DIAGNOSIS — Z6839 Body mass index (BMI) 39.0-39.9, adult: Secondary | ICD-10-CM

## 2020-08-26 NOTE — Chronic Care Management (AMB) (Signed)
Chronic Care Management Pharmacy Note  08/26/2020 Name:  Jodi Harris MRN:  [id number] DOB:  Feb 12, 1940   Subjective: Jodi Harris is an 80 y.o. year old female who is a primary patient of Jodi Pheasant, MD.  The CCM team was consulted for assistance with disease management and care coordination needs.    Engaged with patient by telephone for follow up visit in response to provider referral for pharmacy case management and/or care coordination services.   Consent to Services:  The patient was given information about Chronic Care Management services, agreed to services, and gave verbal consent prior to initiation of services.  Please see initial visit note for detailed documentation.   Patient Care Team: Jodi Pheasant, MD as PCP - General (Internal Medicine) De Hollingshead, RPH-CPP (Pharmacist)  Recent office visits: 7/27 PCP - continue currnet regimen; LDL 73, eGFR 74  Objective:  Lab Results  Component Value Date   CREATININE 0.76 08/02/2020   CREATININE 0.74 12/25/2019   CREATININE 0.87 03/03/2019    No results found for: HGBA1C Last diabetic Eye exam: No results found for: HMDIABEYEEXA  Last diabetic Foot exam: No results found for: HMDIABFOOTEX      Component Value Date/Time   CHOL 135 08/02/2020 1001   TRIG 73.0 08/02/2020 1001   HDL 47.40 08/02/2020 1001   CHOLHDL 3 08/02/2020 1001   VLDL 14.6 08/02/2020 1001   LDLCALC 73 08/02/2020 1001   LDLDIRECT 129.8 02/15/2012 0912    Hepatic Function Latest Ref Rng & Units 08/02/2020 12/25/2019 03/03/2019  Total Protein 6.0 - 8.3 g/dL 5.9(L) 6.1 6.6  Albumin 3.5 - 5.2 g/dL 3.6 3.6 3.8  AST 0 - 37 U/L _0 ALT 0 - 35 U/L _1 Alk Phosphatase 39 - 117 U/L 69 67 68  Total Bilirubin 0.2 - 1.2 mg/dL 0.6 0.6 0.6  Bilirubin, Direct 0.0 - 0.3 mg/dL 0.1 0.1 0.1    Lab Results  Component Value Date/Time   TSH 0.55 12/25/2019 09:35 AM   TSH 1.03 09/06/2018 09:30 AM    CBC Latest Ref Rng &  Units 08/02/2020 03/03/2019 10/18/2017  WBC 4.0 - 10.5 K/uL 4.4 4.3 4.8  Hemoglobin 12.0 - 15.0 g/dL 12.8 12.9 13.2  Hematocrit 36.0 - 46.0 % 39.7 40.2 40.7  Platelets 150.0 - 400.0 K/uL 202.0 218.0 206.0    Lab Results  Component Value Date/Time   VD25OH 60.12 02/12/2017 09:33 AM   VD25OH 48.87 09/20/2015 10:56 AM    Clinical ASCVD: No  The 10-year ASCVD risk score Mikey Bussing DC Jr., et al., 2013) is: 22.2%   Values used to calculate the score:     Age: 89 years     Sex: Female     Is Non-Hispanic African American: No     Diabetic: No     Tobacco smoker: No     Systolic Blood Pressure: 502 mmHg     Is BP treated: No     HDL Cholesterol: 47.4 mg/dL     Total Cholesterol: 135 mg/dL     Social History   Tobacco Use  Smoking Status Never  Smokeless Tobacco Never  Tobacco Comments   smoked as a teenager   BP Readings from Last 3 Encounters:  08/04/20 122/70  12/29/19 120/64  09/09/18 (!) 120/58   Pulse Readings from Last 3 Encounters:  08/04/20 79  12/29/19 67  09/09/18 63   Wt Readings from Last 3 Encounters:  08/26/20 246 lb (111.6 kg)  08/04/20 249 lb (112.9 kg)  06/04/20 249 lb 8 oz (113.2 kg)    Assessment: Review of patient past medical history, allergies, medications, health status, including review of consultants reports, laboratory and other test data, was performed as part of comprehensive evaluation and provision of chronic care management services.   SDOH:  (Social Determinants of Health) assessments and interventions performed:  SDOH Interventions    Flowsheet Row Most Recent Value  SDOH Interventions   Financial Strain Interventions Intervention Not Indicated       CCM Care Plan  Allergies  Allergen Reactions   Codeine Sulfate    Penicillins     Medications Reviewed Today     Reviewed by De Hollingshead, RPH-CPP (Pharmacist) on 08/26/20 at 1104  Med List Status: <None>   Medication Order Taking? Sig Documenting Provider Last Dose  Status Informant  Cholecalciferol (VITAMIN D-3 PO) [id number] Yes Take 2,000 Units by mouth daily. [provider] Taking Active   esomeprazole (NEXIUM) 40 MG capsule [id number] Yes Take 1 capsule (40 mg total) by mouth daily. Jodi Pheasant, MD Taking Active   fluticasone [facility]) 50 MCG/ACT nasal spray [id number] Yes Place 2 sprays into both nostrils daily as needed. Jodi Pheasant, MD Taking Active   levothyroxine (SYNTHROID) 137 MCG tablet [id number] Yes Take 1 tablet (137 mcg total) by mouth daily before breakfast. Jodi Pheasant, MD Taking Active   OZEMPIC, 0.25 OR 0.5 MG/DOSE, 2 MG/1.5ML SOPN [id number] Yes Inject 0.5 mg into the skin once a week. Jodi Pheasant, MD Taking Active   rosuvastatin (CRESTOR) 5 MG tablet [id number] Yes Take 1 tablet (5 mg total) by mouth daily. Jodi Pheasant, MD Taking Active             Patient Active Problem List   Diagnosis Date Noted   Cough 03/10/2020   Heel pain 01/05/2020   Low back pain 12/29/2019   Right hip pain 12/29/2019   Dizziness 11/18/2018   Right knee pain 05/04/2018   Weight loss counseling, encounter for 05/04/2018   Left leg pain 06/24/2017   Health care maintenance 04/06/2014   Stress 10/03/2013   Severe obesity (BMI >= 40) (HCC) 10/03/2013   Left hip pain 09/22/2013   Skin lesions 04/13/2013   Hypothyroidism 02/04/2012   History of colon polyps 02/04/2012   GERD (gastroesophageal reflux disease) 02/04/2012   Hypercholesterolemia 02/04/2012   Vitamin D deficiency 02/04/2012    Immunization History  Administered Date(s) Administered   Influenza Split 12/03/2011, 11/12/2012, 11/13/2013   Influenza, High Dose Seasonal PF 11/23/2017   Influenza,inj,quad, With Preservative 11/17/2016, 10/23/2018   Influenza-Unspecified 11/14/2014, 11/04/2015, 11/28/2019   Moderna Sars-Covid-2 Vaccination 01/21/2019, 02/18/2019, 11/06/2019   Pneumococcal Conjugate-13 04/24/2013   Pneumococcal Polysaccharide-23 02/02/2012   Tdap  03/10/2010   Zoster Recombinat (Shingrix) 12/14/2017, 03/13/2018, 06/20/2018   Zoster, Live 07/01/2008    Conditions to be addressed/monitored: HLD and Obesity  Care Plan : Medication Management  Updates made by De Hollingshead, RPH-CPP since 08/26/2020 12:00 AM     Problem: Obesity, HLD, GERD      Long-Range Goal: Disease Progression Prevention   This Visit's Progress: On track  Recent Progress: On track  Priority: High  Note:   Current Barriers:  Unable to achieve control of weight   Pharmacist Clinical Goal(s):  Over the next 90 days, patient will achieve 5-10% weight loss through collaboration with PharmD and provider.   Interventions: 1:1 collaboration with Jodi Pheasant, MD regarding development and update of comprehensive plan of care as evidenced  by provider attestation and co-signature Inter-disciplinary care team collaboration (see longitudinal plan of care) Comprehensive medication review performed; medication list updated in electronic medical record  Health Maintenance: COVID vaccination x 3. Discussed Td/Tdap. Encouraged to receive at local pharmacy. Encouraged to receive yearly influenza vaccine this season.  Mammogram ordered. Previously scheduled, but she had to cancel. Plans to call to reschedule.   Obesity with Impaired Fasting Glucose: Unable to achieve weight management goals with diet and exercise along; current treatment: Ozempic 6.30 mg + 10 clicks weekly S/p lap band 2006. [facility]. Hx phentermine therapy. Reported increased anxiety. Lost weight, but regained after discontinuation.  Baseline weight: 260 lbs; most recent weight: 246 lbs; 14 lbs weight loss thus far, 5.4% from baseline Current meal patterns: continues to report smaller portion sizes, but still feels up to eating on this dose.  Current physical activity: trying to stay active around the house, in the yard.  She prefers to continue current regimen at  this time rather than increasing dose to 0.5 mg. Appropriate given success of weight loss on this dose. Continue current regimen at this time. Advised to let me know if cost concerns or access concerns.   Hyperlipidemia: Controlled per last lipid panel; current treatment: rosuvastatin 5 mg daily  Recommended to continue current regimen at this time  Hypothyroidism, hx Graves disease s/p radioablation: Controlled per last TSH; current regimen: levothyroxine 137 mcg daily Confirms appropriate administration.  Recommend to continue current regimen at this time  GERD: Controlled per patient report; current regimen: esomeprazole 40 mg daily Does not feel like Ozempic has worsened acid reflux.  Continue current regimen at this time, along with avoidance of trigger foods. Discussed benefit of weight loss on acid reflux symptoms.  Supplements: Vitamin D  Patient Goals/Self-Care Activities Over the next 90 days, patient will:  - target a minimum of 150 minutes of moderate intensity exercise weekly engage in dietary modifications by reduction in portion sizes, snacking - Contact our office with questions or concerns.   Follow Up Plan: Telephone follow up appointment with care management team member scheduled for: ~ 4 months      Medication Assistance: None required.  Patient affirms current coverage meets needs.  Patient's preferred pharmacy is:  [facility] [place] - [address] [place] [address] Phone: [phone] Fax: [phone]    Follow Up:  Patient agrees to Care Plan and Follow-up.  Plan: Telephone follow up appointment with care management team member scheduled for:  ~ 4 months  Catie Darnelle Maffucci, PharmD, [place], [place] Clinical Pharmacist [employer] at [employer] [phone]

## 2020-08-26 NOTE — Patient Instructions (Signed)
Visit Information  PATIENT GOALS:  Goals Addressed               This Visit's Progress     Patient Stated     Medication Monitoring (pt-stated)        Patient Goals/Self-Care Activities Over the next 90 days, patient will:  - target a minimum of 150 minutes of moderate intensity exercise weekly engage in dietary modifications by reduction in portion sizes, snacking - communicate concerns with care team        Patient verbalizes understanding of instructions provided today and agrees to view in MyChart.   Plan: Telephone follow up appointment with care management team member scheduled for:  ~ 4 months  Catie Feliz Beam, PharmD, [place], CPP Clinical Pharmacist [employer] at [employer] [phone]

## 2020-10-03 DIAGNOSIS — M722 Plantar fascial fibromatosis: Secondary | ICD-10-CM | POA: Diagnosis not present

## 2020-10-04 ENCOUNTER — Other Ambulatory Visit: Payer: Self-pay | Admitting: Internal Medicine

## 2020-10-04 DIAGNOSIS — R7301 Impaired fasting glucose: Secondary | ICD-10-CM

## 2020-11-08 ENCOUNTER — Other Ambulatory Visit: Payer: Self-pay | Admitting: Internal Medicine

## 2020-11-08 DIAGNOSIS — R7301 Impaired fasting glucose: Secondary | ICD-10-CM

## 2020-11-11 ENCOUNTER — Other Ambulatory Visit: Payer: Self-pay

## 2020-11-11 ENCOUNTER — Ambulatory Visit
Admission: RE | Admit: 2020-11-11 | Discharge: 2020-11-11 | Disposition: A | Discharge status: Home / Self Care | Payer: Medicare PPO | Source: Ambulatory Visit | Attending: Internal Medicine | Admitting: Internal Medicine

## 2020-11-11 DIAGNOSIS — Z1231 Encounter for screening mammogram for malignant neoplasm of breast: Secondary | ICD-10-CM | POA: Insufficient documentation

## 2020-12-07 ENCOUNTER — Other Ambulatory Visit: Payer: Self-pay

## 2020-12-07 ENCOUNTER — Other Ambulatory Visit (INDEPENDENT_AMBULATORY_CARE_PROVIDER_SITE_OTHER): Payer: Medicare PPO

## 2020-12-07 DIAGNOSIS — E039 Hypothyroidism, unspecified: Secondary | ICD-10-CM

## 2020-12-07 DIAGNOSIS — E7800 Pure hypercholesterolemia, unspecified: Secondary | ICD-10-CM | POA: Diagnosis not present

## 2020-12-07 LAB — LIPID PANEL
Cholesterol: 132 mg/dL (ref 0–200)
HDL: 49.3 mg/dL (ref >39.00)
LDL Cholesterol: 69 mg/dL (ref 0–99)
NonHDL: 82.92
Total CHOL/HDL Ratio: 3
Triglycerides: 69 mg/dL (ref 0.0–149.0)
VLDL: 13.8 mg/dL (ref 0.0–40.0)

## 2020-12-07 LAB — TSH: TSH: 0.12 u[IU]/mL — ABNORMAL LOW (ref 0.35–5.50)

## 2020-12-07 LAB — BASIC METABOLIC PANEL
BUN: 16 mg/dL (ref 6–23)
CO2: 33 mEq/L — ABNORMAL HIGH (ref 19–32)
Calcium: 9.5 mg/dL (ref 8.4–10.5)
Chloride: 101 mEq/L (ref 96–112)
Creatinine, Ser: 0.76 mg/dL (ref 0.40–1.20)
GFR: 74.19 mL/min (ref >60.00)
Glucose, Bld: 84 mg/dL (ref 70–99)
Potassium: 4.4 mEq/L (ref 3.5–5.1)
Sodium: 141 mEq/L (ref 135–145)

## 2020-12-07 LAB — HEPATIC FUNCTION PANEL
ALT: 8 U/L (ref 0–35)
AST: 14 U/L (ref 0–37)
Albumin: 3.7 g/dL (ref 3.5–5.2)
Alkaline Phosphatase: 72 U/L (ref 39–117)
Bilirubin, Direct: 0.1 mg/dL (ref 0.0–0.3)
Total Bilirubin: 0.6 mg/dL (ref 0.2–1.2)
Total Protein: 6.5 g/dL (ref 6.0–8.3)

## 2020-12-09 ENCOUNTER — Other Ambulatory Visit: Payer: Self-pay | Admitting: Internal Medicine

## 2020-12-09 ENCOUNTER — Encounter: Payer: Self-pay | Admitting: Internal Medicine

## 2020-12-09 ENCOUNTER — Ambulatory Visit: Payer: Medicare PPO | Admitting: Internal Medicine

## 2020-12-09 ENCOUNTER — Other Ambulatory Visit: Payer: Self-pay

## 2020-12-09 VITALS — BP 132/60 | HR 60 | Temp 97.6°F | Resp 16 | Ht 66.0 in | Wt 240.5 lb

## 2020-12-09 DIAGNOSIS — Z860100 Personal history of colon polyps, unspecified: Secondary | ICD-10-CM

## 2020-12-09 DIAGNOSIS — F439 Reaction to severe stress, unspecified: Secondary | ICD-10-CM | POA: Diagnosis not present

## 2020-12-09 DIAGNOSIS — K219 Gastro-esophageal reflux disease without esophagitis: Secondary | ICD-10-CM

## 2020-12-09 DIAGNOSIS — M791 Myalgia, unspecified site: Secondary | ICD-10-CM | POA: Diagnosis not present

## 2020-12-09 DIAGNOSIS — E039 Hypothyroidism, unspecified: Secondary | ICD-10-CM

## 2020-12-09 DIAGNOSIS — M25552 Pain in left hip: Secondary | ICD-10-CM | POA: Diagnosis not present

## 2020-12-09 DIAGNOSIS — E7800 Pure hypercholesterolemia, unspecified: Secondary | ICD-10-CM

## 2020-12-09 DIAGNOSIS — R5383 Other fatigue: Secondary | ICD-10-CM | POA: Diagnosis not present

## 2020-12-09 DIAGNOSIS — R7301 Impaired fasting glucose: Secondary | ICD-10-CM

## 2020-12-09 LAB — CK: Total CK: 40 U/L (ref 7–177)

## 2020-12-09 LAB — CBC WITH DIFFERENTIAL/PLATELET
Basophils Absolute: 0 10*3/uL (ref 0.0–0.1)
Basophils Relative: 0.7 % (ref 0.0–3.0)
Eosinophils Absolute: 0 10*3/uL (ref 0.0–0.7)
Eosinophils Relative: 0.8 % (ref 0.0–5.0)
HCT: 41.4 % (ref 36.0–46.0)
Hemoglobin: 13.1 g/dL (ref 12.0–15.0)
Lymphocytes Relative: 19.4 % (ref 12.0–46.0)
Lymphs Abs: 1.2 10*3/uL (ref 0.7–4.0)
MCHC: 31.6 g/dL (ref 30.0–36.0)
MCV: 89.3 fl (ref 78.0–100.0)
Monocytes Absolute: 0.6 10*3/uL (ref 0.1–1.0)
Monocytes Relative: 9.4 % (ref 3.0–12.0)
Neutro Abs: 4.2 10*3/uL (ref 1.4–7.7)
Neutrophils Relative %: 69.7 % (ref 43.0–77.0)
Platelets: 242 10*3/uL (ref 150.0–400.0)
RBC: 4.64 Mil/uL (ref 3.87–5.11)
RDW: 14.2 % (ref 11.5–15.5)
WBC: 6 10*3/uL (ref 4.0–10.5)

## 2020-12-09 LAB — C-REACTIVE PROTEIN: CRP: 1.3 mg/dL (ref 0.5–20.0)

## 2020-12-09 LAB — SEDIMENTATION RATE: Sed Rate: 18 mm/hr (ref 0–30)

## 2020-12-09 MED ORDER — LEVOTHYROXINE SODIUM 125 MCG PO TABS: 125.0000 ug | ORAL_TABLET | Freq: Every day | ORAL | 1 refills | Status: DC

## 2020-12-09 NOTE — Progress Notes (Signed)
Patient ID: Jodi Harris, female   DOB: [birth year], 80 y.o.   MRN: [id number]   Subjective:    Patient ID: Jodi Harris, female    DOB: [birth year], 80 y.o.   MRN: [id number]  This visit occurred during the SARS-CoV-2 public health emergency.  Safety protocols were in place, including screening questions prior to the visit, additional usage of staff PPE, and extensive cleaning of exam room while observing appropriate contact time as indicated for disinfecting solutions.   Patient here for a scheduled follow up.    Chief Complaint  Patient presents with   Follow-up   Leg Pain    Bilateral leg pain   .   HPI Here to follow up regarding her cholesterol and hypothyroidism.  Increased pain - bilateral hips/legs.  Grabbing sensation.  Describes soreness and aching.  Has to use arms to get up.  Legs are sore with palpation.  More stiffness - bending.  Left > right - hip soreness.  Previous xray lumbar spine - multilevel degenerative disc disease.  Hip xray - mild degenerative changes of hip.  Discussed stretches.  No chest pain.  Breathing stable.  No vomiting.     Past Medical History:  Diagnosis Date   Anemia    Diverticulosis    GERD (gastroesophageal reflux disease)    History of colonic polyps    Hypercholesterolemia    Hypothyroidism    Internal hemorrhoids    Thrombophlebitis    varicosities   Past Surgical History:  Procedure Laterality Date   COLON SURGERY     laparoscopic assisted transverse colectomy for tubular adenomatous polyp   COLONOSCOPY     COLONOSCOPY WITH PROPOFOL N/A 07/29/2018   Procedure: COLONOSCOPY WITH PROPOFOL;  Surgeon: Lollie Sails, MD;  Location: [facility] ENDOSCOPY;  Service: Endoscopy;  Laterality: N/A;   DIAGNOSTIC LAPAROSCOPY     LAPAROSCOPIC GASTRIC BANDING     URETHRAL DIVERTICULUM REPAIR  1974   Dr Redmond Baseman   Family History  Problem Relation Age of Onset   Cancer Father        lymphoma   Cancer Maternal Aunt        Breast    Breast cancer Maternal Aunt 7   Colon cancer Other        one relative on maternal side   Social History   Socioeconomic History   Marital status: Married    Spouse name: Not on file   Number of children: 2   Years of education: Not on file   Highest education level: Not on file  Occupational History   Not on file  Tobacco Use   Smoking status: Never   Smokeless tobacco: Never   Tobacco comments:    smoked as a teenager  Vaping Use   Vaping Use: Never used  Substance and Sexual Activity   Alcohol use: Not Currently    Alcohol/week: 0.0 standard drinks    Comment: has a glass of wine about once a month   Drug use: No   Sexual activity: Never  Other Topics Concern   Not on file  Social History Narrative   Not on file   Social Determinants of Health   Financial Resource Strain: Low Risk    Difficulty of Paying Living Expenses: Not hard at all  Food Insecurity: No Food Insecurity   Worried About Charity fundraiser in the Last Year: Never true   [place] in the Last Year: Never true  Transportation Needs: No  Transportation Needs   Lack of Transportation (Medical): No   Lack of Transportation (Non-Medical): No  Physical Activity: Insufficiently Active   Days of Exercise per Week: 1 day   Minutes of Exercise per Session: 30 min  Stress: No Stress Concern Present   Feeling of Stress : Not at all  Social Connections: Unknown   Frequency of Communication with Friends and Family: More than three times a week   Frequency of Social Gatherings with Friends and Family: More than three times a week   Attends Religious Services: Not on Electrical engineer or Organizations: Not on file   Attends Archivist Meetings: Not on file   Marital Status: Married     Review of Systems  Constitutional:  Negative for appetite change and unexpected weight change.  HENT:  Negative for congestion and sinus pressure.   Respiratory:  Negative for cough, chest  tightness and shortness of breath.   Cardiovascular:  Negative for chest pain, palpitations and leg swelling.  Gastrointestinal:  Negative for abdominal pain, diarrhea, nausea and vomiting.  Genitourinary:  Negative for difficulty urinating and dysuria.  Musculoskeletal:  Negative for joint swelling.       Back/hip and leg pain as outlined.   Skin:  Negative for color change and rash.  Neurological:  Negative for dizziness, light-headedness and headaches.  Psychiatric/Behavioral:  Negative for agitation and dysphoric mood.       Objective:     BP 132/60   Pulse 60   Temp 97.6 F (36.4 C) (Oral)   Resp 16   Ht $R'5\' 6"'mD$  (1.676 m)   Wt 240 lb 8 oz (109.1 kg)   LMP 02/01/1993   SpO2 94%   BMI 38.82 kg/m  Wt Readings from Last 3 Encounters:  12/09/20 240 lb 8 oz (109.1 kg)  08/26/20 246 lb (111.6 kg)  08/04/20 249 lb (112.9 kg)    Physical Exam Vitals reviewed.  Constitutional:      General: She is not in acute distress.    Appearance: Normal appearance.  HENT:     Head: Normocephalic and atraumatic.     Right Ear: External ear normal.     Left Ear: External ear normal.  Eyes:     General: No scleral icterus.       Right eye: No discharge.        Left eye: No discharge.     Conjunctiva/sclera: Conjunctivae normal.  Neck:     Thyroid: No thyromegaly.  Cardiovascular:     Rate and Rhythm: Normal rate and regular rhythm.  Pulmonary:     Effort: No respiratory distress.     Breath sounds: Normal breath sounds. No wheezing.  Abdominal:     General: Bowel sounds are normal.     Palpations: Abdomen is soft.     Tenderness: There is no abdominal tenderness.  Musculoskeletal:        General: No swelling or tenderness.     Cervical back: Neck supple. No tenderness.  Lymphadenopathy:     Cervical: No cervical adenopathy.  Skin:    Findings: No erythema or rash.  Neurological:     Mental Status: She is alert.  Psychiatric:        Mood and Affect: Mood normal.         Behavior: Behavior normal.     Outpatient Encounter Medications as of 12/09/2020  Medication Sig   Cholecalciferol (VITAMIN D-3 PO) Take 2,000 Units by mouth daily.  esomeprazole (NEXIUM) 40 MG capsule Take 1 capsule (40 mg total) by mouth daily.   fluticasone (FLONASE) 50 MCG/ACT nasal spray Place 2 sprays into both nostrils daily as needed.   levothyroxine (SYNTHROID) 125 MCG tablet Take 1 tablet (125 mcg total) by mouth daily before breakfast.   rosuvastatin (CRESTOR) 5 MG tablet Take 1 tablet (5 mg total) by mouth daily.   [DISCONTINUED] levothyroxine (SYNTHROID) 137 MCG tablet Take 1 tablet (137 mcg total) by mouth daily before breakfast.   [DISCONTINUED] OZEMPIC, 0.25 OR 0.5 MG/DOSE, 2 MG/1.5ML SOPN Inject 0.5 mg into the skin once a week.   No facility-administered encounter medications on file as of 12/09/2020.     Lab Results  Component Value Date   WBC 6.0 12/09/2020   HGB 13.1 12/09/2020   HCT 41.4 12/09/2020   PLT 242.0 12/09/2020   GLUCOSE 84 12/07/2020   CHOL 132 12/07/2020   TRIG 69.0 12/07/2020   HDL 49.30 12/07/2020   LDLDIRECT 129.8 02/15/2012   LDLCALC 69 12/07/2020   ALT 8 12/07/2020   AST 14 12/07/2020   NA 141 12/07/2020   K 4.4 12/07/2020   CL 101 12/07/2020   CREATININE 0.76 12/07/2020   BUN 16 12/07/2020   CO2 33 (H) 12/07/2020   TSH 0.12 (L) 12/07/2020    MM 3D SCREEN BREAST BILATERAL  Result Date: 11/11/2020 CLINICAL DATA:  Screening. EXAM: DIGITAL SCREENING BILATERAL MAMMOGRAM WITH TOMOSYNTHESIS AND CAD TECHNIQUE: Bilateral screening digital craniocaudal and mediolateral oblique mammograms were obtained. Bilateral screening digital breast tomosynthesis was performed. The images were evaluated with computer-aided detection. COMPARISON:  Previous exam(s). ACR Breast Density Category b: There are scattered areas of fibroglandular density. FINDINGS: There are no findings suspicious for malignancy. IMPRESSION: No mammographic evidence of malignancy. A  result letter of this screening mammogram will be mailed directly to the patient. RECOMMENDATION: Screening mammogram in one year. (Code:SM-B-01Y) BI-RADS CATEGORY  1: Negative. Electronically Signed   By: Dorise Bullion III M.D.   On: 11/11/2020 16:47      Assessment & Plan:   Problem List Items Addressed This Visit     GERD (gastroesophageal reflux disease)    On nexium.  Upper symptoms controlled.       History of colon polyps    Had colonoscopy 07/2018 - redundant colon and diverticulosis.  F/u recommended in 5 years.        Hypercholesterolemia - Primary    On crestor.  Low cholesterol diet and exercise.  Follow lipid panel and liver function tests.        Hypothyroidism    On thyroid replacement.  Follow tsh.       Relevant Medications   levothyroxine (SYNTHROID) 125 MCG tablet   Left hip pain    Left hip pain >right.  Xray as outlined.  Check labs.  Stretches.  Consider referral to ortho or rheum if persistent.       Muscle ache    Increased pain and muscle aches as outlined.  Check esr, ck and routing labs.  Recent xray as outlined.        Relevant Orders   CK (Creatine Kinase) (Completed)   Sedimentation rate (Completed)   CBC with Differential/Platelet (Completed)   C-reactive protein (Completed)   Stress    On prozac. Appears to be doing well.  Follow.        Other Visit Diagnoses     Fatigue, unspecified type            Nyeisha Goodall  Nicki Reaper, MD

## 2020-12-12 ENCOUNTER — Encounter: Payer: Self-pay | Admitting: Internal Medicine

## 2020-12-12 NOTE — Assessment & Plan Note (Signed)
Had colonoscopy 07/2018 - redundant colon and diverticulosis.  F/u recommended in 5 years.   

## 2020-12-12 NOTE — Assessment & Plan Note (Signed)
On nexium.  Upper symptoms controlled.  

## 2020-12-12 NOTE — Assessment & Plan Note (Signed)
On prozac.  Appears to be doing well.  Follow.  

## 2020-12-12 NOTE — Assessment & Plan Note (Signed)
On crestor.  Low cholesterol diet and exercise.  Follow lipid panel and liver function tests.   

## 2020-12-12 NOTE — Assessment & Plan Note (Signed)
Left hip pain >right.  Xray as outlined.  Check labs.  Stretches.  Consider referral to ortho or rheum if persistent.

## 2020-12-12 NOTE — Assessment & Plan Note (Signed)
Increased pain and muscle aches as outlined.  Check esr, ck and routing labs.  Recent xray as outlined.

## 2020-12-12 NOTE — Assessment & Plan Note (Signed)
On thyroid replacement.  Follow tsh.  

## 2020-12-20 ENCOUNTER — Ambulatory Visit: Payer: Medicare PPO | Admitting: Pharmacist

## 2020-12-20 VITALS — Wt 233.0 lb

## 2020-12-20 DIAGNOSIS — Z6837 Body mass index (BMI) 37.0-37.9, adult: Secondary | ICD-10-CM

## 2020-12-20 DIAGNOSIS — E039 Hypothyroidism, unspecified: Secondary | ICD-10-CM

## 2020-12-20 DIAGNOSIS — E7800 Pure hypercholesterolemia, unspecified: Secondary | ICD-10-CM

## 2020-12-20 MED ORDER — OZEMPIC (0.25 OR 0.5 MG/DOSE) 2 MG/1.5ML ~~LOC~~ SOPN: 0.5000 mg | PEN_INJECTOR | SUBCUTANEOUS | 1 refills | Status: DC

## 2020-12-20 NOTE — Chronic Care Management (AMB) (Signed)
Chronic Care Management CCM Pharmacy Note  12/20/2020 Name:  Jodi Harris MRN:  [id number] DOB:  02-26-1940  Summary: - Continues to report muscle weakness, joint pain. Declines referral to PT or rheumatology, has an appointment with orthopedics later this month that she wold like to attend first.   Recommendations/Changes made from today's visit: - Continue current regimen at this time  Subjective: Jodi Harris is an 80 y.o. year old female who is a primary patient of Dale [place], MD.  The CCM team was consulted for assistance with disease management and care coordination needs.    Engaged with patient by telephone for follow up visit for pharmacy case management and/or care coordination services.   Objective:  Medications Reviewed Today     Reviewed by Lourena Simmonds, RPH-CPP (Pharmacist) on 12/20/20 at 1138  Med List Status: <None>   Medication Order Taking? Sig Documenting Provider Last Dose Status Informant  Cholecalciferol (VITAMIN D-3 PO) [id number] Yes Take 2,000 Units by mouth daily. [provider] Taking Active   esomeprazole (NEXIUM) 40 MG capsule [id number] Yes Take 1 capsule (40 mg total) by mouth daily. Dale [place], MD Taking Active   fluticasone [facility]) 50 MCG/ACT nasal spray [id number] Yes Place 2 sprays into both nostrils daily as needed. Dale [place], MD Taking Active   levothyroxine (SYNTHROID) 125 MCG tablet [id number] Yes Take 1 tablet (125 mcg total) by mouth daily before breakfast. Dale [place], MD Taking Active   OZEMPIC, 0.25 OR 0.5 MG/DOSE, 2 MG/1.5ML SOPN [id number] Yes Inject 0.5 mg into the skin once a week. Dale [place], MD Taking Active   rosuvastatin (CRESTOR) 5 MG tablet [id number] No Take 1 tablet (5 mg total) by mouth daily.  Patient not taking: Reported on 12/20/2020   Dale [place], MD Not Taking Active             Pertinent Labs:   No results found for: HGBA1C Lab Results  Component Value Date    CHOL 132 12/07/2020   HDL 49.30 12/07/2020   LDLCALC 69 12/07/2020   LDLDIRECT 129.8 02/15/2012   TRIG 69.0 12/07/2020   CHOLHDL 3 12/07/2020   Lab Results  Component Value Date   CREATININE 0.76 12/07/2020   BUN 16 12/07/2020   NA 141 12/07/2020   K 4.4 12/07/2020   CL 101 12/07/2020   CO2 33 (H) 12/07/2020    SDOH:  (Social Determinants of Health) assessments and interventions performed:    CCM Care Plan  Review of patient past medical history, allergies, medications, health status, including review of consultants reports, laboratory and other test data, was performed as part of comprehensive evaluation and provision of chronic care management services.   Care Plan : Medication Management  Updates made by Lourena Simmonds, RPH-CPP since 12/20/2020 12:00 AM     Problem: Obesity, HLD, GERD      Long-Range Goal: Disease Progression Prevention   Recent Progress: On track  Priority: High  Note:   Current Barriers:  Unable to achieve control of weight   Pharmacist Clinical Goal(s):  Over the next 90 days, patient will achieve 5-10% weight loss through collaboration with PharmD and provider.   Interventions: 1:1 collaboration with Dale [place]ens, MD regarding development and update of comprehensive plan of care as evidenced by provider attestation and co-signature Inter-disciplinary care team collaboration (see longitudinal plan of care) Comprehensive medication review performed; medication list updated in electronic medical record  Health Maintenance   Yearly influenza vaccination: up to date Td/Tdap vaccination:  up to date Pneumonia vaccination: up to date COVID vaccinations: up to date - added to chart Shingrix vaccinations: up to date Colonoscopy: up to date Bone density scan: up to date Mammogram: up to date    Obesity with Impaired Fasting Glucose: Unable to achieve weight management goals with diet and exercise along; current treatment: Ozempic 0.25  mg + 12 clicks weekly S/p lap band 2006. [facility]. Hx phentermine therapy. Reported increased anxiety. Lost weight, but regained after discontinuation.  Baseline weight: 260 lbs; most recent weight: 233 lbs; 27 lbs weight loss thus far, 5.4% from baseline She does not feel that this medication has related to any of her reported muscle/joint issues. Requests to continue. Contiue Ozempic 0.25 mg + 12 clicks weekly. Script refilled to [facility].   Hyperlipidemia: Controlled per last lipid panel; current treatment: rosuvastatin 5 mg daily  -holding  Reported worsening muscle weakness and joint pain at last PCP appointment. Held statin to evaluate if related. Patient does not endorse any improvement in joint pains. Reports some improvement in weakness, but attributes this more to uncontrolled TSH on last check.  Discussed that if no improvement after holding rosuvastatin, appropriate to restart. She declines at this time, prefers to see orthopedics first. Discussed referral to PT or rheumatology as noted in PCP lab results. She declines at this time, prefers to see orthopedics first. Will let us know if she changes her mind.  Recommended to continue current regimen at this time  Hypothyroidism, hx Graves disease s/p radioablation: Controlled per last TSH; current regimen: levothyroxine 125 mcg daily Recommend to continue current regimen at this time  GERD: Controlled per patient report; current regimen: esomeprazole 40 mg daily  Continue current regimen at this time, along with avoidance of trigger foods.   Supplements: Vitamin D  Patient Goals/Self-Care Activities Over the next 90 days, patient will:  - target a minimum of 150 minutes of moderate intensity exercise weekly engage in dietary modifications by reduction in portion sizes, snacking - Contact our office with questions or concerns.       Plan: Telephone follow up appointment with care management team  member scheduled for:  3 months  Catie Feliz Beam, PharmD, [place], CPP Clinical Pharmacist [employer] at [employer] [phone]

## 2020-12-20 NOTE — Patient Instructions (Signed)
Visit Information  Following are the goals we discussed today:  Patient Goals/Self-Care Activities Over the next 90 days, patient will:  - target a minimum of 150 minutes of moderate intensity exercise weekly engage in dietary modifications by reduction in portion sizes, snacking - Contact our office with questions or concerns.         Plan: Telephone follow up appointment with care management team member scheduled for:  3 months   Catie Feliz Beam, PharmD, [place], CPP Clinical Pharmacist [facility] at [facility] [phone]   Please call the care guide team at [phone] if you need to cancel or reschedule your appointment.   Patient verbalizes understanding of instructions provided today and agrees to view in MyChart.

## 2020-12-22 DIAGNOSIS — Z0100 Encounter for examination of eyes and vision without abnormal findings: Secondary | ICD-10-CM | POA: Diagnosis not present

## 2020-12-22 DIAGNOSIS — H43813 Vitreous degeneration, bilateral: Secondary | ICD-10-CM | POA: Diagnosis not present

## 2020-12-28 ENCOUNTER — Telehealth: Payer: Self-pay | Admitting: Internal Medicine

## 2020-12-28 DIAGNOSIS — M2550 Pain in unspecified joint: Secondary | ICD-10-CM

## 2020-12-28 DIAGNOSIS — M791 Myalgia, unspecified site: Secondary | ICD-10-CM

## 2020-12-28 NOTE — Telephone Encounter (Signed)
Pt was seen recently on 12/1 by PCP. Pt called in stating she is not feeling any better than she did before. She still feels overall the same.

## 2020-12-29 NOTE — Telephone Encounter (Signed)
Spoke with patient to discuss. She says this was discussed while she was here but her symptoms have persisted and have not gotten any better. She is aching all over and just tired to the point she does not feel like doing anything. Unable to sleep because she cant get comfortable. Tylenol and aleve help some but does not resolve. Her thyroid medication was changed. Does not know if this is related. She confirmed no other acute issues, she is wondering if there are labs that she can have done to make sure nothing acute is going on.

## 2020-12-29 NOTE — Telephone Encounter (Signed)
Patient aware of below. Referrals placed

## 2020-12-29 NOTE — Telephone Encounter (Signed)
The slight adjustment we made in her thyroid medication should not cause these symptoms.  She was experiencing symptoms at her last appt.  I checked inflammatory markers, etc.  All were negative.  Per last result note, I had recommended PT referral and rheumatology evaluation - for joint and muscle issues.  If agreeable, ok for referral.  If any acute change or problems, needs to be evaluated.

## 2021-01-11 ENCOUNTER — Telehealth: Payer: Self-pay

## 2021-01-11 NOTE — Telephone Encounter (Signed)
Reviewed note.  Make sure she is off crestor.  If I need to see her earlier - let me know.

## 2021-01-11 NOTE — Telephone Encounter (Signed)
FYI-  Patients daughter in law came into office requesting appt with Dr Lorin Picket for persistent issues patient has had going on. Daughter in law was not on DPR so was not able to discuss. Called patient to discuss and she did say that she is still having the persistent pain all over, using tylenol arthritis. She has appt with ortho 1/5 and rheumatology 1/16. Stated that she is going to try low dose of melatonin to see if it helps her rest at night. She has stopped her ozempic because she did not feel it was needed since her appetite has been decreased from the pain. Offered to schedule work in appt this week with pcp to re-evaluate. Patient stated that she did not feel that there was anything that we could do here. She is going to keep her ortho appt. Advised that I would see if there was any way that rheumatology could see her sooner. Patient will call back if she changes her mind about visit with PCP.

## 2021-01-12 NOTE — Telephone Encounter (Signed)
Patient aware of below and confirmed off of crestor.

## 2021-01-12 NOTE — Telephone Encounter (Signed)
Spoke with rheumatology to see if they could see her before 1/16. No appts available. Patient has been placed on cancellation list and is aware. Will let me know if she needs anything.

## 2021-01-13 DIAGNOSIS — M5416 Radiculopathy, lumbar region: Secondary | ICD-10-CM | POA: Diagnosis not present

## 2021-01-13 DIAGNOSIS — M160 Bilateral primary osteoarthritis of hip: Secondary | ICD-10-CM | POA: Diagnosis not present

## 2021-01-13 DIAGNOSIS — M25552 Pain in left hip: Secondary | ICD-10-CM | POA: Diagnosis not present

## 2021-01-13 DIAGNOSIS — Z6841 Body Mass Index (BMI) 40.0 and over, adult: Secondary | ICD-10-CM | POA: Diagnosis not present

## 2021-01-13 DIAGNOSIS — M25551 Pain in right hip: Secondary | ICD-10-CM | POA: Diagnosis not present

## 2021-01-17 ENCOUNTER — Ambulatory Visit: Payer: Medicare PPO

## 2021-01-17 DIAGNOSIS — R5383 Other fatigue: Secondary | ICD-10-CM | POA: Diagnosis not present

## 2021-01-17 DIAGNOSIS — M7918 Myalgia, other site: Secondary | ICD-10-CM | POA: Diagnosis not present

## 2021-01-17 DIAGNOSIS — M791 Myalgia, unspecified site: Secondary | ICD-10-CM | POA: Diagnosis not present

## 2021-01-17 DIAGNOSIS — M2550 Pain in unspecified joint: Secondary | ICD-10-CM | POA: Diagnosis not present

## 2021-01-24 DIAGNOSIS — R7982 Elevated C-reactive protein (CRP): Secondary | ICD-10-CM | POA: Diagnosis not present

## 2021-01-24 DIAGNOSIS — R700 Elevated erythrocyte sedimentation rate: Secondary | ICD-10-CM | POA: Diagnosis not present

## 2021-02-02 ENCOUNTER — Emergency Department: Payer: Medicare PPO

## 2021-02-02 ENCOUNTER — Emergency Department
Admission: EM | Admit: 2021-02-02 | Discharge: 2021-02-02 | Disposition: A | Discharge status: Home / Self Care | Payer: Medicare PPO | Attending: Emergency Medicine | Admitting: Emergency Medicine

## 2021-02-02 ENCOUNTER — Other Ambulatory Visit: Payer: Self-pay

## 2021-02-02 DIAGNOSIS — E039 Hypothyroidism, unspecified: Secondary | ICD-10-CM | POA: Diagnosis not present

## 2021-02-02 DIAGNOSIS — X58XXXA Exposure to other specified factors, initial encounter: Secondary | ICD-10-CM | POA: Insufficient documentation

## 2021-02-02 DIAGNOSIS — S0083XA Contusion of other part of head, initial encounter: Secondary | ICD-10-CM | POA: Insufficient documentation

## 2021-02-02 DIAGNOSIS — Z79899 Other long term (current) drug therapy: Secondary | ICD-10-CM | POA: Insufficient documentation

## 2021-02-02 DIAGNOSIS — Z7952 Long term (current) use of systemic steroids: Secondary | ICD-10-CM | POA: Insufficient documentation

## 2021-02-02 DIAGNOSIS — S161XXA Strain of muscle, fascia and tendon at neck level, initial encounter: Secondary | ICD-10-CM | POA: Insufficient documentation

## 2021-02-02 DIAGNOSIS — M47812 Spondylosis without myelopathy or radiculopathy, cervical region: Secondary | ICD-10-CM | POA: Diagnosis not present

## 2021-02-02 DIAGNOSIS — S060X9A Concussion with loss of consciousness of unspecified duration, initial encounter: Secondary | ICD-10-CM | POA: Insufficient documentation

## 2021-02-02 DIAGNOSIS — S0990XA Unspecified injury of head, initial encounter: Secondary | ICD-10-CM

## 2021-02-02 DIAGNOSIS — R55 Syncope and collapse: Secondary | ICD-10-CM | POA: Diagnosis not present

## 2021-02-02 DIAGNOSIS — R600 Localized edema: Secondary | ICD-10-CM | POA: Diagnosis not present

## 2021-02-02 LAB — CBC
HCT: 39.1 % (ref 36.0–46.0)
Hemoglobin: 12.1 g/dL (ref 12.0–15.0)
MCH: 27.8 pg (ref 26.0–34.0)
MCHC: 30.9 g/dL (ref 30.0–36.0)
MCV: 89.7 fL (ref 80.0–100.0)
Platelets: 318 10*3/uL (ref 150–400)
RBC: 4.36 MIL/uL (ref 3.87–5.11)
RDW: 15.2 % (ref 11.5–15.5)
WBC: 10.4 10*3/uL (ref 4.0–10.5)
nRBC: 0 % (ref 0.0–0.2)

## 2021-02-02 LAB — BASIC METABOLIC PANEL
Anion gap: 7 (ref 5–15)
BUN: 24 mg/dL — ABNORMAL HIGH (ref 8–23)
CO2: 28 mmol/L (ref 22–32)
Calcium: 9.1 mg/dL (ref 8.9–10.3)
Chloride: 99 mmol/L (ref 98–111)
Creatinine, Ser: 0.81 mg/dL (ref 0.44–1.00)
GFR, Estimated: >60 mL/min (ref >60)
Glucose, Bld: 158 mg/dL — ABNORMAL HIGH (ref 70–99)
Potassium: 4 mmol/L (ref 3.5–5.1)
Sodium: 134 mmol/L — ABNORMAL LOW (ref 135–145)

## 2021-02-02 LAB — TROPONIN I (HIGH SENSITIVITY): Troponin I (High Sensitivity): 9 ng/L (ref <18)

## 2021-02-02 LAB — BRAIN NATRIURETIC PEPTIDE: B Natriuretic Peptide: 108 pg/mL — ABNORMAL HIGH (ref 0.0–100.0)

## 2021-02-02 MED ORDER — LIDOCAINE 5 % EX PTCH
1.0000 | MEDICATED_PATCH | CUTANEOUS | Status: DC
  Administered 2021-02-02: 19:00:00 1 via TRANSDERMAL
  Filled 2021-02-02: qty 1

## 2021-02-02 NOTE — ED Notes (Signed)
Pulse ox 97% when ambulating

## 2021-02-02 NOTE — ED Provider Notes (Signed)
[facility] Provider Note    Event Date/Time   First MD Initiated Contact with Patient 02/02/21 1851     (approximate)   History   Loss of Consciousness   HPI  Jodi Harris is a 81 y.o. female with a past medical history of hypothyroidism and presumptive diagnosis of PMR currently on prednisone taper by rheumatologist and some insomnia currently on gabapentin also recently started about 2 to 3 weeks ago who presents accompanied by family for assessment after a syncopal episode that occurred earlier today.  Patient states that occurred sometime earlier this morning.  She remembers taking her Synthroid and walking around the kitchen and feeling very hot and lightheaded and then waking up on the ground with a headache and some right-sided neck discomfort.  He states that she actually took a nap and has been walking around without any subsequent headaches or dizziness but family informed her PCP who requested she come to the emergency room for evaluation.  She states otherwise she has been in her usual state of health without any recent fevers, cough, vomiting, chest pain, shortness of breath, abdominal pain, vomiting, diarrhea, constipation, urinary symptoms rash or new extremity pain with achiness in her joints is actually improving on the steroids he has been taking.  She does note that she has had some increased swelling in her bilateral lower legs over the last couple days which she attributes to her prednisone.  No recent EtOH use or other prior similar episodes.  No other acute concerns at this time.      Physical Exam  Triage Vital Signs: ED Triage Vitals [02/02/21 1748]  Enc Vitals Group     BP 118/62     Pulse Rate 81     Resp 18     Temp 98.7 F (37.1 C)     Temp Source Oral     SpO2 93 %     Weight      Height      Head Circumference      Peak Flow      Pain Score      Pain Loc      Pain Edu?      Excl. in GC?     Most recent vital  signs: Vitals:   02/02/21 1748 02/02/21 2026  BP: 118/62   Pulse: 81   Resp: 18   Temp: 98.7 F (37.1 C)   SpO2: 93% 97%    General: Awake, no distress.  CV:  Good peripheral perfusion.  2+ radial pulse.  No murmurs rubs or gallops. Resp:  Normal effort.  Clear bilaterally. Abd:  No distention.  Soft throughout. Other:  Right periorbital ecchymosis and edema.  PERRLA.  EOMI.  No evidence of entrapment.  Cranial nerves II through XII are grossly intact.  Oropharynx is unremarkable.  No other trauma to the face or scalp.  There is little tenderness over the right trapezius muscle without any midline tenderness or left-sided trapezius tenderness.  No tenderness over the T or L-spine.  Patient has full strength and range of motion throughout the bilateral upper and lower extremities.  2+ radial and DP pulses.  He has a little bit of ecchymosis over the bilateral knees but no effusion deformity or limitation range of motion or significant pain on ranging.  No other obvious trauma on exam.   ED Results / Procedures / Treatments  Labs (all labs ordered are listed, but only abnormal results are displayed) Labs  Reviewed  BASIC METABOLIC PANEL - Abnormal; Notable for the following components:      Result Value   Sodium 134 (*)    Glucose, Bld 158 (*)    BUN 24 (*)    All other components within normal limits  BRAIN NATRIURETIC PEPTIDE - Abnormal; Notable for the following components:   B Natriuretic Peptide 108.0 (*)    All other components within normal limits  CBC  URINALYSIS, ROUTINE W REFLEX MICROSCOPIC  CBG MONITORING, ED  TROPONIN I (HIGH SENSITIVITY)     EKG   EKG is remarkable for sinus rhythm with a ventricular rate of 81, slight left axis deviation, unremarkable intervals without evidence of acute ischemia or significant arrhythmia.   RADIOLOGY  CT head reviewed by myself shows no evidence of skull fracture, intracranial hemorrhage, mass-effect, edema, ischemia, or other  acute intracranial process.  There is evidence of some atrophy and stable vessel disease.  Also reviewed patient's CT C-spine which I did not find any acute fractures or traumatic subluxations.  There is appears to be some very mild anterior listhesis of C2 on C3 and C3 on C4 suspect is likely chronic without any other acute process.  Also reviewed radiology's interpretation of both of the studies and agree with their findings.  Chest x-ray reviewed by myself shows no focal consolidation, effusion, edema, pneumothorax or other clear acute thoracic process.  Heart size appears normal.  Also reviewed radiology's interpretation and agree with their findings.   PROCEDURES:  Critical Care performed: No  Procedures   MEDICATIONS ORDERED IN ED: Medications  lidocaine (LIDODERM) 5 % 1 patch (1 patch Transdermal Patch Applied 02/02/21 1918)     IMPRESSION / MDM / ASSESSMENT AND PLAN / ED COURSE  I reviewed the triage vital signs and the nursing notes.                              Differential diagnosis includes, but is not limited to syncope secondary to arrhythmia, anemia, ACS, PE, metabolic derangements, CHF/acute cardiomyopathy, polypharmacy, orthostasis and vasovagal.  No neurological deficits to suggest a CVA.  Patient does not appear intoxicated.  Regarding possible injuries in addition to obvious evidence of some bruising and swelling around the right eye and concern for possible skull fracture underlying intracranial hemorrhage as well as possible occult C-spine fracture.  She is otherwise neurovascular intact in all extremities other than some ecchymosis over the bilateral patella which I think is a contusion no other evidence of trauma and I have low suspicion or significant visceral trauma.  EKG is remarkable for sinus rhythm with a ventricular rate of 81, slight left axis deviation, unremarkable intervals without evidence of acute ischemia or significant arrhythmia.  CT head reviewed  by myself shows no evidence of skull fracture, intracranial hemorrhage, mass-effect, edema, ischemia, or other acute intracranial process.  There is evidence of some atrophy and stable vessel disease.  Also reviewed patient's CT C-spine which I did not find any acute fractures or traumatic subluxations.  There is appears to be some very mild anterior listhesis of C2 on C3 and C3 on C4 suspect is likely chronic without any other acute process.  Also reviewed radiology's interpretation of both of the studies and agree with their findings.  Chest x-ray reviewed by myself shows no focal consolidation, effusion, edema, pneumothorax or other clear acute thoracic process.  Heart size appears normal.  Also reviewed radiology's interpretation and agree with  their findings.  CBC without leukocytosis or acute anemia.  BMP without significant electrolyte or metabolic derangements.  BNP is just at the upper limit of normal and combined with chest x-ray findings and absence of any history of CHF is not suggestive of CHF playing a role today.  No evidence at this on work-up today.  Troponin is also not elevated and given absence of any chest pain and reassuring EKG I have low suspicion for ACS.  Patient was ambulated and able to maintain SPO2 greater than 95%.  Given no return of lightheadedness or dizziness or any other new symptoms over 3 hours in emergency room with otherwise reassuring exam work-up I think patient stable for discharge with close outpatient follow-up.  Unclear precise etiology although certainly vasovagal is within the differential.  Discharged in stable condition.  Strict return precautions advised and discussed.     FINAL CLINICAL IMPRESSION(S) / ED DIAGNOSES   Final diagnoses:  Syncope, unspecified syncope type  Injury of head, initial encounter  Strain of neck muscle, initial encounter  Facial hematoma, initial encounter     Rx / DC Orders   ED Discharge Orders     None         Note:  This document was prepared using Dragon voice recognition software and may include unintentional dictation errors.   Gilles Chiquito, MD 02/02/21 2052

## 2021-02-02 NOTE — ED Triage Notes (Signed)
Pt states she passed out and hit her head on the hard floor at home today, pt has bruising to the right side of her face and is c/o neck pain. Pt is a/ox4 at present, not on blood thinners

## 2021-02-03 DIAGNOSIS — M8588 Other specified disorders of bone density and structure, other site: Secondary | ICD-10-CM | POA: Diagnosis not present

## 2021-02-07 DIAGNOSIS — M85859 Other specified disorders of bone density and structure, unspecified thigh: Secondary | ICD-10-CM | POA: Diagnosis not present

## 2021-02-07 DIAGNOSIS — M353 Polymyalgia rheumatica: Secondary | ICD-10-CM | POA: Diagnosis not present

## 2021-02-07 DIAGNOSIS — Z7952 Long term (current) use of systemic steroids: Secondary | ICD-10-CM | POA: Diagnosis not present

## 2021-02-21 ENCOUNTER — Ambulatory Visit: Payer: Medicare PPO | Admitting: Internal Medicine

## 2021-02-21 ENCOUNTER — Other Ambulatory Visit: Payer: Self-pay

## 2021-02-21 DIAGNOSIS — E039 Hypothyroidism, unspecified: Secondary | ICD-10-CM

## 2021-02-21 DIAGNOSIS — R55 Syncope and collapse: Secondary | ICD-10-CM

## 2021-02-21 DIAGNOSIS — Z86010 Personal history of colonic polyps: Secondary | ICD-10-CM | POA: Diagnosis not present

## 2021-02-21 DIAGNOSIS — E7800 Pure hypercholesterolemia, unspecified: Secondary | ICD-10-CM

## 2021-02-21 DIAGNOSIS — F439 Reaction to severe stress, unspecified: Secondary | ICD-10-CM

## 2021-02-21 DIAGNOSIS — K219 Gastro-esophageal reflux disease without esophagitis: Secondary | ICD-10-CM | POA: Diagnosis not present

## 2021-02-21 DIAGNOSIS — E559 Vitamin D deficiency, unspecified: Secondary | ICD-10-CM | POA: Diagnosis not present

## 2021-02-21 DIAGNOSIS — M353 Polymyalgia rheumatica: Secondary | ICD-10-CM

## 2021-02-21 MED ORDER — LEVOTHYROXINE SODIUM 125 MCG PO TABS: 125.0000 ug | ORAL_TABLET | Freq: Every day | ORAL | 1 refills | Status: DC

## 2021-02-21 NOTE — Progress Notes (Signed)
Patient ID: Jodi Harris, female   DOB: [birth year], 81 y.o.   MRN: [id number]   Subjective:    Patient ID: Jodi Harris, female    DOB: [birth year], 81 y.o.   MRN: [id number]  This visit occurred during the SARS-CoV-2 public health emergency.  Safety protocols were in place, including screening questions prior to the visit, additional usage of staff PPE, and extensive cleaning of exam room while observing appropriate contact time as indicated for disinfecting solutions.   Patient here for a scheduled follow up.   Chief Complaint  Patient presents with   PMR   .   HPI Recently diagnosed with suspected PMR.  Seeing rheumatology.  On prednisone 12.5mg  q day.  She is doing some better, but still with hand stiffness and pain and shoulder/arm pain.  Unable to grip.  Unable to drive - cannot grip steering wheel.  Limiting active at home.  Discussed PT/OT - home health - to help with ADLs, etc.  She did have a syncopal episode recently.  States the day occurred, she did not feel right.  Had not eaten a lot.  Smelled/tasted - bad pancake.  Felt funny and sat down and then remembers coming to on the floor.  ER w/u - CT head - no acute intracranial findings.  CT neck - cervical spondylosis, no fracture.  CXR - negative.  Did hit her head.  Bruise around her right eye. Resolving hematoma.  Still with some pain over the site, but no increased headache.  Still occasionally will feel a little off, but is not sure if this is related to prednisone.  No significant dizziness or light headedness.  No chest pain reported.  Breathing stable.  Eating better.  No vomiting.  No abdominal pain.  No increased bowel problems reported.  Had questions about fosamax and treatment for osteopenia.  Discussed treatment options and possible side effects.    Past Medical History:  Diagnosis Date   Anemia    Diverticulosis    GERD (gastroesophageal reflux disease)    History of colonic polyps    Hypercholesterolemia     Hypothyroidism    Internal hemorrhoids    Thrombophlebitis    varicosities   Past Surgical History:  Procedure Laterality Date   COLON SURGERY     laparoscopic assisted transverse colectomy for tubular adenomatous polyp   COLONOSCOPY     COLONOSCOPY WITH PROPOFOL N/A 07/29/2018   Procedure: COLONOSCOPY WITH PROPOFOL;  Surgeon: Lollie Sails, MD;  Location: [facility] ENDOSCOPY;  Service: Endoscopy;  Laterality: N/A;   DIAGNOSTIC LAPAROSCOPY     LAPAROSCOPIC GASTRIC BANDING     URETHRAL DIVERTICULUM REPAIR  1974   Dr Redmond Baseman   Family History  Problem Relation Age of Onset   Cancer Father        lymphoma   Cancer Maternal Aunt        Breast   Breast cancer Maternal Aunt 95   Colon cancer Other        one relative on maternal side   Social History   Socioeconomic History   Marital status: Married    Spouse name: Not on file   Number of children: 2   Years of education: Not on file   Highest education level: Not on file  Occupational History   Not on file  Tobacco Use   Smoking status: Never   Smokeless tobacco: Never   Tobacco comments:    smoked as a teenager  Vaping Use  Vaping Use: Never used  Substance and Sexual Activity   Alcohol use: Not Currently    Alcohol/week: 0.0 standard drinks    Comment: has a glass of wine about once a month   Drug use: No   Sexual activity: Never  Other Topics Concern   Not on file  Social History Narrative   Not on file   Social Determinants of Health   Financial Resource Strain: Low Risk    Difficulty of Paying Living Expenses: Not hard at all  Food Insecurity: No Food Insecurity   Worried About Programme researcher, broadcasting/film/video in the Last Year: Never true   Ran Out of Food in the Last Year: Never true  Transportation Needs: No Transportation Needs   Lack of Transportation (Medical): No   Lack of Transportation (Non-Medical): No  Physical Activity: Insufficiently Active   Days of Exercise per Week: 1 day   Minutes of Exercise per  Session: 30 min  Stress: No Stress Concern Present   Feeling of Stress : Not at all  Social Connections: Unknown   Frequency of Communication with Friends and Family: More than three times a week   Frequency of Social Gatherings with Friends and Family: More than three times a week   Attends Religious Services: Not on Scientist, clinical (histocompatibility and immunogenetics) or Organizations: Not on file   Attends Banker Meetings: Not on file   Marital Status: Married     Review of Systems  Constitutional:  Negative for appetite change and unexpected weight change.  HENT:  Negative for congestion and sinus pressure.        Minimal tenderness to palpation - above right eye.(Improved).   Respiratory:  Negative for cough, chest tightness and shortness of breath.   Cardiovascular:  Negative for chest pain, palpitations and leg swelling.  Gastrointestinal:  Negative for abdominal pain, diarrhea, nausea and vomiting.  Genitourinary:  Negative for difficulty urinating and dysuria.  Musculoskeletal:        Increased joint stiffness - hands.  Discomfort - neck/shoulders.   Skin:  Negative for rash.       Resolving hematoma - right peri orbital region.   Neurological:        No significant dizziness or headache.   Psychiatric/Behavioral:  Negative for agitation and dysphoric mood.       Objective:     BP 132/74    Pulse 82    Temp 97.8 F (36.6 C)    Resp 16    Ht 5\' 4"  (1.626 m)    Wt 233 lb 9.6 oz (106 kg)    LMP 02/01/1993    SpO2 96%    BMI 40.10 kg/m  Wt Readings from Last 3 Encounters:  02/21/21 233 lb 9.6 oz (106 kg)  12/20/20 233 lb (105.7 kg)  12/09/20 240 lb 8 oz (109.1 kg)    Physical Exam Vitals reviewed.  Constitutional:      General: She is not in acute distress.    Appearance: Normal appearance.  HENT:     Head: Normocephalic.     Comments: Resolving hematoma - peri orbital region.  Minimal tenderness to palpation above the right eye (improved).     Right Ear: External ear  normal.     Left Ear: External ear normal.  Eyes:     General: No scleral icterus.       Right eye: No discharge.        Left eye: No discharge.  Conjunctiva/sclera: Conjunctivae normal.  Neck:     Thyroid: No thyromegaly.  Cardiovascular:     Rate and Rhythm: Normal rate and regular rhythm.  Pulmonary:     Effort: No respiratory distress.     Breath sounds: Normal breath sounds. No wheezing.  Abdominal:     General: Bowel sounds are normal.     Palpations: Abdomen is soft.     Tenderness: There is no abdominal tenderness.  Musculoskeletal:        General: No swelling.     Cervical back: Neck supple. No tenderness.     Comments: Unable to make a fist.    Lymphadenopathy:     Cervical: No cervical adenopathy.  Skin:    Findings: No erythema or rash.     Comments: Resolving hematoma - peri orbital region.   Neurological:     Mental Status: She is alert.  Psychiatric:        Mood and Affect: Mood normal.        Behavior: Behavior normal.     Outpatient Encounter Medications as of 02/21/2021  Medication Sig   predniSONE (DELTASONE) 2.5 MG tablet Take by mouth. Take 5 tablets (12.5 mg total) by mouth once daily for 30 days, THEN 4 tablets (10 mg total) once daily for 30 days   Cholecalciferol (VITAMIN D-3 PO) Take 2,000 Units by mouth daily.   esomeprazole (NEXIUM) 40 MG capsule Take 1 capsule (40 mg total) by mouth daily.   fluticasone (FLONASE) 50 MCG/ACT nasal spray Place 2 sprays into both nostrils daily as needed.   levothyroxine (SYNTHROID) 125 MCG tablet Take 1 tablet (125 mcg total) by mouth daily before breakfast.   [DISCONTINUED] levothyroxine (SYNTHROID) 125 MCG tablet Take 1 tablet (125 mcg total) by mouth daily before breakfast.   [DISCONTINUED] rosuvastatin (CRESTOR) 5 MG tablet Take 1 tablet (5 mg total) by mouth daily. (Patient not taking: Reported on 12/20/2020)   [DISCONTINUED] Semaglutide,0.25 or 0.5MG /DOS, (OZEMPIC, 0.25 OR 0.5 MG/DOSE,) 2 MG/1.5ML SOPN  Inject 0.5 mg into the skin once a week.   No facility-administered encounter medications on file as of 02/21/2021.     Lab Results  Component Value Date   WBC 10.4 02/02/2021   HGB 12.1 02/02/2021   HCT 39.1 02/02/2021   PLT 318 02/02/2021   GLUCOSE 158 (H) 02/02/2021   CHOL 132 12/07/2020   TRIG 69.0 12/07/2020   HDL 49.30 12/07/2020   LDLDIRECT 129.8 02/15/2012   LDLCALC 69 12/07/2020   ALT 8 12/07/2020   AST 14 12/07/2020   NA 134 (L) 02/02/2021   K 4.0 02/02/2021   CL 99 02/02/2021   CREATININE 0.81 02/02/2021   BUN 24 (H) 02/02/2021   CO2 28 02/02/2021   TSH 0.12 (L) 12/07/2020    DG Chest 2 View  Result Date: 02/02/2021 CLINICAL DATA:  Syncope EXAM: CHEST - 2 VIEW COMPARISON:  05/05/2014 FINDINGS: Linear scar in the right infrahilar region. No confluent airspace opacities. No effusions. Heart is normal size. Scattered aortic calcifications. IMPRESSION: No active cardiopulmonary disease. Electronically Signed   By: Rolm Baptise M.D.   On: 02/02/2021 19:31   CT HEAD WO CONTRAST  Result Date: 02/02/2021 CLINICAL DATA:  Syncope, trauma EXAM: CT HEAD WITHOUT CONTRAST TECHNIQUE: Contiguous axial images were obtained from the base of the skull through the vertex without intravenous contrast. RADIATION DOSE REDUCTION: This exam was performed according to the departmental dose-optimization program which includes automated exposure control, adjustment of the mA and/or kV according to  patient size and/or use of iterative reconstruction technique. COMPARISON:  None. FINDINGS: Brain: No acute intracranial findings are seen. Cortical sulci are prominent. There is decreased density in the subcortical and periventricular white matter. Vascular: There are scattered arterial calcifications. Skull: Unremarkable. Sinuses/Orbits: Unremarkable. Other: There is increased amount of CSF insula suggesting partial empty sella. IMPRESSION: No acute intracranial findings are seen in noncontrast CT brain.  Atrophy. Small-vessel disease. Electronically Signed   By: Elmer Picker M.D.   On: 02/02/2021 18:16   CT Cervical Spine Wo Contrast  Result Date: 02/02/2021 CLINICAL DATA:  Fall, neck trauma. EXAM: CT CERVICAL SPINE WITHOUT CONTRAST TECHNIQUE: Multidetector CT imaging of the cervical spine was performed without intravenous contrast. Multiplanar CT image reconstructions were also generated. RADIATION DOSE REDUCTION: This exam was performed according to the departmental dose-optimization program which includes automated exposure control, adjustment of the mA and/or kV according to patient size and/or use of iterative reconstruction technique. COMPARISON:  None. FINDINGS: Alignment: Mild anterolisthesis C2-3 and C3-4 Skull base and vertebrae: Negative for fracture or mass Soft tissues and spinal canal: Negative for soft tissue mass or edema. Atherosclerosis. Disc levels: Disc and facet degeneration throughout the cervical spine. Foraminal narrowing bilaterally C5-6 and C6-7 due to spurring. Upper chest: Lung apices clear bilaterally Other: None IMPRESSION: Cervical spondylosis.  Negative for fracture. Electronically Signed   By: Franchot Gallo M.D.   On: 02/02/2021 18:21       Assessment & Plan:   Problem List Items Addressed This Visit     GERD (gastroesophageal reflux disease)    On nexium.  Upper symptoms controlled.       History of colon polyps    Had colonoscopy 07/2018 - redundant colon and diverticulosis.  F/u recommended in 5 years.        Hypercholesterolemia    Off crestor currently.  Will hold on restarting given current issues with muscle aching and joint stiffness.  Low cholesterol diet and exercise.  Follow lipid panel.       Relevant Orders   Lipid panel   Hepatic function panel   Basic metabolic panel   Hypothyroidism    On thyroid replacement.  Follow tsh.       Relevant Medications   levothyroxine (SYNTHROID) 125 MCG tablet   Other Relevant Orders   TSH   PMR  (polymyalgia rheumatica) (HCC)    Recently diagnosed.  On prednisone.  Being followed by rheumatology.  With increased hand stiffness, joint stiffness and muscle aches - unable to make a fist, etc.  Hard to pick up things and grip things.  Weakness - hard to get up out of a chair. Unable to drive - due to not being able to grip the steering wheel.  Discussed home health PT/OT to evaluate and treat to help with ADLs, etc.        Relevant Orders   Ambulatory referral to [place]   CBC with Differential/Platelet   Severe obesity (BMI >= 40) (HCC)    Is s/p lap band surgery.  On ozempic.  Weight is down.  Discussed diet and exercise.  Follow.        Stress    On prozac. Appears to be doing well.  Follow.        Syncope    Had syncopal episode as outlined.  Unclear etiology.  Question of vasovagal episode.  Discussed with her today.  W/up in ER reviewed and as outlined.  Discussed further cardiac w/up and neurological w/up.  Wants to monitor.  Wants to hold on further w/up at this time.  Follow.        Vitamin D deficiency    Vitamin D supplements.        I spent 45 minutes with the patient and more than 50% of the time was spent in consultation regarding the above.  Time spent discussing her current concerns and symptoms.  Time also spent discussing further w/up, evaluation and treatment.   Einar Pheasant, MD

## 2021-02-22 ENCOUNTER — Encounter: Payer: Self-pay | Admitting: Internal Medicine

## 2021-02-22 DIAGNOSIS — R55 Syncope and collapse: Secondary | ICD-10-CM | POA: Insufficient documentation

## 2021-02-22 DIAGNOSIS — M353 Polymyalgia rheumatica: Secondary | ICD-10-CM | POA: Insufficient documentation

## 2021-02-22 NOTE — Assessment & Plan Note (Signed)
Off crestor currently.  Will hold on restarting given current issues with muscle aching and joint stiffness.  Low cholesterol diet and exercise.  Follow lipid panel.

## 2021-02-22 NOTE — Assessment & Plan Note (Signed)
Recently diagnosed.  On prednisone.  Being followed by rheumatology.  With increased hand stiffness, joint stiffness and muscle aches - unable to make a fist, etc.  Hard to pick up things and grip things.  Weakness - hard to get up out of a chair. Unable to drive - due to not being able to grip the steering wheel.  Discussed home health PT/OT to evaluate and treat to help with ADLs, etc.

## 2021-02-22 NOTE — Assessment & Plan Note (Signed)
On nexium.  Upper symptoms controlled.  

## 2021-02-22 NOTE — Assessment & Plan Note (Signed)
On prozac.  Appears to be doing well.  Follow.  

## 2021-02-22 NOTE — Assessment & Plan Note (Signed)
On thyroid replacement.  Follow tsh.  

## 2021-02-22 NOTE — Assessment & Plan Note (Signed)
Is s/p lap band surgery.  On ozempic.  Weight is down.  Discussed diet and exercise.  Follow.   

## 2021-02-22 NOTE — Assessment & Plan Note (Signed)
Had syncopal episode as outlined.  Unclear etiology.  Question of vasovagal episode.  Discussed with her today.  W/up in ER reviewed and as outlined.  Discussed further cardiac w/up and neurological w/up.  Wants to monitor.  Wants to hold on further w/up at this time.  Follow.

## 2021-02-22 NOTE — Assessment & Plan Note (Signed)
Had colonoscopy 07/2018 - redundant colon and diverticulosis.  F/u recommended in 5 years.   

## 2021-02-22 NOTE — Assessment & Plan Note (Signed)
Vitamin D supplements.  

## 2021-02-23 ENCOUNTER — Ambulatory Visit (INDEPENDENT_AMBULATORY_CARE_PROVIDER_SITE_OTHER): Payer: Medicare PPO

## 2021-02-23 VITALS — Ht 64.0 in | Wt 233.0 lb

## 2021-02-23 DIAGNOSIS — Z0000 Encounter for general adult medical examination without abnormal findings: Secondary | ICD-10-CM | POA: Diagnosis not present

## 2021-02-23 NOTE — Progress Notes (Signed)
Subjective:   Jodi Harris is a 81 y.o. female who presents for Medicare Annual (Subsequent) preventive examination.  Review of Systems    No ROS.  Medicare Wellness Virtual Visit.  Visual/audio telehealth visit, UTA vital signs.   See social history for additional risk factors.   Cardiac Risk Factors include: advanced age (>80men, >33 women)     Objective:    Today's Vitals   02/23/21 1319  Weight: 233 lb (105.7 kg)  Height: 5\' 4"  (1.626 m)   Body mass index is 39.99 kg/m.  Advanced Directives 02/23/2021 02/02/2021 02/23/2020 02/20/2019 07/29/2018 02/18/2018 02/15/2017  Does Patient Have a Medical Advance Directive? Yes No Yes Yes Yes Yes Yes  Type of Advance Directive - - Healthcare Power of [place];Living will Healthcare Power of [place];Living will Healthcare Power of [place];Living will Healthcare Power of [place];Living will Healthcare Power of [place];Living will  Does patient want to make changes to medical advance directive? No - Patient declined - No - Patient declined No - Patient declined - No - Patient declined No - Patient declined  Copy of Healthcare Power of Attorney in Chart? - - No - copy requested No - copy requested No - copy requested No - copy requested No - copy requested  Would patient like information on creating a medical advance directive? - No - Patient declined - - - - -    Current Medications (verified) Outpatient Encounter Medications as of 02/23/2021  Medication Sig   Cholecalciferol (VITAMIN D-3 PO) Take 2,000 Units by mouth daily.   esomeprazole (NEXIUM) 40 MG capsule Take 1 capsule (40 mg total) by mouth daily.   fluticasone (FLONASE) 50 MCG/ACT nasal spray Place 2 sprays into both nostrils daily as needed.   levothyroxine (SYNTHROID) 125 MCG tablet Take 1 tablet (125 mcg total) by mouth daily before breakfast.   predniSONE (DELTASONE) 2.5 MG tablet Take by mouth. Take 5 tablets (12.5 mg total) by mouth once daily for 30 days, THEN 4  tablets (10 mg total) once daily for 30 days   No facility-administered encounter medications on file as of 02/23/2021.    Allergies (verified) Codeine sulfate and Penicillins   History: Past Medical History:  Diagnosis Date   Anemia    Diverticulosis    GERD (gastroesophageal reflux disease)    History of colonic polyps    Hypercholesterolemia    Hypothyroidism    Internal hemorrhoids    Thrombophlebitis    varicosities   Past Surgical History:  Procedure Laterality Date   COLON SURGERY     laparoscopic assisted transverse colectomy for tubular adenomatous polyp   COLONOSCOPY     COLONOSCOPY WITH PROPOFOL N/A 07/29/2018   Procedure: COLONOSCOPY WITH PROPOFOL;  Surgeon: 07/31/2018, MD;  Location: [facility] END[facility]COPY;  Service: Endoscopy;  Laterality: N/A;   DIAGNOSTIC LAPAROSCOPY     LAPAROSCOPIC GASTRIC BANDING     URETHRAL DIVERTICULUM REPAIR  1974   Dr [facility]   Family History  Problem Relation Age of Onset   Cancer Father        lymphoma   Cancer Maternal Aunt        Breast   Breast cancer Maternal Aunt 31   Colon cancer Other        one relative on maternal side   Social History   Socioeconomic History   Marital status: Married    Spouse name: Not on file   Number of children: 2   Years of education: Not on file  Highest education level: Not on file  Occupational History   Not on file  Tobacco Use   Smoking status: Never   Smokeless tobacco: Never   Tobacco comments:    smoked as a teenager  Vaping Use   Vaping Use: Never used  Substance and Sexual Activity   Alcohol use: Not Currently    Alcohol/week: 0.0 standard drinks    Comment: has a glass of wine about once a month   Drug use: No   Sexual activity: Never  Other Topics Concern   Not on file  Social History Narrative   Not on file   Social Determinants of Health   Financial Resource Strain: Low Risk    Difficulty of Paying Living Expenses: Not hard at all  Food Insecurity: No Food  Insecurity   Worried About Programme researcher, broadcasting/film/video in the Last Year: Never true   Ran Out of Food in the Last Year: Never true  Transportation Needs: No Transportation Needs   Lack of Transportation (Medical): No   Lack of Transportation (Non-Medical): No  Physical Activity: Insufficiently Active   Days of Exercise per Week: 1 day   Minutes of Exercise per Session: 30 min  Stress: Not on file  Social Connections: Not on file    Tobacco Counseling Counseling given: Not Answered Tobacco comments: smoked as a teenager   Clinical Intake:  Pre-visit preparation completed: Yes        Diabetes: No  How often do you need to have someone help you when you read instructions, pamphlets, or other written materials from your doctor or pharmacy?: 1 - Never  Interpreter Needed?: No      Activities of Daily Living In your present state of health, do you have any difficulty performing the following activities: 02/23/2021  Hearing? N  Vision? N  Difficulty concentrating or making decisions? N  Walking or climbing stairs? N  Dressing or bathing? N  Doing errands, shopping? Y  Comment Currently not running errands alone  Preparing Food and eating ? N  Using the Toilet? N  In the past six months, have you accidently leaked urine? N  Do you have problems with loss of bowel control? N  Managing your Medications? N  Managing your Finances? N  Housekeeping or managing your Housekeeping? N  Some recent data might be hidden    Patient Care Team: Dale [place], MD as PCP - General (Internal Medicine) Lourena Simmonds, RPH-CPP (Pharmacist)  Indicate any recent Medical Services you may have received from other than [facility] providers in the past year (date may be approximate).     Assessment:   This is a routine wellness examination for [name].  Virtual Visit via Telephone Note  I connected with  Malachi Suderman Bertini on 02/23/21 at  1:15 PM EST by telephone and verified that I am  speaking with the correct person using two identifiers.  Persons participating in the virtual visit: patient/Nurse Health Advisor   I discussed the limitations, risks, security and privacy concerns of performing an evaluation and management service by telephone and the availability of in person appointments. The patient expressed understanding and agreed to proceed.  Interactive audio and video telecommunications were attempted between this nurse and patient, however failed, due to patient having technical difficulties OR patient did not have access to video capability.  We continued and completed visit with audio only.  Some vital signs may be absent or patient reported.   Hearing/Vision screen Hearing Screening - Comments::  Patient is able to hear conversational tones without difficulty. No issues reported. Vision Screening - Comments:: Followed by Dr. Julianne RiceWoodard, [place]  Followed by Dr. Druscilla BrowniePorfilio, [facility]  Dietary issues and exercise activities discussed: Current Exercise Habits: Home exercise routine, Type of exercise: stretching, Intensity: Mild   Goals Addressed               This Visit's Progress     Patient Stated     Increase physical activity (pt-stated)        Walk more for exercise        Depression Screen PHQ 2/9 Scores 02/23/2021 12/09/2020 02/23/2020 12/29/2019 02/20/2019 02/18/2018 02/15/2017  PHQ - 2 Score 0 0 0 0 0 0 0    Fall Risk Fall Risk  02/23/2021 12/09/2020 02/23/2020 02/20/2019 02/18/2018  Falls in the past year? - 0 0 0 0  Number falls in past yr: - 0 0 - -  Injury with Fall? - 0 0 - -  Risk for fall due to : - No Fall Risks - - -  Follow up Falls evaluation completed Falls evaluation completed Falls evaluation completed Falls evaluation completed -    FALL RISK PREVENTION PERTAINING TO THE HOME: Home free of loose throw rugs in walkways, pet beds, electrical cords, etc? Yes  Adequate lighting in your home to reduce risk of falls? Yes    ASSISTIVE DEVICES UTILIZED TO PREVENT FALLS: Use of a cane, walker or w/c? No   TIMED UP AND GO: Was the test performed? No .   Cognitive Function: Patient is alert and oriented x3.  MMSE - Mini Mental State Exam 02/15/2017 01/29/2015  Orientation to time 5 5  Orientation to Place 5 5  Registration 3 3  Attention/ Calculation 5 5  Recall 3 3  Language- name 2 objects 2 2  Language- repeat 1 1  Language- follow 3 step command 3 3  Language- read & follow direction 1 1  Write a sentence 1 1  Copy design 1 1  Total score 30 30     6CIT Screen 02/20/2019 02/18/2018 02/14/2016  What Year? 0 points 0 points 0 points  What month? 0 points 0 points 0 points  What time? 0 points 0 points 0 points  Count back from 20 0 points 0 points 0 points  Months in reverse 0 points 0 points 0 points  Repeat phrase 0 points 0 points 0 points  Total Score 0 0 0   Immunizations Immunization History  Administered Date(s) Administered   Fluad Quad(high Dose 65+) 10/27/2020   Influenza Split 12/03/2011, 11/12/2012, 11/13/2013   Influenza, High Dose Seasonal PF 11/23/2017   Influenza,inj,quad, With Preservative 11/17/2016, 10/23/2018   Influenza-Unspecified 11/14/2014, 11/04/2015, 11/28/2019   Moderna Covid-19 Vaccine Bivalent Booster 3466yrs & up 11/22/2020   Moderna Sars-Covid-2 Vaccination 01/21/2019, 02/18/2019, 11/06/2019   Pneumococcal Conjugate-13 04/24/2013   Pneumococcal Polysaccharide-23 02/02/2012   Tdap 03/10/2010, 09/01/2020   Zoster Recombinat (Shingrix) 12/14/2017, 03/13/2018, 06/20/2018   Zoster, Live 07/01/2008   Screening Tests Health Maintenance  Topic Date Due   MAMMOGRAM  11/11/2021   COLONOSCOPY (Pts 45-6135yrs Insurance coverage will need to be confirmed)  07/29/2023   TETANUS/TDAP  09/02/2030   Pneumonia Vaccine 7565+ Years old  Completed   INFLUENZA VACCINE  Completed   DEXA SCAN  Completed   COVID-19 Vaccine  Completed   Zoster Vaccines- Shingrix  Completed   HPV  VACCINES  Aged Out   Health Maintenance There are no preventive care  reminders to display for this patient.  Lung Cancer Screening: (Low Dose CT Chest recommended if Age 41-80 years, 30 pack-year currently smoking OR have quit w/in 15years.) does not qualify.   Vision Screening: Recommended annual ophthalmology exams for early detection of glaucoma and other disorders of the eye.  Dental Screening: Recommended annual dental exams for proper oral hygiene  Community Resource Referral / Chronic Care Management: CRR required this visit?  No   CCM required this visit?  No      Plan:   Keep all routine maintenance appointments.   I have personally reviewed and noted the following in the patients chart:   Medical and social history Use of alcohol, tobacco or illicit drugs  Current medications and supplements including opioid prescriptions. Not taking opioid.  Functional ability and status Nutritional status Physical activity Advanced directives List of other physicians Hospitalizations, surgeries, and ER visits in previous 12 months Vitals Screenings to include cognitive, depression, and falls Referrals and appointments  In addition, I have reviewed and discussed with patient certain preventive protocols, quality metrics, and best practice recommendations. A written personalized care plan for preventive services as well as general preventive health recommendations were provided to patient via mychart.     Ashok Pall, LPN   5/63/8937

## 2021-02-23 NOTE — Patient Instructions (Addendum)
Jodi Harris , Thank you for taking time to come for your Medicare Wellness Visit. I appreciate your ongoing commitment to your health goals. Please review the following plan we discussed and let me know if I can assist you in the future.   These are the goals we discussed:  Goals       Patient Stated     Increase physical activity (pt-stated)      Walk more for exercise       Medication Monitoring (pt-stated)      Patient Goals/Self-Care Activities Over the next 90 days, patient will:  - target a minimum of 150 minutes of moderate intensity exercise weekly engage in dietary modifications by reduction in portion sizes, snacking - communicate concerns with care team        This is a list of the screening recommended for you and due dates:  Health Maintenance  Topic Date Due   Mammogram  11/11/2021   Colon Cancer Screening  07/29/2023   Tetanus Vaccine  09/02/2030   Pneumonia Vaccine  Completed   Flu Shot  Completed   DEXA scan (bone density measurement)  Completed   COVID-19 Vaccine  Completed   Zoster (Shingles) Vaccine  Completed   HPV Vaccine  Aged Out    Advanced directives: End of life planning; Advance aging; Advanced directives discussed.  Copy of current HCPOA/Living Will requested.    Conditions/risks identified: none new.  Follow up in one year for your annual wellness visit.   Preventive Care 15 Years and Older, Female Preventive care refers to lifestyle choices and visits with your health care provider that can promote health and wellness. What does preventive care include? A yearly physical exam. This is also called an annual well check. Dental exams once or twice a year. Routine eye exams. Ask your health care provider how often you should have your eyes checked. Personal lifestyle choices, including: Daily care of your teeth and gums. Regular physical activity. Eating a healthy diet. Avoiding tobacco and drug use. Limiting alcohol use. Practicing  safe sex. Taking low-dose aspirin every day. Taking vitamin and mineral supplements as recommended by your health care provider. What happens during an annual well check? The services and screenings done by your health care provider during your annual well check will depend on your age, overall health, lifestyle risk factors, and family history of disease. Counseling  Your health care provider may ask you questions about your: Alcohol use. Tobacco use. Drug use. Emotional well-being. Home and relationship well-being. Sexual activity. Eating habits. History of falls. Memory and ability to understand (cognition). Work and work Astronomer. Reproductive health. Screening  You may have the following tests or measurements: Height, weight, and BMI. Blood pressure. Lipid and cholesterol levels. These may be checked every 5 years, or more frequently if you are over 4 years old. Skin check. Lung cancer screening. You may have this screening every year starting at age 19 if you have a 30-pack-year history of smoking and currently smoke or have quit within the past 15 years. Fecal occult blood test (FOBT) of the stool. You may have this test every year starting at age 32. Flexible sigmoidoscopy or colonoscopy. You may have a sigmoidoscopy every 5 years or a colonoscopy every 10 years starting at age 6. Hepatitis C blood test. Hepatitis B blood test. Sexually transmitted disease (STD) testing. Diabetes screening. This is done by checking your blood sugar (glucose) after you have not eaten for a while (fasting). You may have  this done every 1-3 years. Bone density scan. This is done to screen for osteoporosis. You may have this done starting at age 72. Mammogram. This may be done every 1-2 years. Talk to your health care provider about how often you should have regular mammograms. Talk with your health care provider about your test results, treatment options, and if necessary, the need for more  tests. Vaccines  Your health care provider may recommend certain vaccines, such as: Influenza vaccine. This is recommended every year. Tetanus, diphtheria, and acellular pertussis (Tdap, Td) vaccine. You may need a Td booster every 10 years. Zoster vaccine. You may need this after age 24. Pneumococcal 13-valent conjugate (PCV13) vaccine. One dose is recommended after age 16. Pneumococcal polysaccharide (PPSV23) vaccine. One dose is recommended after age 23. Talk to your health care provider about which screenings and vaccines you need and how often you need them. This information is not intended to replace advice given to you by your health care provider. Make sure you discuss any questions you have with your health care provider. Document Released: 01/22/2015 Document Revised: 09/15/2015 Document Reviewed: 10/27/2014 Elsevier Interactive Patient Education  2017 [place] Prevention in the Home Falls can cause injuries. They can happen to people of all ages. There are many things you can do to make your home safe and to help prevent falls. What can I do on the outside of my home? Regularly fix the edges of walkways and driveways and fix any cracks. Remove anything that might make you trip as you walk through a door, such as a raised step or threshold. Trim any bushes or trees on the path to your home. Use bright outdoor lighting. Clear any walking paths of anything that might make someone trip, such as rocks or tools. Regularly check to see if handrails are loose or broken. Make sure that both sides of any steps have handrails. Any raised decks and porches should have guardrails on the edges. Have any leaves, snow, or ice cleared regularly. Use sand or salt on walking paths during winter. Clean up any spills in your garage right away. This includes oil or grease spills. What can I do in the bathroom? Use night lights. Install grab bars by the toilet and in the tub and shower.  Do not use towel bars as grab bars. Use non-skid mats or decals in the tub or shower. If you need to sit down in the shower, use a plastic, non-slip stool. Keep the floor dry. Clean up any water that spills on the floor as soon as it happens. Remove soap buildup in the tub or shower regularly. Attach bath mats securely with double-sided non-slip rug tape. Do not have throw rugs and other things on the floor that can make you trip. What can I do in the bedroom? Use night lights. Make sure that you have a light by your bed that is easy to reach. Do not use any sheets or blankets that are too big for your bed. They should not hang down onto the floor. Have a firm chair that has side arms. You can use this for support while you get dressed. Do not have throw rugs and other things on the floor that can make you trip. What can I do in the kitchen? Clean up any spills right away. Avoid walking on wet floors. Keep items that you use a lot in easy-to-reach places. If you need to reach something above you, use a strong step stool  that has a grab bar. Keep electrical cords out of the way. Do not use floor polish or wax that makes floors slippery. If you must use wax, use non-skid floor wax. Do not have throw rugs and other things on the floor that can make you trip. What can I do with my stairs? Do not leave any items on the stairs. Make sure that there are handrails on both sides of the stairs and use them. Fix handrails that are broken or loose. Make sure that handrails are as long as the stairways. Check any carpeting to make sure that it is firmly attached to the stairs. Fix any carpet that is loose or worn. Avoid having throw rugs at the top or bottom of the stairs. If you do have throw rugs, attach them to the floor with carpet tape. Make sure that you have a light switch at the top of the stairs and the bottom of the stairs. If you do not have them, ask someone to add them for you. What else  can I do to help prevent falls? Wear shoes that: Do not have high heels. Have rubber bottoms. Are comfortable and fit you well. Are closed at the toe. Do not wear sandals. If you use a stepladder: Make sure that it is fully opened. Do not climb a closed stepladder. Make sure that both sides of the stepladder are locked into place. Ask someone to hold it for you, if possible. Clearly mark and make sure that you can see: Any grab bars or handrails. First and last steps. Where the edge of each step is. Use tools that help you move around (mobility aids) if they are needed. These include: Canes. Walkers. Scooters. Crutches. Turn on the lights when you go into a dark area. Replace any light bulbs as soon as they burn out. Set up your furniture so you have a clear path. Avoid moving your furniture around. If any of your floors are uneven, fix them. If there are any pets around you, be aware of where they are. Review your medicines with your doctor. Some medicines can make you feel dizzy. This can increase your chance of falling. Ask your doctor what other things that you can do to help prevent falls. This information is not intended to replace advice given to you by your health care provider. Make sure you discuss any questions you have with your health care provider. Document Released: 10/22/2008 Document Revised: 06/03/2015 Document Reviewed: 01/30/2014 Elsevier Interactive Patient Education  2017 [employer].

## 2021-03-15 ENCOUNTER — Ambulatory Visit: Payer: Self-pay | Admitting: Pharmacist

## 2021-03-15 NOTE — Chronic Care Management (AMB) (Signed)
?  Chronic Care Management  ? ?Note ? ?03/15/2021 ?Name: Dametra Whetsel MRN: [id number] DOB: [birth year] ? ? ? ?Closing pharmacy CCM case at this time. Will collaborate with Care Guide to outreach to schedule follow up with RN CM. Patient has clinic contact information for future questions or concerns.  ? ?Catie Feliz Beam, PharmD, [place], CPP ?Clinical Pharmacist ?Nature conservation officer at [employer] ?[phone] ? ?

## 2021-03-16 DIAGNOSIS — Z7952 Long term (current) use of systemic steroids: Secondary | ICD-10-CM

## 2021-03-16 DIAGNOSIS — E7800 Pure hypercholesterolemia, unspecified: Secondary | ICD-10-CM | POA: Diagnosis not present

## 2021-03-16 DIAGNOSIS — E559 Vitamin D deficiency, unspecified: Secondary | ICD-10-CM | POA: Diagnosis not present

## 2021-03-16 DIAGNOSIS — E039 Hypothyroidism, unspecified: Secondary | ICD-10-CM | POA: Diagnosis not present

## 2021-03-16 DIAGNOSIS — Z6837 Body mass index (BMI) 37.0-37.9, adult: Secondary | ICD-10-CM | POA: Diagnosis not present

## 2021-03-16 DIAGNOSIS — Z86010 Personal history of colonic polyps: Secondary | ICD-10-CM

## 2021-03-16 DIAGNOSIS — Z9181 History of falling: Secondary | ICD-10-CM

## 2021-03-16 DIAGNOSIS — K5700 Diverticulitis of small intestine with perforation and abscess without bleeding: Secondary | ICD-10-CM | POA: Diagnosis not present

## 2021-03-16 DIAGNOSIS — M353 Polymyalgia rheumatica: Secondary | ICD-10-CM | POA: Diagnosis not present

## 2021-03-16 DIAGNOSIS — K219 Gastro-esophageal reflux disease without esophagitis: Secondary | ICD-10-CM | POA: Diagnosis not present

## 2021-03-16 DIAGNOSIS — D649 Anemia, unspecified: Secondary | ICD-10-CM | POA: Diagnosis not present

## 2021-03-22 DIAGNOSIS — Z7952 Long term (current) use of systemic steroids: Secondary | ICD-10-CM | POA: Diagnosis not present

## 2021-03-22 DIAGNOSIS — M353 Polymyalgia rheumatica: Secondary | ICD-10-CM | POA: Diagnosis not present

## 2021-03-25 ENCOUNTER — Telehealth: Payer: Self-pay

## 2021-03-25 NOTE — Chronic Care Management (AMB) (Signed)
?  Chronic Care Management  ? ?Note ? ?03/25/2021 ?Name: Dynette Maginnis Cornick MRN: [id number] DOB: February 07, 1940 ? ?Annarosa Lagow Schatz is a 81 y.o. year old female who is a primary care patient of Einar Pheasant, MD. Johnita Beals Hemminger is currently enrolled in care management services. An additional referral for RNCM was placed.  ? ?Follow up plan: ?Patient declines further follow up and engagement by the care management team. Appropriate care team members and provider have been notified via electronic communication.  ? ?Noreene Larsson, RMA ?Care Guide, Embedded Care Coordination ?[facility]  ?[place], [place] [address] ?Direct Dial: [phone] ?[email or web address] ?Website: [email or web address]  ? ?

## 2021-03-28 ENCOUNTER — Telehealth: Payer: Medicare PPO

## 2021-04-06 ENCOUNTER — Telehealth: Payer: Medicare PPO

## 2021-05-16 ENCOUNTER — Other Ambulatory Visit (INDEPENDENT_AMBULATORY_CARE_PROVIDER_SITE_OTHER): Payer: Medicare PPO

## 2021-05-16 DIAGNOSIS — E7800 Pure hypercholesterolemia, unspecified: Secondary | ICD-10-CM | POA: Diagnosis not present

## 2021-05-16 DIAGNOSIS — M353 Polymyalgia rheumatica: Secondary | ICD-10-CM | POA: Diagnosis not present

## 2021-05-16 DIAGNOSIS — E039 Hypothyroidism, unspecified: Secondary | ICD-10-CM

## 2021-05-16 LAB — CBC WITH DIFFERENTIAL/PLATELET
Basophils Absolute: 0.1 10*3/uL (ref 0.0–0.1)
Basophils Relative: 1 % (ref 0.0–3.0)
Eosinophils Absolute: 0.1 10*3/uL (ref 0.0–0.7)
Eosinophils Relative: 1.3 % (ref 0.0–5.0)
HCT: 40.1 % (ref 36.0–46.0)
Hemoglobin: 12.8 g/dL (ref 12.0–15.0)
Lymphocytes Relative: 37.4 % (ref 12.0–46.0)
Lymphs Abs: 2.5 10*3/uL (ref 0.7–4.0)
MCHC: 31.9 g/dL (ref 30.0–36.0)
MCV: 88.5 fl (ref 78.0–100.0)
Monocytes Absolute: 0.5 10*3/uL (ref 0.1–1.0)
Monocytes Relative: 8 % (ref 3.0–12.0)
Neutro Abs: 3.5 10*3/uL (ref 1.4–7.7)
Neutrophils Relative %: 52.3 % (ref 43.0–77.0)
Platelets: 229 10*3/uL (ref 150.0–400.0)
RBC: 4.53 Mil/uL (ref 3.87–5.11)
RDW: 15.4 % (ref 11.5–15.5)
WBC: 6.6 10*3/uL (ref 4.0–10.5)

## 2021-05-16 LAB — HEPATIC FUNCTION PANEL
ALT: 10 U/L (ref 0–35)
AST: 16 U/L (ref 0–37)
Albumin: 3.5 g/dL (ref 3.5–5.2)
Alkaline Phosphatase: 46 U/L (ref 39–117)
Bilirubin, Direct: 0.1 mg/dL (ref 0.0–0.3)
Total Bilirubin: 0.5 mg/dL (ref 0.2–1.2)
Total Protein: 6.2 g/dL (ref 6.0–8.3)

## 2021-05-16 LAB — BASIC METABOLIC PANEL
BUN: 20 mg/dL (ref 6–23)
CO2: 32 mEq/L (ref 19–32)
Calcium: 8.6 mg/dL (ref 8.4–10.5)
Chloride: 102 mEq/L (ref 96–112)
Creatinine, Ser: 0.84 mg/dL (ref 0.40–1.20)
GFR: 65.6 mL/min (ref >60.00)
Glucose, Bld: 78 mg/dL (ref 70–99)
Potassium: 3.7 mEq/L (ref 3.5–5.1)
Sodium: 140 mEq/L (ref 135–145)

## 2021-05-16 LAB — LIPID PANEL
Cholesterol: 200 mg/dL (ref 0–200)
HDL: 62.3 mg/dL (ref >39.00)
LDL Cholesterol: 123 mg/dL — ABNORMAL HIGH (ref 0–99)
NonHDL: 137.48
Total CHOL/HDL Ratio: 3
Triglycerides: 73 mg/dL (ref 0.0–149.0)
VLDL: 14.6 mg/dL (ref 0.0–40.0)

## 2021-05-17 LAB — TSH: TSH: 1.65 u[IU]/mL (ref 0.35–5.50)

## 2021-05-23 ENCOUNTER — Ambulatory Visit: Payer: Medicare PPO | Admitting: Internal Medicine

## 2021-05-23 ENCOUNTER — Encounter: Payer: Self-pay | Admitting: Internal Medicine

## 2021-05-23 DIAGNOSIS — E7800 Pure hypercholesterolemia, unspecified: Secondary | ICD-10-CM

## 2021-05-23 DIAGNOSIS — E039 Hypothyroidism, unspecified: Secondary | ICD-10-CM

## 2021-05-23 DIAGNOSIS — K219 Gastro-esophageal reflux disease without esophagitis: Secondary | ICD-10-CM | POA: Diagnosis not present

## 2021-05-23 DIAGNOSIS — M353 Polymyalgia rheumatica: Secondary | ICD-10-CM

## 2021-05-23 DIAGNOSIS — Z86010 Personal history of colonic polyps: Secondary | ICD-10-CM | POA: Diagnosis not present

## 2021-05-23 DIAGNOSIS — F439 Reaction to severe stress, unspecified: Secondary | ICD-10-CM

## 2021-05-23 DIAGNOSIS — Z0100 Encounter for examination of eyes and vision without abnormal findings: Secondary | ICD-10-CM | POA: Diagnosis not present

## 2021-05-23 NOTE — Progress Notes (Signed)
Patient ID: Jodi Harris, female   DOB: November 05, 1940, 81 y.o.   MRN: [id number]   Subjective:    Patient ID: Jodi Harris, female    DOB: 03-26-1940, 81 y.o.   MRN: [id number]   Patient here for a scheduled follow up.   Chief Complaint  Patient presents with   Follow-up   Hyperlipidemia   Gastroesophageal Reflux   .   HPI Overall doing relatively well.  On prednisone - PMR.  Followed by rheumatology.  Tapering gradually.  No chest pain reported.  Breathing stable.  On nexium.  Appears to be controlling upper GI symptoms.  On fosamax.  No abdominal pain.  Bowels stable.  Pre review - taking ozempic.  Off crestor.    Past Medical History:  Diagnosis Date   Anemia    Diverticulosis    GERD (gastroesophageal reflux disease)    History of colonic polyps    Hypercholesterolemia    Hypothyroidism    Internal hemorrhoids    Thrombophlebitis    varicosities   Past Surgical History:  Procedure Laterality Date   COLON SURGERY     laparoscopic assisted transverse colectomy for tubular adenomatous polyp   COLONOSCOPY     COLONOSCOPY WITH PROPOFOL N/A 07/29/2018   Procedure: COLONOSCOPY WITH PROPOFOL;  Surgeon: Lollie Sails, MD;  Location: [facility] ENDOSCOPY;  Service: Endoscopy;  Laterality: N/A;   DIAGNOSTIC LAPAROSCOPY     LAPAROSCOPIC GASTRIC BANDING     URETHRAL DIVERTICULUM REPAIR  1974   Dr Redmond Baseman   Family History  Problem Relation Age of Onset   Cancer Father        lymphoma   Cancer Maternal Aunt        Breast   Breast cancer Maternal Aunt 86   Colon cancer Other        one relative on maternal side   Social History   Socioeconomic History   Marital status: Married    Spouse name: Not on file   Number of children: 2   Years of education: Not on file   Highest education level: Not on file  Occupational History   Not on file  Tobacco Use   Smoking status: Never   Smokeless tobacco: Never   Tobacco comments:    smoked as a teenager  Vaping Use    Vaping Use: Never used  Substance and Sexual Activity   Alcohol use: Not Currently    Alcohol/week: 0.0 standard drinks    Comment: has a glass of wine about once a month   Drug use: No   Sexual activity: Never  Other Topics Concern   Not on file  Social History Narrative   Not on file   Social Determinants of Health   Financial Resource Strain: Low Risk    Difficulty of Paying Living Expenses: Not hard at all  Food Insecurity: No Food Insecurity   Worried About Charity fundraiser in the Last Year: Never true   [place] in the Last Year: Never true  Transportation Needs: No Transportation Needs   Lack of Transportation (Medical): No   Lack of Transportation (Non-Medical): No  Physical Activity: Not on file  Stress: Not on file  Social Connections: Not on file     Review of Systems  Constitutional:  Negative for appetite change and unexpected weight change.  HENT:  Negative for congestion and sinus pressure.   Respiratory:  Negative for cough, chest tightness and shortness of breath.   Cardiovascular:  Negative for chest pain, palpitations and leg swelling.  Gastrointestinal:  Negative for abdominal pain, diarrhea, nausea and vomiting.  Genitourinary:  Negative for difficulty urinating and dysuria.  Musculoskeletal:  Negative for joint swelling and myalgias.  Skin:  Negative for color change and rash.  Neurological:  Negative for dizziness, light-headedness and headaches.  Psychiatric/Behavioral:  Negative for agitation and dysphoric mood.       Objective:     BP 138/80 (BP Location: Left Arm, Patient Position: Sitting, Cuff Size: Large)   Pulse 70   Temp 98.3 F (36.8 C) (Temporal)   Resp 15   Ht 5\' 4"  (1.626 m)   Wt 240 lb 9.6 oz (109.1 kg)   LMP 02/01/1993   SpO2 96%   BMI 41.30 kg/m  Wt Readings from Last 3 Encounters:  05/23/21 240 lb 9.6 oz (109.1 kg)  02/23/21 233 lb (105.7 kg)  02/21/21 233 lb 9.6 oz (106 kg)    Physical Exam Vitals  reviewed.  Constitutional:      General: She is not in acute distress.    Appearance: Normal appearance.  HENT:     Head: Normocephalic and atraumatic.     Right Ear: External ear normal.     Left Ear: External ear normal.  Eyes:     General: No scleral icterus.       Right eye: No discharge.        Left eye: No discharge.     Conjunctiva/sclera: Conjunctivae normal.  Neck:     Thyroid: No thyromegaly.  Cardiovascular:     Rate and Rhythm: Normal rate and regular rhythm.  Pulmonary:     Effort: No respiratory distress.     Breath sounds: Normal breath sounds. No wheezing.  Abdominal:     General: Bowel sounds are normal.     Palpations: Abdomen is soft.     Tenderness: There is no abdominal tenderness.  Musculoskeletal:        General: No swelling or tenderness.     Cervical back: Neck supple. No tenderness.  Lymphadenopathy:     Cervical: No cervical adenopathy.  Skin:    Findings: No erythema or rash.  Neurological:     Mental Status: She is alert.  Psychiatric:        Mood and Affect: Mood normal.        Behavior: Behavior normal.     Outpatient Encounter Medications as of 05/23/2021  Medication Sig   Cholecalciferol (VITAMIN D-3 PO) Take 2,000 Units by mouth daily.   esomeprazole (NEXIUM) 40 MG capsule Take 1 capsule (40 mg total) by mouth daily.   fluticasone (FLONASE) 50 MCG/ACT nasal spray Place 2 sprays into both nostrils daily as needed.   Semaglutide,0.25 or 0.5MG /DOS, (OZEMPIC, 0.25 OR 0.5 MG/DOSE,) 2 MG/1.5ML SOPN Inject into the skin.   [DISCONTINUED] levothyroxine (SYNTHROID) 125 MCG tablet Take 1 tablet (125 mcg total) by mouth daily before breakfast.   levothyroxine (SYNTHROID) 125 MCG tablet Take 1 tablet (125 mcg total) by mouth daily before breakfast.   No facility-administered encounter medications on file as of 05/23/2021.     Lab Results  Component Value Date   WBC 6.6 05/16/2021   HGB 12.8 05/16/2021   HCT 40.1 05/16/2021   PLT 229.0  05/16/2021   GLUCOSE 78 05/16/2021   CHOL 200 05/16/2021   TRIG 73.0 05/16/2021   HDL 62.30 05/16/2021   LDLDIRECT 129.8 02/15/2012   LDLCALC 123 (H) 05/16/2021   ALT 10 05/16/2021   AST  16 05/16/2021   NA 140 05/16/2021   K 3.7 05/16/2021   CL 102 05/16/2021   CREATININE 0.84 05/16/2021   BUN 20 05/16/2021   CO2 32 05/16/2021   TSH 1.65 05/16/2021    DG Chest 2 View  Result Date: 02/02/2021 CLINICAL DATA:  Syncope EXAM: CHEST - 2 VIEW COMPARISON:  05/05/2014 FINDINGS: Linear scar in the right infrahilar region. No confluent airspace opacities. No effusions. Heart is normal size. Scattered aortic calcifications. IMPRESSION: No active cardiopulmonary disease. Electronically Signed   By: Rolm Baptise M.D.   On: 02/02/2021 19:31   CT HEAD WO CONTRAST  Result Date: 02/02/2021 CLINICAL DATA:  Syncope, trauma EXAM: CT HEAD WITHOUT CONTRAST TECHNIQUE: Contiguous axial images were obtained from the base of the skull through the vertex without intravenous contrast. RADIATION DOSE REDUCTION: This exam was performed according to the departmental dose-optimization program which includes automated exposure control, adjustment of the mA and/or kV according to patient size and/or use of iterative reconstruction technique. COMPARISON:  None. FINDINGS: Brain: No acute intracranial findings are seen. Cortical sulci are prominent. There is decreased density in the subcortical and periventricular white matter. Vascular: There are scattered arterial calcifications. Skull: Unremarkable. Sinuses/Orbits: Unremarkable. Other: There is increased amount of CSF insula suggesting partial empty sella. IMPRESSION: No acute intracranial findings are seen in noncontrast CT brain. Atrophy. Small-vessel disease. Electronically Signed   By: Elmer Picker M.D.   On: 02/02/2021 18:16   CT Cervical Spine Wo Contrast  Result Date: 02/02/2021 CLINICAL DATA:  Fall, neck trauma. EXAM: CT CERVICAL SPINE WITHOUT CONTRAST  TECHNIQUE: Multidetector CT imaging of the cervical spine was performed without intravenous contrast. Multiplanar CT image reconstructions were also generated. RADIATION DOSE REDUCTION: This exam was performed according to the departmental dose-optimization program which includes automated exposure control, adjustment of the mA and/or kV according to patient size and/or use of iterative reconstruction technique. COMPARISON:  None. FINDINGS: Alignment: Mild anterolisthesis C2-3 and C3-4 Skull base and vertebrae: Negative for fracture or mass Soft tissues and spinal canal: Negative for soft tissue mass or edema. Atherosclerosis. Disc levels: Disc and facet degeneration throughout the cervical spine. Foraminal narrowing bilaterally C5-6 and C6-7 due to spurring. Upper chest: Lung apices clear bilaterally Other: None IMPRESSION: Cervical spondylosis.  Negative for fracture. Electronically Signed   By: Franchot Gallo M.D.   On: 02/02/2021 18:21       Assessment & Plan:   Problem List Items Addressed This Visit     Complete eye exam, encounter for    Discussed eye evaluation.  (right eye).  Refer to ophthalmology (request Dr George Ina).        Relevant Orders   Ambulatory referral to Ophthalmology   GERD (gastroesophageal reflux disease)    On nexium.  Upper symptoms controlled.        History of colon polyps    Had colonoscopy 07/2018 - redundant colon and diverticulosis.  F/u recommended in 5 years.         Hypercholesterolemia    Off crestor currently.  Was held due to muscle aching and joint stiffness. Diagnosed with PMR.  On prednisone.  Low cholesterol diet and exercise.  Follow lipid panel.  Consider restarting.        Relevant Orders   Lipid panel   Hepatic function panel   Hypothyroidism    On thyroid replacement.  Follow tsh.        Relevant Medications   levothyroxine (SYNTHROID) 125 MCG tablet   Other Relevant  Orders   TSH   PMR (polymyalgia rheumatica) (HCC)    On  prednisone.  Being followed by rheumatology. Tapering gradually.        Relevant Orders   Basic metabolic panel   Severe obesity (BMI >= 40) (HCC)    Is s/p lap band surgery.  On ozempic.  Weight is down.  Discussed diet and exercise.  Follow.         Relevant Medications   Semaglutide,0.25 or 0.5MG /DOS, (OZEMPIC, 0.25 OR 0.5 MG/DOSE,) 2 MG/1.5ML SOPN   Stress    On prozac. Appears to be doing well.  Follow.           Einar Pheasant, MD

## 2021-05-27 ENCOUNTER — Ambulatory Visit: Payer: Medicare PPO | Admitting: Internal Medicine

## 2021-05-30 DIAGNOSIS — M353 Polymyalgia rheumatica: Secondary | ICD-10-CM | POA: Diagnosis not present

## 2021-05-30 DIAGNOSIS — Z7952 Long term (current) use of systemic steroids: Secondary | ICD-10-CM | POA: Diagnosis not present

## 2021-06-03 ENCOUNTER — Encounter: Payer: Self-pay | Admitting: Internal Medicine

## 2021-06-03 DIAGNOSIS — Z0100 Encounter for examination of eyes and vision without abnormal findings: Secondary | ICD-10-CM | POA: Insufficient documentation

## 2021-06-03 MED ORDER — LEVOTHYROXINE SODIUM 125 MCG PO TABS
125.0000 ug | ORAL_TABLET | Freq: Every day | ORAL | 1 refills | Status: DC
Start: 2021-06-03 — End: 2021-08-19

## 2021-06-03 NOTE — Assessment & Plan Note (Signed)
Had colonoscopy 07/2018 - redundant colon and diverticulosis.  F/u recommended in 5 years.   

## 2021-06-03 NOTE — Assessment & Plan Note (Signed)
Is s/p lap band surgery.  On ozempic.  Weight is down.  Discussed diet and exercise.  Follow.   

## 2021-06-03 NOTE — Assessment & Plan Note (Signed)
On nexium.  Upper symptoms controlled.  

## 2021-06-03 NOTE — Assessment & Plan Note (Signed)
Discussed eye evaluation.  (right eye).  Refer to ophthalmology (request Dr Druscilla Brownie).

## 2021-06-03 NOTE — Assessment & Plan Note (Signed)
On thyroid replacement.  Follow tsh.  

## 2021-06-03 NOTE — Assessment & Plan Note (Signed)
On prednisone.  Being followed by rheumatology. Tapering gradually.  

## 2021-06-03 NOTE — Assessment & Plan Note (Signed)
Off crestor currently.  Was held due to muscle aching and joint stiffness. Diagnosed with PMR.  On prednisone.  Low cholesterol diet and exercise.  Follow lipid panel.  Consider restarting.  

## 2021-06-03 NOTE — Assessment & Plan Note (Signed)
On prozac.  Appears to be doing well.  Follow.  

## 2021-07-21 DIAGNOSIS — H43813 Vitreous degeneration, bilateral: Secondary | ICD-10-CM | POA: Diagnosis not present

## 2021-08-16 DIAGNOSIS — M85859 Other specified disorders of bone density and structure, unspecified thigh: Secondary | ICD-10-CM | POA: Diagnosis not present

## 2021-08-16 DIAGNOSIS — Z7952 Long term (current) use of systemic steroids: Secondary | ICD-10-CM | POA: Diagnosis not present

## 2021-08-16 DIAGNOSIS — M353 Polymyalgia rheumatica: Secondary | ICD-10-CM | POA: Diagnosis not present

## 2021-08-18 ENCOUNTER — Other Ambulatory Visit (INDEPENDENT_AMBULATORY_CARE_PROVIDER_SITE_OTHER): Payer: Medicare PPO

## 2021-08-18 DIAGNOSIS — E039 Hypothyroidism, unspecified: Secondary | ICD-10-CM

## 2021-08-18 DIAGNOSIS — M353 Polymyalgia rheumatica: Secondary | ICD-10-CM | POA: Diagnosis not present

## 2021-08-18 DIAGNOSIS — E7800 Pure hypercholesterolemia, unspecified: Secondary | ICD-10-CM | POA: Diagnosis not present

## 2021-08-18 LAB — LIPID PANEL
Cholesterol: 200 mg/dL (ref 0–200)
HDL: 56.8 mg/dL (ref >39.00)
LDL Cholesterol: 127 mg/dL — ABNORMAL HIGH (ref 0–99)
NonHDL: 142.76
Total CHOL/HDL Ratio: 4
Triglycerides: 78 mg/dL (ref 0.0–149.0)
VLDL: 15.6 mg/dL (ref 0.0–40.0)

## 2021-08-18 LAB — BASIC METABOLIC PANEL
BUN: 17 mg/dL (ref 6–23)
CO2: 32 mEq/L (ref 19–32)
Calcium: 8.8 mg/dL (ref 8.4–10.5)
Chloride: 104 mEq/L (ref 96–112)
Creatinine, Ser: 0.85 mg/dL (ref 0.40–1.20)
GFR: 64.55 mL/min (ref >60.00)
Glucose, Bld: 80 mg/dL (ref 70–99)
Potassium: 3.8 mEq/L (ref 3.5–5.1)
Sodium: 142 mEq/L (ref 135–145)

## 2021-08-18 LAB — HEPATIC FUNCTION PANEL
ALT: 8 U/L (ref 0–35)
AST: 14 U/L (ref 0–37)
Albumin: 3.6 g/dL (ref 3.5–5.2)
Alkaline Phosphatase: 51 U/L (ref 39–117)
Bilirubin, Direct: 0.1 mg/dL (ref 0.0–0.3)
Total Bilirubin: 0.7 mg/dL (ref 0.2–1.2)
Total Protein: 6 g/dL (ref 6.0–8.3)

## 2021-08-18 LAB — TSH: TSH: 0.32 u[IU]/mL — ABNORMAL LOW (ref 0.35–5.50)

## 2021-08-19 ENCOUNTER — Other Ambulatory Visit: Payer: Self-pay

## 2021-08-19 DIAGNOSIS — E039 Hypothyroidism, unspecified: Secondary | ICD-10-CM

## 2021-08-19 MED ORDER — LEVOTHYROXINE SODIUM 112 MCG PO TABS: 112.0000 ug | ORAL_TABLET | Freq: Every day | ORAL | 3 refills | Status: DC

## 2021-08-23 ENCOUNTER — Ambulatory Visit: Payer: Medicare PPO | Admitting: Internal Medicine

## 2021-09-08 ENCOUNTER — Telehealth: Payer: Self-pay | Admitting: Internal Medicine

## 2021-09-08 NOTE — Telephone Encounter (Signed)
Patient would like to speak to Dr Lorin Picket. She would like to speak to her about fainting yesterday. The last time it happened was in January.

## 2021-09-09 NOTE — Telephone Encounter (Signed)
Just FYI patient was advised of message. She will not take her weekly dose of Ozempic & she will not drive either. She does not have a way of checking blood sugars at home. She said that she has labs done due to being on prednisone every 3 months with rheumatology & all was fine. She will be seen by ED if she has any more acute issues before Tuesday.

## 2021-09-09 NOTE — Telephone Encounter (Signed)
FYI I called and talked with patient to triage & to see if willing to be see by UC or our [place] office today. Pt stated that Wednesday morning she was starting to feel hot & sweaty so she sat on her stool in kitchen. She said that her hearing kind of faded and next thing she knew she woke up on the floor. She did not hit her head this time as she said that she did when she fainted in January & her back was just sore. She laid there awhile then got up. She said that she was a little sleepy yesterday but today was back to herself & felt good. She did not feel that she needed ASAP eval & stated that she had wore a Holter monitor in the past, but nothing was revealed as an issue. Pt is wondering if this is due to low blood sugar although she is unsure? She said that she feels that the Ozempic may be the cause & she is just taking for weight loss. She was willing to be seen Tuesday when we were back in the office on Tuesday, so she is scheduled with Dr. French Ana at 2:40p.

## 2021-09-09 NOTE — Telephone Encounter (Signed)
Hold ozempic. Does she have a way to check her blood sugar?  Recommend evaluation before , but if declines, will need to be evaluated if any acute issues.  Recommend not to drive until can sort through issues.

## 2021-09-09 NOTE — Telephone Encounter (Signed)
Pt returning call

## 2021-09-09 NOTE — Telephone Encounter (Signed)
LMTCB

## 2021-09-13 ENCOUNTER — Encounter: Payer: Self-pay | Admitting: Internal Medicine

## 2021-09-13 ENCOUNTER — Ambulatory Visit: Payer: Medicare PPO | Admitting: Internal Medicine

## 2021-09-13 VITALS — Temp 98.1°F | Ht 64.0 in | Wt 241.0 lb

## 2021-09-13 DIAGNOSIS — R55 Syncope and collapse: Secondary | ICD-10-CM

## 2021-09-13 DIAGNOSIS — Z6841 Body Mass Index (BMI) 40.0 and over, adult: Secondary | ICD-10-CM | POA: Insufficient documentation

## 2021-09-13 DIAGNOSIS — W19XXXA Unspecified fall, initial encounter: Secondary | ICD-10-CM

## 2021-09-13 NOTE — Progress Notes (Signed)
Chief Complaint  Patient presents with   Dizziness    Pt fainted on last wednesday   F/u  1. Syncope with loc 01/2021 ct head  and also 09/06/21 not witness since then feels brain in fog and did not go to the ED for evaluation. Before the episodes she feels strange and she was sitting down both times. This happens between around 9-9:30 am She does not have bowel/bladder incontinence and normally had been to the bathroom and had nausea before syncopal episode  She has had 2 falls She has handicap sticker for her husband and denies walker or cane and does not want this denies PT/OT at home as well  2. Bmi > 41 and pt on between ozempic 0.25 to 0.5 dose never 0.5 weekly and wants to disc with PCP about getting off of this medication and alternatives    Review of Systems  Constitutional:  Negative for weight loss.  HENT:  Negative for hearing loss.   Eyes:  Negative for blurred vision.  Respiratory:  Negative for shortness of breath.   Cardiovascular:  Negative for chest pain.  Gastrointestinal:  Negative for abdominal pain and blood in stool.  Genitourinary:  Negative for dysuria.  Musculoskeletal:  Positive for falls. Negative for joint pain.  Skin:  Negative for rash.  Neurological:  Positive for loss of consciousness. Negative for headaches.  Psychiatric/Behavioral:  Negative for depression.    Past Medical History:  Diagnosis Date   Anemia    Diverticulosis    GERD (gastroesophageal reflux disease)    History of colonic polyps    Hypercholesterolemia    Hypothyroidism    Internal hemorrhoids    Thrombophlebitis    varicosities   Past Surgical History:  Procedure Laterality Date   COLON SURGERY     laparoscopic assisted transverse colectomy for tubular adenomatous polyp   COLONOSCOPY     COLONOSCOPY WITH PROPOFOL N/A 07/29/2018   Procedure: COLONOSCOPY WITH PROPOFOL;  Surgeon: Christena Deem, MD;  Location: [facility] ENDOSCOPY;  Service: Endoscopy;  Laterality: N/A;    DIAGNOSTIC LAPAROSCOPY     LAPAROSCOPIC GASTRIC BANDING     URETHRAL DIVERTICULUM REPAIR  1974   Dr Jenne Pane   Family History  Problem Relation Age of Onset   Cancer Father        lymphoma   Cancer Maternal Aunt        Breast   Breast cancer Maternal Aunt 32   Colon cancer Other        one relative on maternal side   Social History   Socioeconomic History   Marital status: Married    Spouse name: Not on file   Number of children: 2   Years of education: Not on file   Highest education level: Not on file  Occupational History   Not on file  Tobacco Use   Smoking status: Never   Smokeless tobacco: Never   Tobacco comments:    smoked as a teenager  Vaping Use   Vaping Use: Never used  Substance and Sexual Activity   Alcohol use: Not Currently    Alcohol/week: 0.0 standard drinks of alcohol    Comment: has a glass of wine about once a month   Drug use: No   Sexual activity: Never  Other Topics Concern   Not on file  Social History Narrative   Not on file   Social Determinants of Health   Financial Resource Strain: Low Risk  (08/26/2020)   Overall Financial Resource  Strain (CARDIA)    Difficulty of Paying Living Expenses: Not hard at all  Food Insecurity: No Food Insecurity (02/23/2021)   Hunger Vital Sign    Worried About Running Out of Food in the Last Year: Never true    Ran Out of Food in the Last Year: Never true  Transportation Needs: No Transportation Needs (02/23/2021)   PRAPARE - Administrator, Civil Service (Medical): No    Lack of Transportation (Non-Medical): No  Physical Activity: Insufficiently Active (03/02/2020)   Exercise Vital Sign    Days of Exercise per Week: 1 day    Minutes of Exercise per Session: 30 min  Stress: No Stress Concern Present (02/23/2020)   [employer] of Occupational Health - Occupational Stress Questionnaire    Feeling of Stress : Not at all  Social Connections: Unknown (02/23/2020)   Social Connection and  Isolation Panel [NHANES]    Frequency of Communication with Friends and Family: More than three times a week    Frequency of Social Gatherings with Friends and Family: More than three times a week    Attends Religious Services: Not on file    Active Member of Clubs or Organizations: Not on file    Attends Banker Meetings: Not on file    Marital Status: Married  Intimate Partner Violence: Not At Risk (02/23/2021)   Humiliation, Afraid, Rape, and Kick questionnaire    Fear of Current or Ex-Partner: No    Emotionally Abused: No    Physically Abused: No    Sexually Abused: No   Current Meds  Medication Sig   Cholecalciferol (VITAMIN D-3 PO) Take 2,000 Units by mouth daily.   esomeprazole (NEXIUM) 40 MG capsule Take 1 capsule (40 mg total) by mouth daily.   fluticasone (FLONASE) 50 MCG/ACT nasal spray Place 2 sprays into both nostrils daily as needed.   levothyroxine (SYNTHROID) 112 MCG tablet Take 1 tablet (112 mcg total) by mouth daily.   Semaglutide,0.25 or 0.5MG /DOS, (OZEMPIC, 0.25 OR 0.5 MG/DOSE,) 2 MG/1.5ML SOPN Inject into the skin.   Allergies  Allergen Reactions   Codeine Sulfate    Penicillins    Recent Results (from the past 2160 hour(s))  Basic metabolic panel     Status: None   Collection Time: 08/18/21  9:23 AM  Result Value Ref Range   Sodium 142 135 - 145 mEq/L   Potassium 3.8 3.5 - 5.1 mEq/L   Chloride 104 96 - 112 mEq/L   CO2 32 19 - 32 mEq/L   Glucose, Bld 80 70 - 99 mg/dL   BUN 17 6 - 23 mg/dL   Creatinine, Ser 8.54 0.40 - 1.20 mg/dL   GFR 62.70 >35.00 mL/min    Comment: Calculated using the CKD-EPI Creatinine Equation (2021)   Calcium 8.8 8.4 - 10.5 mg/dL  Hepatic function panel     Status: None   Collection Time: 08/18/21  9:23 AM  Result Value Ref Range   Total Bilirubin 0.7 0.2 - 1.2 mg/dL   Bilirubin, Direct 0.1 0.0 - 0.3 mg/dL   Alkaline Phosphatase 51 39 - 117 U/L   AST 14 0 - 37 U/L   ALT 8 0 - 35 U/L   Total Protein 6.0 6.0 - 8.3  g/dL   Albumin 3.6 3.5 - 5.2 g/dL  Lipid panel     Status: Abnormal   Collection Time: 08/18/21  9:23 AM  Result Value Ref Range   Cholesterol 200 0 - 200 mg/dL  Comment: ATP III Classification       Desirable:  < 200 mg/dL               Borderline High:  200 - 239 mg/dL          High:  > = 008 mg/dL   Triglycerides 67.6 0.0 - 149.0 mg/dL    Comment: Normal:  <195 mg/dLBorderline High:  150 - 199 mg/dL   HDL 09.32 >67.12 mg/dL   VLDL 45.8 0.0 - 09.9 mg/dL   LDL Cholesterol 833 (H) 0 - 99 mg/dL   Total CHOL/HDL Ratio 4     Comment:                Men          Women1/2 Average Risk     3.4          3.3Average Risk          5.0          4.42X Average Risk          9.6          7.13X Average Risk          15.0          11.0                       NonHDL 142.76     Comment: NOTE:  Non-HDL goal should be 30 mg/dL higher than patient's LDL goal (i.e. LDL goal of < 70 mg/dL, would have non-HDL goal of < 100 mg/dL)  TSH     Status: Abnormal   Collection Time: 08/18/21  9:23 AM  Result Value Ref Range   TSH 0.32 (L) 0.35 - 5.50 uIU/mL   Objective  Body mass index is 41.37 kg/m. Wt Readings from Last 3 Encounters:  09/13/21 241 lb (109.3 kg)  05/23/21 240 lb 9.6 oz (109.1 kg)  02/23/21 233 lb (105.7 kg)   Temp Readings from Last 3 Encounters:  09/13/21 98.1 F (36.7 C) (Oral)  05/23/21 98.3 F (36.8 C) (Temporal)  02/21/21 97.8 F (36.6 C)   BP Readings from Last 3 Encounters:  05/23/21 138/80  02/21/21 132/74  02/02/21 118/65   Pulse Readings from Last 3 Encounters:  05/23/21 70  02/21/21 82  02/02/21 74    Physical Exam Vitals and nursing note reviewed.  Constitutional:      Appearance: Normal appearance. She is well-developed and well-groomed.  HENT:     Head: Normocephalic and atraumatic.  Eyes:     Conjunctiva/sclera: Conjunctivae normal.     Pupils: Pupils are equal, round, and reactive to light.  Cardiovascular:     Rate and Rhythm: Normal rate and regular  rhythm.     Heart sounds: Normal heart sounds. No murmur heard. Pulmonary:     Effort: Pulmonary effort is normal.     Breath sounds: Normal breath sounds.  Abdominal:     General: Abdomen is flat. Bowel sounds are normal.     Tenderness: There is no abdominal tenderness.  Musculoskeletal:        General: No tenderness.  Skin:    General: Skin is warm and dry.  Neurological:     General: No focal deficit present.     Mental Status: She is alert and oriented to person, place, and time. Mental status is at baseline.     Cranial Nerves: Cranial nerves 2-12 are intact.     Motor: Motor  function is intact.     Coordination: Coordination is intact.     Gait: Gait is intact.  Psychiatric:        Attention and Perception: Attention and perception normal.        Mood and Affect: Mood and affect normal.        Speech: Speech normal.        Behavior: Behavior normal. Behavior is cooperative.        Thought Content: Thought content normal.        Cognition and Memory: Cognition and memory normal.        Judgment: Judgment normal.     Assessment  Plan  Syncope with loc and falls ? Post concussion syndrome, ?vasovagal needs cardiac and neurology w/u orthostatics negative today lying 119/73 hr 74, sitting 119/74 hr 71 standing 118/67/hr 79- Plan: US Carotid Duplex Bilateral, MR Brain Wo Contrast, Ambulatory referral to Cardiology, Ambulatory referral to Neurology,  Declines walker/cane Had handicap sticker  Declines home PT/OT   BMI 40.0-44.9, adult (HCC)  Wants to disc options other than ozempic with PCP  Provider: Dr. French Ana McLean-Scocuzza-[facility] Medicine

## 2021-09-13 NOTE — Patient Instructions (Addendum)
Dr. Manuella Ghazi neurology   Phone Fax E-mail Address  [phone] Not available [place]   [facility]   [place] [address]     Specialties     Neurology      cardiology [facility]   [place], [place] [address]   [place], [place], [place]   [facility]   [place], [place] [address]   [phone]   [phone] (Fax)       Stay hydrated with water 55-64 ounces daily  Referral    Concussion, Adult  A concussion is a brain injury from a hard, direct hit (trauma) to the head or body. This direct hit causes the brain to shake quickly back and forth inside the skull. This can damage brain cells and cause chemical changes in the brain. A concussion may also be known as a mild traumatic brain injury (TBI). Concussions are usually not life-threatening, but the effects of a concussion can be serious. If you have a concussion, you should be very careful to avoid having a second concussion. What are the causes? This condition is caused by: A direct hit to your head, such as: Running into another player during a game. Being hit in a fight. Hitting your head on a hard surface. Sudden movement of your body that causes your brain to move back and forth inside the skull, such as in a car crash. What are the signs or symptoms? The signs of a concussion can be hard to notice. Early on, they may be missed by you, family members, and health care providers. You may look fine on the outside but may act or feel differently. Every head injury is different. Symptoms are usually temporary but may last for days, weeks, or even months. Some symptoms appear right away, but other symptoms may not show up for hours or days. If your symptoms last longer than normal, you may have post-concussion syndrome. Physical symptoms Headaches. Dizziness and problems with coordination or  balance. Sensitivity to light or noise. Nausea or vomiting. Tiredness (fatigue). Vision or hearing problems. Changes in eating or sleeping patterns. Seizure. Mental and emotional symptoms Irritability or mood changes. Memory problems. Trouble concentrating, organizing, or making decisions. Slowness in thinking, acting or reacting, speaking, or reading. Anxiety or depression. How is this diagnosed? This condition is diagnosed based on: Your symptoms. A description of your injury. You may also have tests, including: Imaging tests, such as a CT scan or an MRI. Neuropsychological tests. These measure your thinking, understanding, learning, and remembering abilities. How is this treated? Treatment for this condition includes: Stopping sports or activity if you are injured. If you hit your head or show signs of concussion: Do not return to sports or activities the same day. Get checked by a health care provider before you return to your activities. Physical and mental rest and careful observation, usually at home. Gradually return to your normal activities. Medicines to help with symptoms such as headaches, nausea, or difficulty sleeping. Avoid taking opioid pain medicine while recovering from a concussion. Avoiding alcohol and drugs. These may slow your recovery and can put you at risk of further injury. Referral to a concussion clinic or rehabilitation center. Recovery from a concussion can take time. How fast you recover depends on many factors. Return to activities only when: Your symptoms are completely gone. Your health care provider says that it is safe. Follow these instructions  at home: Activity Limit activities that require a lot of thought or concentration, such as: Doing homework or job-related work. Watching TV. Working on the computer or phone. Playing memory games and puzzles. Rest. Rest helps your brain heal. Make sure you: Get plenty of sleep. Most adults should  get 7-9 hours of sleep each night. Rest during the day. Take naps or rest breaks when you feel tired. Avoid physical activity like exercise until your health care provider says it is safe. Stop any activity that worsens symptoms. Do not do high-risk activities that could cause a second concussion, such as riding a bike or playing sports. Ask your health care provider when you can return to your normal activities, such as school, work, athletics, and driving. Your ability to react may be slower after a brain injury. Never do these activities if you are dizzy. Your health care provider will likely give you a plan for gradually returning to activities. General instructions  Take over-the-counter and prescription medicines only as told by your health care provider. Some medicines, such as blood thinners (anticoagulants) and aspirin, may increase the risk for complications, such as bleeding. Do not drink alcohol until your health care provider says you can. Watch your symptoms and tell others around you to do the same. Complications sometimes occur after a concussion. Older adults with a brain injury may have a higher risk of serious complications. Tell your work Production designer, theatre/television/film, teachers, Tax adviser, school counselor, coach, or Event organiser about your injury, symptoms, and restrictions. Keep all follow-up visits as told by your health care provider. This is important. How is this prevented? Avoiding another brain injury is very important. In rare cases, another injury can lead to permanent brain damage, brain swelling, or death. The risk of this is greatest during the first 7-10 days after a head injury. Avoid injuries by: Stopping activities that could lead to a second concussion, such as contact or recreational sports, until your health care provider says it is okay. Taking these actions once you have returned to sports or activities: Avoiding plays or moves that can cause you to crash into another  person. This is how most concussions occur. Following the rules and being respectful of other players. Do not engage in violent or illegal plays. Getting regular exercise that includes strength and balance training. Wearing a properly fitting helmet during sports, biking, or other activities. Helmets can help protect you from serious skull and brain injuries, but they may not protect you from a concussion. Even when wearing a helmet, you should avoid being hit in the head. Contact a health care provider if: Your symptoms do not improve. You have new symptoms. You have another injury. Get help right away if: You have new or worsening physical symptoms, such as: A severe or worsening headache. Weakness or numbness in any part of your body, slurred speech, vision changes, or confusion. Your coordination gets worse. Vomiting repeatedly. You have a seizure. You have unusual behavior changes. You lose consciousness, are sleepier than normal, or are difficult to wake up. These symptoms may represent a serious problem that is an emergency. Do not wait to see if the symptoms will go away. Get medical help right away. Call your local emergency services (911 in the [place] Do not drive yourself to the hospital. Summary A concussion is a brain injury that results from a hard, direct hit (trauma) to your head or body. You may have imaging tests and neuropsychological tests to diagnose a concussion. Treatment  for this condition includes physical and mental rest and careful observation. Ask your health care provider when you can return to your normal activities, such as school, work, athletics, and driving. Get help right away if you have a severe headache, weakness in any part of the body, seizures, behavior changes, changes in vision, or if you are confused or sleepier than normal. This information is not intended to replace advice given to you by your health care provider. Make sure you discuss any  questions you have with your health care provider. Document Revised: 03/11/2020 Document Reviewed: 03/11/2020 Elsevier Patient Education  2023 [employer]  Syncope, Adult  Syncope refers to a condition in which a person temporarily loses consciousness. Syncope may also be called fainting or passing out. It is caused by a sudden decrease in blood flow to the brain. This can happen for a variety of reasons. Most causes of syncope are not dangerous. It can be triggered by things such as needle sticks, seeing blood, pain, or intense emotion. However, syncope can also be a sign of a serious medical problem, such as a heart abnormality. Other causes can include dehydration, migraines, or taking medicines that lower blood pressure. Your health care provider may do tests to find the reason why you are having syncope. If you faint, get medical help right away. Call your local emergency services (911 in the [place] Follow these instructions at home: Pay attention to any changes in your symptoms. Take these actions to stay safe and to help relieve your symptoms: Knowing when you may be about to faint Signs that you may be about to faint include: Feeling dizzy, weak, light-headed, or like the room is spinning. Feeling nauseous. Seeing spots or seeing all white or all black in your field of vision. Having cold, clammy skin or feeling warm and sweaty. Hearing ringing in the ears (tinnitus). If you start to feel like you might faint, sit or lie down right away. If sitting, put your head down between your legs. If lying down, raise (elevate) your feet above the level of your heart. Breathe deeply and steadily. Wait until all the symptoms have passed. Have someone stay with you until you feel stable. Medicines Take over-the-counter and prescription medicines only as told by your health care provider. If you are taking blood pressure or heart medicine, get up slowly and take several minutes to sit and then  stand. This can reduce dizziness and decrease the risk of syncope. Lifestyle Do not drive, use machinery, or play sports until your health care provider says it is okay. Do not drink alcohol. Do not use any products that contain nicotine or tobacco. These products include cigarettes, chewing tobacco, and vaping devices, such as e-cigarettes. If you need help quitting, ask your health care provider. Avoid hot tubs and saunas. General instructions Talk with your health care provider about your symptoms. You may need to have testing to understand the cause of your syncope. Drink enough fluid to keep your urine pale yellow. Avoid prolonged standing. If you must stand for a long time, do movements such as: Moving your legs. Crossing your legs. Flexing and stretching your leg muscles. Squatting. Keep all follow-up visits. This is important. Contact a health care provider if: You have episodes of near fainting. Get help right away if: You faint. You hit your head or are injured after fainting. You have any of these symptoms that may indicate trouble with your heart: Fast or irregular heartbeats (palpitations). Unusual pain in  your chest, abdomen, or back. Shortness of breath. You have a seizure. You have a severe headache. You are confused. You have vision problems. You have severe weakness or trouble walking. You are bleeding from your mouth or rectum, or you have black or tarry stool. These symptoms may represent a serious problem that is an emergency. Do not wait to see if your symptoms will go away. Get medical help right away. Call your local emergency services (911 in the [place] Do not drive yourself to the hospital. Summary Syncope refers to a condition in which a person temporarily loses consciousness. Syncope may also be called fainting or passing out. It is caused by a sudden decrease in blood flow to the brain. Signs that you may be about to faint include dizziness, feeling  light-headed, feeling nauseous, sudden vision changes, or cold, clammy skin. Even though most causes of syncope are not dangerous, syncope can be a sign of a serious medical problem. Get help right away if you faint. If you start to feel like you might faint, sit or lie down right away. If sitting, put your head down between your legs. If lying down, raise (elevate) your feet above the level of your heart. This information is not intended to replace advice given to you by your health care provider. Make sure you discuss any questions you have with your health care provider. Document Revised: 05/06/2020 Document Reviewed: 05/06/2020 Elsevier Patient Education  2023 [employer].

## 2021-09-14 ENCOUNTER — Ambulatory Visit
Admission: RE | Admit: 2021-09-14 | Discharge: 2021-09-14 | Disposition: A | Discharge status: Home / Self Care | Payer: Medicare PPO | Source: Ambulatory Visit | Attending: Internal Medicine | Admitting: Internal Medicine

## 2021-09-14 DIAGNOSIS — X58XXXA Exposure to other specified factors, initial encounter: Secondary | ICD-10-CM | POA: Diagnosis not present

## 2021-09-14 DIAGNOSIS — W19XXXA Unspecified fall, initial encounter: Secondary | ICD-10-CM | POA: Diagnosis not present

## 2021-09-14 DIAGNOSIS — I6782 Cerebral ischemia: Secondary | ICD-10-CM | POA: Insufficient documentation

## 2021-09-14 DIAGNOSIS — R55 Syncope and collapse: Secondary | ICD-10-CM | POA: Insufficient documentation

## 2021-09-14 NOTE — Progress Notes (Signed)
Reviewed.  Saw Dr French Ana.  W/up in progress.  Have her stop ozempic.

## 2021-09-22 ENCOUNTER — Ambulatory Visit
Admission: RE | Admit: 2021-09-22 | Discharge: 2021-09-22 | Disposition: A | Discharge status: Home / Self Care | Payer: Medicare PPO | Source: Ambulatory Visit | Attending: Internal Medicine | Admitting: Internal Medicine

## 2021-09-22 DIAGNOSIS — I2510 Atherosclerotic heart disease of native coronary artery without angina pectoris: Secondary | ICD-10-CM | POA: Diagnosis not present

## 2021-09-22 DIAGNOSIS — R55 Syncope and collapse: Secondary | ICD-10-CM | POA: Diagnosis not present

## 2021-09-22 DIAGNOSIS — W19XXXA Unspecified fall, initial encounter: Secondary | ICD-10-CM

## 2021-09-22 DIAGNOSIS — I6523 Occlusion and stenosis of bilateral carotid arteries: Secondary | ICD-10-CM | POA: Diagnosis not present

## 2021-09-23 ENCOUNTER — Encounter: Payer: Self-pay | Admitting: Internal Medicine

## 2021-09-23 NOTE — Telephone Encounter (Signed)
[facility] has a program - Lifestyles program.  I can place a referral if desires.  Also, [place] has weight loss program in [place]

## 2021-09-26 ENCOUNTER — Telehealth: Payer: Self-pay

## 2021-09-26 NOTE — Telephone Encounter (Signed)
Patient returned office call. Note was no questions at this time.

## 2021-09-26 NOTE — Telephone Encounter (Signed)
Lvm for pt to return call in regards to [place] results.  Per Dr.Tracy: No significant plaque build up on carotid ultrasound

## 2021-09-27 DIAGNOSIS — M353 Polymyalgia rheumatica: Secondary | ICD-10-CM | POA: Diagnosis not present

## 2021-09-27 DIAGNOSIS — E039 Hypothyroidism, unspecified: Secondary | ICD-10-CM | POA: Diagnosis not present

## 2021-09-27 DIAGNOSIS — G939 Disorder of brain, unspecified: Secondary | ICD-10-CM | POA: Diagnosis not present

## 2021-09-27 DIAGNOSIS — R55 Syncope and collapse: Secondary | ICD-10-CM | POA: Diagnosis not present

## 2021-09-28 ENCOUNTER — Encounter: Payer: Self-pay | Admitting: Internal Medicine

## 2021-09-29 NOTE — Telephone Encounter (Signed)
I can do a virtual visit with Jodi Harris (different chart then the one this message is in).  Ok to schedule virtual visit tomorrow at 1:00.  Regarding Ms Jodi Harris - trying to avoid close contact, washing hands.

## 2021-09-29 NOTE — Telephone Encounter (Signed)
Scheduled [facility] appt for 1pm tomorrow . See note in [place] chart?

## 2021-09-30 ENCOUNTER — Telehealth: Payer: Self-pay

## 2021-09-30 DIAGNOSIS — R55 Syncope and collapse: Secondary | ICD-10-CM

## 2021-09-30 NOTE — Progress Notes (Signed)
Patient aware. She has stopped using Ozempic since her visit with Dr Olivia Mackie

## 2021-09-30 NOTE — Telephone Encounter (Signed)
Order placed for labs to be added to her f/u tsh.

## 2021-09-30 NOTE — Telephone Encounter (Signed)
Patient states that Dr Manuella Ghazi wanted for patient to have additional labs ordered for her up coming lab appointment. Per his notes below is the recommended lab work requested.  Recommend adding the following labs to your next blood work: Magnesium, phosphorus, Vit B12, Vitamin B6, (These labs are done to rule out systemic causes of the seizure-like episode)

## 2021-10-04 DIAGNOSIS — R0602 Shortness of breath: Secondary | ICD-10-CM | POA: Diagnosis not present

## 2021-10-04 DIAGNOSIS — E7800 Pure hypercholesterolemia, unspecified: Secondary | ICD-10-CM | POA: Diagnosis not present

## 2021-10-04 DIAGNOSIS — R55 Syncope and collapse: Secondary | ICD-10-CM | POA: Diagnosis not present

## 2021-10-04 DIAGNOSIS — Z6841 Body Mass Index (BMI) 40.0 and over, adult: Secondary | ICD-10-CM | POA: Diagnosis not present

## 2021-10-10 ENCOUNTER — Other Ambulatory Visit (INDEPENDENT_AMBULATORY_CARE_PROVIDER_SITE_OTHER): Payer: Medicare PPO

## 2021-10-10 DIAGNOSIS — E039 Hypothyroidism, unspecified: Secondary | ICD-10-CM | POA: Diagnosis not present

## 2021-10-10 DIAGNOSIS — R55 Syncope and collapse: Secondary | ICD-10-CM

## 2021-10-10 LAB — TSH: TSH: 1.61 u[IU]/mL (ref 0.35–5.50)

## 2021-10-13 ENCOUNTER — Other Ambulatory Visit: Payer: Self-pay | Admitting: Internal Medicine

## 2021-10-13 DIAGNOSIS — Z1231 Encounter for screening mammogram for malignant neoplasm of breast: Secondary | ICD-10-CM

## 2021-10-14 ENCOUNTER — Encounter: Payer: Medicare PPO | Admitting: Internal Medicine

## 2021-10-17 ENCOUNTER — Ambulatory Visit (INDEPENDENT_AMBULATORY_CARE_PROVIDER_SITE_OTHER): Payer: Medicare PPO | Admitting: Internal Medicine

## 2021-10-17 ENCOUNTER — Encounter: Payer: Self-pay | Admitting: Internal Medicine

## 2021-10-17 VITALS — BP 124/66 | HR 75 | Temp 97.8°F | Ht 64.0 in | Wt 247.4 lb

## 2021-10-17 DIAGNOSIS — Z86010 Personal history of colonic polyps: Secondary | ICD-10-CM | POA: Diagnosis not present

## 2021-10-17 DIAGNOSIS — R55 Syncope and collapse: Secondary | ICD-10-CM

## 2021-10-17 DIAGNOSIS — M353 Polymyalgia rheumatica: Secondary | ICD-10-CM | POA: Diagnosis not present

## 2021-10-17 DIAGNOSIS — E039 Hypothyroidism, unspecified: Secondary | ICD-10-CM | POA: Diagnosis not present

## 2021-10-17 DIAGNOSIS — F439 Reaction to severe stress, unspecified: Secondary | ICD-10-CM

## 2021-10-17 DIAGNOSIS — R739 Hyperglycemia, unspecified: Secondary | ICD-10-CM

## 2021-10-17 DIAGNOSIS — K219 Gastro-esophageal reflux disease without esophagitis: Secondary | ICD-10-CM | POA: Diagnosis not present

## 2021-10-17 DIAGNOSIS — E7800 Pure hypercholesterolemia, unspecified: Secondary | ICD-10-CM

## 2021-10-17 DIAGNOSIS — Z0000 Encounter for general adult medical examination without abnormal findings: Secondary | ICD-10-CM | POA: Diagnosis not present

## 2021-10-17 LAB — MAGNESIUM: Magnesium: 1.9 mg/dL (ref 1.5–2.5)

## 2021-10-17 LAB — VITAMIN B12: Vitamin B-12: 189 pg/mL — ABNORMAL LOW (ref 211–911)

## 2021-10-17 LAB — PHOSPHORUS: Phosphorus: 3.4 mg/dL (ref 2.3–4.6)

## 2021-10-17 NOTE — Assessment & Plan Note (Addendum)
Physical today 10/17/21.  Mammogram 11/11/20 - birads I.  Scheduled for f/u mammogram 11/15/2021. Colonoscopy 07/2018 - recommended f/u in 5 years.

## 2021-10-17 NOTE — Progress Notes (Signed)
Patient ID: Salimah Martinovich Legendre, female   DOB: October 22, 1940, 81 y.o.   MRN: [id number]   Subjective:    Patient ID: Janalee Dane Scaife, female    DOB: 14-Oct-1940, 81 y.o.   MRN: [id number]   Patient here for  Chief Complaint  Patient presents with   Follow-up    Physical   .   HPI Was evaluated 10/04/21 - for syncope.  Elected to monitor.  Saw neurology.  Scheduled for EEG 11/2021.  MRI brain 09/2021 - No acute intracranial pathology. 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  Bilateral Carotid Duplex US 09/22/2021: Color duplex indicates minimal homogeneous plaque, with no hemodynamically significant stenosis by duplex criteria in the extracranial cerebrovascular circulation. No further episodes.  No chest pain.  Breathing stable.  No increased cough or congestion.  No abdominal pain or bowel change reported.     Past Medical History:  Diagnosis Date   Anemia    Diverticulosis    GERD (gastroesophageal reflux disease)    History of colonic polyps    Hypercholesterolemia    Hypothyroidism    Internal hemorrhoids    Thrombophlebitis    varicosities   Past Surgical History:  Procedure Laterality Date   COLON SURGERY     laparoscopic assisted transverse colectomy for tubular adenomatous polyp   COLONOSCOPY     COLONOSCOPY WITH PROPOFOL N/A 07/29/2018   Procedure: COLONOSCOPY WITH PROPOFOL;  Surgeon: Lollie Sails, MD;  Location: [facility] ENDOSCOPY;  Service: Endoscopy;  Laterality: N/A;   DIAGNOSTIC LAPAROSCOPY     LAPAROSCOPIC GASTRIC BANDING     URETHRAL DIVERTICULUM REPAIR  1974   Dr Redmond Baseman   Family History  Problem Relation Age of Onset   Cancer Father        lymphoma   Cancer Maternal Aunt        Breast   Breast cancer Maternal Aunt 60   Colon cancer Other        one relative on maternal side   Social History   Socioeconomic History   Marital status: Married    Spouse name: Not on file   Number of children: 2   Years of education: Not on file    Highest education level: Not on file  Occupational History   Not on file  Tobacco Use   Smoking status: Never   Smokeless tobacco: Never   Tobacco comments:    smoked as a teenager  Vaping Use   Vaping Use: Never used  Substance and Sexual Activity   Alcohol use: Not Currently    Alcohol/week: 0.0 standard drinks of alcohol    Comment: has a glass of wine about once a month   Drug use: No   Sexual activity: Never  Other Topics Concern   Not on file  Social History Narrative   Not on file   Social Determinants of Health   Financial Resource Strain: Low Risk  (08/26/2020)   Overall Financial Resource Strain (CARDIA)    Difficulty of Paying Living Expenses: Not hard at all  Food Insecurity: No Food Insecurity (02/23/2021)   Hunger Vital Sign    Worried About Running Out of Food in the Last Year: Never true    Ran Out of Food in the Last Year: Never true  Transportation Needs: No Transportation Needs (02/23/2021)   PRAPARE - Hydrologist (Medical): No    Lack of Transportation (Non-Medical): No  Physical Activity: Insufficiently Active (03/02/2020)   Exercise Vital Sign  Days of Exercise per Week: 1 day    Minutes of Exercise per Session: 30 min  Stress: No Stress Concern Present (02/23/2020)   [place]    Feeling of Stress : Not at all  Social Connections: Unknown (02/23/2020)   Social Connection and Isolation Panel [NHANES]    Frequency of Communication with Friends and Family: More than three times a week    Frequency of Social Gatherings with Friends and Family: More than three times a week    Attends Religious Services: Not on Advertising copywriter or Organizations: Not on file    Attends Archivist Meetings: Not on file    Marital Status: Married     Review of Systems  Constitutional:  Negative for appetite change and unexpected weight change.  HENT:   Negative for congestion, sinus pressure and sore throat.   Eyes:  Negative for pain and visual disturbance.  Respiratory:  Negative for cough, chest tightness and shortness of breath.   Cardiovascular:  Negative for chest pain and palpitations.       No increased leg swelling.   Gastrointestinal:  Negative for abdominal pain, diarrhea, nausea and vomiting.  Genitourinary:  Negative for difficulty urinating and dysuria.  Musculoskeletal:  Negative for joint swelling and myalgias.  Skin:  Negative for color change and rash.  Neurological:  Negative for dizziness, light-headedness and headaches.  Hematological:  Negative for adenopathy. Does not bruise/bleed easily.  Psychiatric/Behavioral:  Negative for agitation and dysphoric mood.        Objective:     BP 124/66 (BP Location: Left Arm, Patient Position: Sitting, Cuff Size: Normal)   Pulse 75   Temp 97.8 F (36.6 C) (Oral)   Ht _0  (1.626 m)   Wt 247 lb 6.4 oz (112.2 kg)   LMP 02/01/1993   SpO2 93%   BMI 42.47 kg/m  Wt Readings from Last 3 Encounters:  10/17/21 247 lb 6.4 oz (112.2 kg)  09/13/21 241 lb (109.3 kg)  05/23/21 240 lb 9.6 oz (109.1 kg)    Physical Exam Vitals reviewed.  Constitutional:      General: She is not in acute distress.    Appearance: Normal appearance. She is well-developed.  HENT:     Head: Normocephalic and atraumatic.     Right Ear: External ear normal.     Left Ear: External ear normal.  Eyes:     General: No scleral icterus.       Right eye: No discharge.        Left eye: No discharge.     Conjunctiva/sclera: Conjunctivae normal.  Neck:     Thyroid: No thyromegaly.  Cardiovascular:     Rate and Rhythm: Normal rate and regular rhythm.  Pulmonary:     Effort: No tachypnea, accessory muscle usage or respiratory distress.     Breath sounds: Normal breath sounds. No decreased breath sounds or wheezing.  Chest:  Breasts:    Right: No inverted nipple, mass, nipple discharge or tenderness  (no axillary adenopathy).     Left: No inverted nipple, mass, nipple discharge or tenderness (no axilarry adenopathy).  Abdominal:     General: Bowel sounds are normal.     Palpations: Abdomen is soft.     Tenderness: There is no abdominal tenderness.  Musculoskeletal:        General: No swelling or tenderness.     Cervical back: Neck supple.  Lymphadenopathy:  Cervical: No cervical adenopathy.  Skin:    Findings: No erythema or rash.  Neurological:     Mental Status: She is alert and oriented to person, place, and time.  Psychiatric:        Mood and Affect: Mood normal.        Behavior: Behavior normal.      Outpatient Encounter Medications as of 10/17/2021  Medication Sig   Cholecalciferol (VITAMIN D-3 PO) Take 2,000 Units by mouth daily.   fluticasone (FLONASE) 50 MCG/ACT nasal spray Place 2 sprays into both nostrils daily as needed.   levothyroxine (SYNTHROID) 112 MCG tablet Take 1 tablet (112 mcg total) by mouth daily.   predniSONE (DELTASONE) 1 MG tablet Take 4 mg by mouth daily with breakfast.   [DISCONTINUED] esomeprazole (NEXIUM) 40 MG capsule Take 1 capsule (40 mg total) by mouth daily.   [DISCONTINUED] Semaglutide,0.25 or 0.5MG/DOS, (OZEMPIC, 0.25 OR 0.5 MG/DOSE,) 2 MG/1.5ML SOPN Inject into the skin.   No facility-administered encounter medications on file as of 10/17/2021.     Lab Results  Component Value Date   WBC 6.6 05/16/2021   HGB 12.8 05/16/2021   HCT 40.1 05/16/2021   PLT 229.0 05/16/2021   GLUCOSE 80 08/18/2021   CHOL 200 08/18/2021   TRIG 78.0 08/18/2021   HDL 56.80 08/18/2021   LDLDIRECT 129.8 02/15/2012   LDLCALC 127 (H) 08/18/2021   ALT 8 08/18/2021   AST 14 08/18/2021   NA 142 08/18/2021   K 3.8 08/18/2021   CL 104 08/18/2021   CREATININE 0.85 08/18/2021   BUN 17 08/18/2021   CO2 32 08/18/2021   TSH 1.61 10/10/2021    US Carotid Duplex Bilateral  Result Date: 09/23/2021 CLINICAL DATA:  81 year old female with syncope EXAM:  BILATERAL CAROTID DUPLEX ULTRASOUND TECHNIQUE: Pearline Cables scale imaging, color Doppler and duplex ultrasound were performed of bilateral carotid and vertebral arteries in the neck. COMPARISON:  None Available. FINDINGS: Criteria: Quantification of carotid stenosis is based on velocity parameters that correlate the residual internal carotid diameter with NASCET-based stenosis levels, using the diameter of the distal internal carotid lumen as the denominator for stenosis measurement. The following velocity measurements were obtained: RIGHT ICA:  Systolic 70 cm/sec, Diastolic 19 cm/sec CCA:  66 cm/sec SYSTOLIC ICA/CCA RATIO:  1.1 ECA:  64 cm/sec LEFT ICA:  Systolic 83 cm/sec, Diastolic 20 cm/sec CCA:  68 cm/sec SYSTOLIC ICA/CCA RATIO:  1.2 ECA:  51 cm/sec Right Brachial SBP: Not acquired Left Brachial SBP: Not acquired RIGHT CAROTID ARTERY: No significant calcified disease of the right common carotid artery. Intermediate waveform maintained. Homogeneous plaque without significant calcifications at the right carotid bifurcation. Low resistance waveform of the right ICA. No significant tortuosity. RIGHT VERTEBRAL ARTERY: Antegrade flow with low resistance waveform. LEFT CAROTID ARTERY: No significant calcified disease of the left common carotid artery. Intermediate waveform maintained. Homogeneous plaque at the left carotid bifurcation without significant calcifications. Low resistance waveform of the left ICA. LEFT VERTEBRAL ARTERY:  Antegrade flow with low resistance waveform. IMPRESSION: Color duplex indicates minimal homogeneous plaque, with no hemodynamically significant stenosis by duplex criteria in the extracranial cerebrovascular circulation. Signed, Dulcy Fanny. Nadene Rubins, RPVI [facility] Electronically Signed   By: Corrie Mckusick D.O.   On: 09/23/2021 09:22       Assessment & Plan:   Problem List Items Addressed This Visit     GERD  (gastroesophageal reflux disease)    On nexium.  Upper symptoms controlled.  Health care maintenance    Physical today 10/17/21.  Mammogram 11/11/20 - birads I.  Scheduled for f/u mammogram 11/15/2021. Colonoscopy 07/2018 - recommended f/u in 5 years.        History of colon polyps    Had colonoscopy 07/2018 - redundant colon and diverticulosis.  F/u recommended in 5 years.        Hypercholesterolemia    Off crestor currently.  Was held due to muscle aching and joint stiffness. Diagnosed with PMR.  On prednisone.  Low cholesterol diet and exercise.  Follow lipid panel.  Consider restarting.       Relevant Orders   Hepatic function panel   Lipid panel   Basic metabolic panel   Hyperglycemia    Low carb diet and exercise.  Check met b and a1c.       Relevant Orders   Hemoglobin A1c   Hypothyroidism    On thyroid replacement.  Follow tsh.       Relevant Orders   TSH   PMR (polymyalgia rheumatica) (HCC)    On prednisone.  Being followed by rheumatology. Tapering gradually.       Severe obesity (BMI >= 40) (HCC)    Is s/p lap band surgery.  Has been on ozempic. Discussed diet and exercise.  Follow.        Stress    On prozac. Appears to be doing well.  Follow.        Syncope    Saw neurology and cardiology.  MRI and carotid ultrasound as outlined.  Planning for EEG 11/2021.  Cardiology elected to follow.  Follow.        Other Visit Diagnoses     Routine general medical examination at a health care facility    -  Primary        Einar Pheasant, MD

## 2021-10-20 ENCOUNTER — Ambulatory Visit (INDEPENDENT_AMBULATORY_CARE_PROVIDER_SITE_OTHER): Payer: Medicare PPO

## 2021-10-20 DIAGNOSIS — E538 Deficiency of other specified B group vitamins: Secondary | ICD-10-CM | POA: Diagnosis not present

## 2021-10-20 MED ORDER — CYANOCOBALAMIN 1000 MCG/ML IJ SOLN
1000.0000 ug | Freq: Once | INTRAMUSCULAR | Status: AC
  Administered 2021-10-20: 1000 ug via INTRAMUSCULAR

## 2021-10-20 NOTE — Progress Notes (Addendum)
Patient presented for B 12 injection to left deltoid, patient voiced no concerns nor showed any signs of distress during injection. Montrice Gracey,cma  

## 2021-10-21 LAB — VITAMIN B6: Vitamin B6: 2.6 ng/mL (ref 2.1–21.7)

## 2021-10-22 ENCOUNTER — Encounter: Payer: Self-pay | Admitting: Internal Medicine

## 2021-10-22 DIAGNOSIS — R739 Hyperglycemia, unspecified: Secondary | ICD-10-CM | POA: Insufficient documentation

## 2021-10-22 NOTE — Assessment & Plan Note (Signed)
Had colonoscopy 07/2018 - redundant colon and diverticulosis.  F/u recommended in 5 years.   

## 2021-10-22 NOTE — Assessment & Plan Note (Signed)
Off crestor currently.  Was held due to muscle aching and joint stiffness. Diagnosed with PMR.  On prednisone.  Low cholesterol diet and exercise.  Follow lipid panel.  Consider restarting.

## 2021-10-22 NOTE — Assessment & Plan Note (Signed)
On thyroid replacement.  Follow tsh.  

## 2021-10-22 NOTE — Assessment & Plan Note (Signed)
Is s/p lap band surgery.  Has been on ozempic. Discussed diet and exercise.  Follow.

## 2021-10-22 NOTE — Assessment & Plan Note (Signed)
On prednisone.  Being followed by rheumatology. Tapering gradually.

## 2021-10-22 NOTE — Assessment & Plan Note (Signed)
On nexium.  Upper symptoms controlled.  

## 2021-10-22 NOTE — Assessment & Plan Note (Signed)
On prozac.  Appears to be doing well.  Follow.  

## 2021-10-22 NOTE — Assessment & Plan Note (Signed)
Saw neurology and cardiology.  MRI and carotid ultrasound as outlined.  Planning for EEG 11/2021.  Cardiology elected to follow.  Follow.

## 2021-10-22 NOTE — Assessment & Plan Note (Signed)
Low carb diet and exercise.  Check met b and a1c.

## 2021-10-27 ENCOUNTER — Ambulatory Visit (INDEPENDENT_AMBULATORY_CARE_PROVIDER_SITE_OTHER): Payer: Medicare PPO

## 2021-10-27 DIAGNOSIS — E538 Deficiency of other specified B group vitamins: Secondary | ICD-10-CM

## 2021-10-27 MED ORDER — CYANOCOBALAMIN 1000 MCG/ML IJ SOLN
1000.0000 ug | Freq: Once | INTRAMUSCULAR | Status: AC
  Administered 2021-10-27: 1000 ug via INTRAMUSCULAR

## 2021-10-27 NOTE — Progress Notes (Signed)
Jodi Harris presents today for injection per MD orders. B12 injection administered IM in left Upper Arm. Administration without incident. Patient tolerated well.

## 2021-11-02 ENCOUNTER — Ambulatory Visit: Payer: Medicare PPO

## 2021-11-04 ENCOUNTER — Ambulatory Visit: Payer: Medicare PPO

## 2021-11-09 ENCOUNTER — Ambulatory Visit (INDEPENDENT_AMBULATORY_CARE_PROVIDER_SITE_OTHER): Payer: Medicare PPO

## 2021-11-09 DIAGNOSIS — M9902 Segmental and somatic dysfunction of thoracic region: Secondary | ICD-10-CM | POA: Diagnosis not present

## 2021-11-09 DIAGNOSIS — M62830 Muscle spasm of back: Secondary | ICD-10-CM | POA: Diagnosis not present

## 2021-11-09 DIAGNOSIS — M5033 Other cervical disc degeneration, cervicothoracic region: Secondary | ICD-10-CM | POA: Diagnosis not present

## 2021-11-09 DIAGNOSIS — M9901 Segmental and somatic dysfunction of cervical region: Secondary | ICD-10-CM | POA: Diagnosis not present

## 2021-11-09 DIAGNOSIS — E538 Deficiency of other specified B group vitamins: Secondary | ICD-10-CM

## 2021-11-09 MED ORDER — CYANOCOBALAMIN 1000 MCG/ML IJ SOLN
1000.0000 ug | Freq: Once | INTRAMUSCULAR | Status: AC
  Administered 2021-11-09: 1000 ug via INTRAMUSCULAR

## 2021-11-09 NOTE — Progress Notes (Signed)
Pt arrived for B12 injection, given in L deltoid. Pt tolerated injection well, showed no signs of distress nor voiced any concerns.  ?

## 2021-11-14 DIAGNOSIS — M62830 Muscle spasm of back: Secondary | ICD-10-CM | POA: Diagnosis not present

## 2021-11-14 DIAGNOSIS — M5033 Other cervical disc degeneration, cervicothoracic region: Secondary | ICD-10-CM | POA: Diagnosis not present

## 2021-11-14 DIAGNOSIS — M9901 Segmental and somatic dysfunction of cervical region: Secondary | ICD-10-CM | POA: Diagnosis not present

## 2021-11-14 DIAGNOSIS — M9902 Segmental and somatic dysfunction of thoracic region: Secondary | ICD-10-CM | POA: Diagnosis not present

## 2021-11-16 ENCOUNTER — Telehealth: Payer: Self-pay | Admitting: *Deleted

## 2021-11-16 ENCOUNTER — Ambulatory Visit (INDEPENDENT_AMBULATORY_CARE_PROVIDER_SITE_OTHER): Payer: Medicare PPO | Admitting: *Deleted

## 2021-11-16 DIAGNOSIS — E538 Deficiency of other specified B group vitamins: Secondary | ICD-10-CM | POA: Diagnosis not present

## 2021-11-16 MED ORDER — CYANOCOBALAMIN 1000 MCG/ML IJ SOLN
1000.0000 ug | Freq: Once | INTRAMUSCULAR | Status: AC
  Administered 2021-11-16: 1000 ug via INTRAMUSCULAR

## 2021-11-16 NOTE — Progress Notes (Signed)
Pt  received B12 injection in left deltoid. Pt tolerated it well with no concerns or complaints. 

## 2021-11-17 DIAGNOSIS — M353 Polymyalgia rheumatica: Secondary | ICD-10-CM | POA: Diagnosis not present

## 2021-11-17 DIAGNOSIS — Z7952 Long term (current) use of systemic steroids: Secondary | ICD-10-CM | POA: Diagnosis not present

## 2021-11-21 DIAGNOSIS — M5033 Other cervical disc degeneration, cervicothoracic region: Secondary | ICD-10-CM | POA: Diagnosis not present

## 2021-11-21 DIAGNOSIS — M9901 Segmental and somatic dysfunction of cervical region: Secondary | ICD-10-CM | POA: Diagnosis not present

## 2021-11-21 DIAGNOSIS — M62830 Muscle spasm of back: Secondary | ICD-10-CM | POA: Diagnosis not present

## 2021-11-21 DIAGNOSIS — M9902 Segmental and somatic dysfunction of thoracic region: Secondary | ICD-10-CM | POA: Diagnosis not present

## 2021-11-25 DIAGNOSIS — M9902 Segmental and somatic dysfunction of thoracic region: Secondary | ICD-10-CM | POA: Diagnosis not present

## 2021-11-25 DIAGNOSIS — M9901 Segmental and somatic dysfunction of cervical region: Secondary | ICD-10-CM | POA: Diagnosis not present

## 2021-11-25 DIAGNOSIS — M62830 Muscle spasm of back: Secondary | ICD-10-CM | POA: Diagnosis not present

## 2021-11-25 DIAGNOSIS — M5033 Other cervical disc degeneration, cervicothoracic region: Secondary | ICD-10-CM | POA: Diagnosis not present

## 2021-12-05 DIAGNOSIS — M9902 Segmental and somatic dysfunction of thoracic region: Secondary | ICD-10-CM | POA: Diagnosis not present

## 2021-12-05 DIAGNOSIS — M62830 Muscle spasm of back: Secondary | ICD-10-CM | POA: Diagnosis not present

## 2021-12-05 DIAGNOSIS — M9901 Segmental and somatic dysfunction of cervical region: Secondary | ICD-10-CM | POA: Diagnosis not present

## 2021-12-05 DIAGNOSIS — M5033 Other cervical disc degeneration, cervicothoracic region: Secondary | ICD-10-CM | POA: Diagnosis not present

## 2021-12-20 ENCOUNTER — Ambulatory Visit (INDEPENDENT_AMBULATORY_CARE_PROVIDER_SITE_OTHER): Payer: Medicare PPO

## 2021-12-20 DIAGNOSIS — E538 Deficiency of other specified B group vitamins: Secondary | ICD-10-CM | POA: Diagnosis not present

## 2021-12-20 MED ORDER — CYANOCOBALAMIN 1000 MCG/ML IJ SOLN
1000.0000 ug | Freq: Once | INTRAMUSCULAR | Status: AC
  Administered 2021-12-20: 1000 ug via INTRAMUSCULAR

## 2021-12-20 NOTE — Progress Notes (Signed)
Pt presented for their vitamin B12 injection. Pt was identified through two identifiers. Pt tolerated shot well in their right deltoid.  

## 2021-12-26 DIAGNOSIS — M9902 Segmental and somatic dysfunction of thoracic region: Secondary | ICD-10-CM | POA: Diagnosis not present

## 2021-12-26 DIAGNOSIS — M9901 Segmental and somatic dysfunction of cervical region: Secondary | ICD-10-CM | POA: Diagnosis not present

## 2021-12-26 DIAGNOSIS — M62830 Muscle spasm of back: Secondary | ICD-10-CM | POA: Diagnosis not present

## 2021-12-26 DIAGNOSIS — M5033 Other cervical disc degeneration, cervicothoracic region: Secondary | ICD-10-CM | POA: Diagnosis not present

## 2021-12-30 ENCOUNTER — Encounter: Payer: Self-pay | Admitting: Internal Medicine

## 2021-12-30 NOTE — Telephone Encounter (Signed)
Pt advised Has not tested yet  Will test and call with result by 1pm

## 2021-12-30 NOTE — Telephone Encounter (Signed)
To prescribe antiviral medication for covid - need to have a positive covid test.  Please confirm if she has tested and if has test results.

## 2021-12-30 NOTE — Telephone Encounter (Signed)
I tested. It is negative, thank goodness, but I still feel something is developing. If I worsen and test positive, is someone there this weekend to call in the relief medicine so that i can recover faster? Thanks,  Jodi Harris

## 2022-01-14 ENCOUNTER — Encounter: Payer: Self-pay | Admitting: Internal Medicine

## 2022-01-16 NOTE — Telephone Encounter (Signed)
S/w pt - sx resolved except for congestion and sinus pressure. States sometimes gets a headache from sinus pressure and feels like her brain is foggy from the pressure.  Pt denies sob, chest pain, fever, body aches. Neg COVID test.  Pt set up w/ virtual Dr Maudie Mercury tomorrow.

## 2022-01-17 ENCOUNTER — Encounter: Payer: Self-pay | Admitting: Family Medicine

## 2022-01-17 ENCOUNTER — Telehealth (INDEPENDENT_AMBULATORY_CARE_PROVIDER_SITE_OTHER): Payer: Medicare PPO | Admitting: Family Medicine

## 2022-01-17 VITALS — Wt 242.0 lb

## 2022-01-17 DIAGNOSIS — R051 Acute cough: Secondary | ICD-10-CM | POA: Diagnosis not present

## 2022-01-17 DIAGNOSIS — R0981 Nasal congestion: Secondary | ICD-10-CM

## 2022-01-17 MED ORDER — DOXYCYCLINE HYCLATE 100 MG PO TABS: 100.0000 mg | ORAL_TABLET | Freq: Two times a day (BID) | ORAL | 0 refills | Status: DC

## 2022-01-17 NOTE — Progress Notes (Signed)
Virtual Visit via Video Note  I connected with Jodi Harris  on 01/17/22 at  6:20 PM EST by a video enabled telemedicine application and verified that I am speaking with the correct person using two identifiers.  Location patient: [place] Location provider:work or home office Persons participating in the virtual visit: patient, provider  I discussed the limitations and requested verbal permission for telemedicine visit. The patient expressed understanding and agreed to proceed.   HPI:  Acute telemedicine visit for Cough/sinus issues: -Onset: she initially got sick about 3 weeks ago (husband tested positive for covid, she tested negative) -Symptoms include: persistent cough and thick sinus congestion and drainage, sinus drainage is yellow and thick, some sinus discomfort at times -Denies: CP, SOB, fever, malaise,  -feels like has turned into a sinus infection and is requesting and abx, reports gets this rarely but usually is treated with abx with her PCP -Pertinent past medical history: see below, hx of sinus infection -Pertinent medication allergies: Allergies  Allergen Reactions   Codeine Sulfate    Penicillins   -COVID-19 vaccine status:  Immunization History  Administered Date(s) Administered   Fluad Quad(high Dose 65+) 10/27/2020   Influenza Split 12/03/2011, 11/12/2012, 11/13/2013   Influenza, High Dose Seasonal PF 11/23/2017   Influenza,inj,Quad PF,6+ Mos 12/06/2021   Influenza,inj,quad, With Preservative 11/17/2016, 10/23/2018   Influenza-Unspecified 11/14/2014, 11/04/2015, 11/28/2019   Moderna Covid-19 Vaccine Bivalent Booster 88yrs & up 11/22/2020   Moderna Sars-Covid-2 Vaccination 01/21/2019, 02/18/2019, 11/06/2019   Pneumococcal Conjugate-13 04/24/2013   Pneumococcal Polysaccharide-23 02/02/2012   Tdap 03/10/2010, 09/01/2020   Zoster Recombinat (Shingrix) 12/14/2017, 03/13/2018, 06/20/2018   Zoster, Live 07/01/2008     ROS: See pertinent positives and negatives per  HPI.  Past Medical History:  Diagnosis Date   Anemia    Diverticulosis    GERD (gastroesophageal reflux disease)    History of colonic polyps    Hypercholesterolemia    Hypothyroidism    Internal hemorrhoids    Thrombophlebitis    varicosities    Past Surgical History:  Procedure Laterality Date   COLON SURGERY     laparoscopic assisted transverse colectomy for tubular adenomatous polyp   COLONOSCOPY     COLONOSCOPY WITH PROPOFOL N/A 07/29/2018   Procedure: COLONOSCOPY WITH PROPOFOL;  Surgeon: Lollie Sails, MD;  Location: [facility] ENDOSCOPY;  Service: Endoscopy;  Laterality: N/A;   DIAGNOSTIC LAPAROSCOPY     LAPAROSCOPIC GASTRIC BANDING     URETHRAL DIVERTICULUM REPAIR  1974   Dr Redmond Baseman     Current Outpatient Medications:    Cholecalciferol (VITAMIN D-3 PO), Take 2,000 Units by mouth daily., Disp: , Rfl:    doxycycline (VIBRA-TABS) 100 MG tablet, Take 1 tablet (100 mg total) by mouth 2 (two) times daily., Disp: 14 tablet, Rfl: 0   levothyroxine (SYNTHROID) 112 MCG tablet, Take 1 tablet (112 mcg total) by mouth daily., Disp: 90 tablet, Rfl: 3   predniSONE (DELTASONE) 1 MG tablet, Take 4 mg by mouth daily with breakfast., Disp: , Rfl:    fluticasone (FLONASE) 50 MCG/ACT nasal spray, Place 2 sprays into both nostrils daily as needed. (Patient not taking: Reported on 01/17/2022), Disp: 16 g, Rfl: 5  EXAM:  VITALS per patient if applicable:  GENERAL: alert, oriented, appears well and in no acute distress  HEENT: atraumatic, conjunttiva clear, no obvious abnormalities on inspection of external nose and ears  NECK: normal movements of the head and neck  LUNGS: on inspection no signs of respiratory distress, breathing rate appears normal, no obvious gross  SOB, gasping or wheezing  CV: no obvious cyanosis  MS: moves all visible extremities without noticeable abnormality  PSYCH/NEURO: pleasant and cooperative, no obvious depression or anxiety, speech and thought processing  grossly intact  ASSESSMENT AND PLAN:  Discussed the following assessment and plan:  Nasal congestion  Acute cough  -we discussed possible serious and likely etiologies, options for evaluation and workup, limitations of telemedicine visit vs in person visit, treatment, treatment risks and precautions. Pt is agreeable to treatment via telemedicine at this moment. Query sinusitis vs other. She opted for nasal saline, OTC cough medication and initiation of doxy after discussion options/risks.  Advised to seek prompt virtual visit or in person care if worsening, new symptoms arise, or if is not improving with treatment as expected per our conversation of expected course.  I discussed the assessment and treatment plan with the patient. The patient was provided an opportunity to ask questions and all were answered. The patient agreed with the plan and demonstrated an understanding of the instructions.     Terressa Koyanagi, DO

## 2022-01-17 NOTE — Patient Instructions (Signed)
-  I sent the medication(s) we discussed to your pharmacy: Meds ordered this encounter  Medications   doxycycline (VIBRA-TABS) 100 MG tablet    Sig: Take 1 tablet (100 mg total) by mouth 2 (two) times daily.    Dispense:  14 tablet    Refill:  0     I hope you are feeling better soon!  Seek in person care promptly if your symptoms worsen, new concerns arise or you are not improving with treatment.  It was nice to meet you today. I help [place] out with telemedicine visits on Tuesdays and Thursdays and am happy to help if you need a virtual follow up visit on those days. Otherwise, if you have any concerns or questions following this visit please schedule a follow up visit with your Primary Care office or seek care at a local urgent care clinic to avoid delays in care. If you are having severe or life threatening symptoms please call 911 and/or go to the nearest emergency room.   

## 2022-01-23 ENCOUNTER — Ambulatory Visit: Payer: Medicare PPO

## 2022-01-30 ENCOUNTER — Ambulatory Visit (INDEPENDENT_AMBULATORY_CARE_PROVIDER_SITE_OTHER): Payer: Medicare PPO

## 2022-01-30 DIAGNOSIS — E538 Deficiency of other specified B group vitamins: Secondary | ICD-10-CM | POA: Diagnosis not present

## 2022-01-30 MED ORDER — CYANOCOBALAMIN 1000 MCG/ML IJ SOLN
1000.0000 ug | Freq: Once | INTRAMUSCULAR | Status: AC
  Administered 2022-01-30: 1000 ug via INTRAMUSCULAR

## 2022-01-30 NOTE — Progress Notes (Signed)
Pt presented for their vitamin B12 injection. Pt was identified through two identifiers. Pt tolerated shot well in their right deltoid.  

## 2022-01-31 ENCOUNTER — Ambulatory Visit: Payer: Medicare PPO

## 2022-02-02 DIAGNOSIS — M353 Polymyalgia rheumatica: Secondary | ICD-10-CM | POA: Diagnosis not present

## 2022-02-02 DIAGNOSIS — Z7952 Long term (current) use of systemic steroids: Secondary | ICD-10-CM | POA: Diagnosis not present

## 2022-02-13 ENCOUNTER — Other Ambulatory Visit (INDEPENDENT_AMBULATORY_CARE_PROVIDER_SITE_OTHER): Payer: Medicare PPO

## 2022-02-13 ENCOUNTER — Telehealth: Payer: Self-pay | Admitting: *Deleted

## 2022-02-13 DIAGNOSIS — E7800 Pure hypercholesterolemia, unspecified: Secondary | ICD-10-CM

## 2022-02-13 DIAGNOSIS — E039 Hypothyroidism, unspecified: Secondary | ICD-10-CM | POA: Diagnosis not present

## 2022-02-13 DIAGNOSIS — R739 Hyperglycemia, unspecified: Secondary | ICD-10-CM

## 2022-02-13 LAB — BASIC METABOLIC PANEL
BUN: 17 mg/dL (ref 6–23)
CO2: 30 mEq/L (ref 19–32)
Calcium: 8.8 mg/dL (ref 8.4–10.5)
Chloride: 103 mEq/L (ref 96–112)
Creatinine, Ser: 0.81 mg/dL (ref 0.40–1.20)
GFR: 68.16 mL/min (ref >60.00)
Glucose, Bld: 94 mg/dL (ref 70–99)
Potassium: 4.1 mEq/L (ref 3.5–5.1)
Sodium: 140 mEq/L (ref 135–145)

## 2022-02-13 LAB — HEPATIC FUNCTION PANEL
ALT: 8 U/L (ref 0–35)
AST: 15 U/L (ref 0–37)
Albumin: 3.6 g/dL (ref 3.5–5.2)
Alkaline Phosphatase: 58 U/L (ref 39–117)
Bilirubin, Direct: 0.1 mg/dL (ref 0.0–0.3)
Total Bilirubin: 0.6 mg/dL (ref 0.2–1.2)
Total Protein: 6.1 g/dL (ref 6.0–8.3)

## 2022-02-13 LAB — TSH: TSH: 1.67 u[IU]/mL (ref 0.35–5.50)

## 2022-02-13 LAB — LIPID PANEL
Cholesterol: 222 mg/dL — ABNORMAL HIGH (ref 0–200)
HDL: 60.4 mg/dL (ref >39.00)
LDL Cholesterol: 147 mg/dL — ABNORMAL HIGH (ref 0–99)
NonHDL: 161.37
Total CHOL/HDL Ratio: 4
Triglycerides: 71 mg/dL (ref 0.0–149.0)
VLDL: 14.2 mg/dL (ref 0.0–40.0)

## 2022-02-13 LAB — HEMOGLOBIN A1C: Hgb A1c MFr Bld: 5.7 % (ref 4.6–6.5)

## 2022-02-13 NOTE — Telephone Encounter (Signed)
Called patient. No answer, no voicemail. °

## 2022-02-13 NOTE — Telephone Encounter (Signed)
Pt came in for labs this morning & asked if a B12 lab was being drawn. I informed her that it was not & she wanted to know when it would be checked? She has been on weekly B12 injections for a month now & starting on monthly B12 injections.

## 2022-02-13 NOTE — Telephone Encounter (Signed)
We will recheck with future labs, but too soon to recheck now if has only been on for one month.

## 2022-02-14 NOTE — Telephone Encounter (Signed)
Patient is aware 

## 2022-02-17 ENCOUNTER — Encounter: Payer: Self-pay | Admitting: Internal Medicine

## 2022-02-17 ENCOUNTER — Ambulatory Visit: Payer: Medicare PPO | Admitting: Internal Medicine

## 2022-02-17 VITALS — BP 128/70 | HR 86 | Temp 97.9°F | Resp 16 | Ht 64.0 in | Wt 253.0 lb

## 2022-02-17 DIAGNOSIS — E039 Hypothyroidism, unspecified: Secondary | ICD-10-CM

## 2022-02-17 DIAGNOSIS — K219 Gastro-esophageal reflux disease without esophagitis: Secondary | ICD-10-CM | POA: Diagnosis not present

## 2022-02-17 DIAGNOSIS — E7800 Pure hypercholesterolemia, unspecified: Secondary | ICD-10-CM

## 2022-02-17 DIAGNOSIS — M353 Polymyalgia rheumatica: Secondary | ICD-10-CM

## 2022-02-17 DIAGNOSIS — R739 Hyperglycemia, unspecified: Secondary | ICD-10-CM

## 2022-02-17 DIAGNOSIS — R55 Syncope and collapse: Secondary | ICD-10-CM

## 2022-02-17 DIAGNOSIS — Z86010 Personal history of colonic polyps: Secondary | ICD-10-CM | POA: Diagnosis not present

## 2022-02-17 DIAGNOSIS — F439 Reaction to severe stress, unspecified: Secondary | ICD-10-CM

## 2022-02-17 DIAGNOSIS — E538 Deficiency of other specified B group vitamins: Secondary | ICD-10-CM

## 2022-02-17 DIAGNOSIS — E559 Vitamin D deficiency, unspecified: Secondary | ICD-10-CM

## 2022-02-17 MED ORDER — ROSUVASTATIN CALCIUM 5 MG PO TABS: 5.0000 mg | ORAL_TABLET | Freq: Every day | ORAL | 1 refills | Status: DC

## 2022-02-17 MED ORDER — ZEPBOUND 2.5 MG/0.5ML ~~LOC~~ SOAJ: 2.5000 mg | SUBCUTANEOUS | 2 refills | Status: DC

## 2022-02-17 NOTE — Progress Notes (Unsigned)
Subjective:    Patient ID: Jodi Harris, female    DOB: 1940-07-15, 82 y.o.   MRN: [id number]  Patient here for  Chief Complaint  Patient presents with   Medical Management of Chronic Issues    HPI Here to follow up regarding hypercholesterolemia, PMR and hypothyroidism.  Was evaluated 10/04/21 by cardiology - for syncope. Elected to monitor. Saw neurology.  Scheduled for EEG 11/2021.  MRI brain 09/2021 - No acute intracranial pathology. 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 Bilateral Carotid Duplex US 09/22/2021: Color duplex indicates minimal homogeneous plaque, with no hemodynamically significant stenosis by duplex criteria in the extracranial cerebrovascular circulation. No further episodes. She has been seeing Dr Patel/William Defoor - rheumatology - f/u PMR.  Off prednisone since 02/07/22.  Does report some joint issues/pain.  Discussed f/u with rheumatology.  Recently started B12 injections.  Discussed recent labs. A1c 5.7.  cholesterol increased.  Discussed daily crestor.  She reports increased fatigue.  Tired throughout the day.  Discussed possible sleep apnea.  No chest pain.  Breathing overall appears to be stable.  No increased cough or congestion.  No abdominal pain.  No swallowing issues.  Main concern is her weight.  Is interested in starting a GLP-1 agonist.  Discussed possible side effects.  Wants to try zepbound.  Had previously been on ozempic.     Past Medical History:  Diagnosis Date   Anemia    Diverticulosis    GERD (gastroesophageal reflux disease)    History of colonic polyps    Hypercholesterolemia    Hypothyroidism    Internal hemorrhoids    Thrombophlebitis    varicosities   Past Surgical History:  Procedure Laterality Date   COLON SURGERY     laparoscopic assisted transverse colectomy for tubular adenomatous polyp   COLONOSCOPY     COLONOSCOPY WITH PROPOFOL N/A 07/29/2018   Procedure: COLONOSCOPY WITH PROPOFOL;  Surgeon: Lollie Sails, MD;  Location: [facility] ENDOSCOPY;  Service: Endoscopy;  Laterality: N/A;   DIAGNOSTIC LAPAROSCOPY     LAPAROSCOPIC GASTRIC BANDING     URETHRAL DIVERTICULUM REPAIR  1974   Dr Redmond Baseman   Family History  Problem Relation Age of Onset   Cancer Father        lymphoma   Cancer Maternal Aunt        Breast   Breast cancer Maternal Aunt 29   Colon cancer Other        one relative on maternal side   Social History   Socioeconomic History   Marital status: Married    Spouse name: Not on file   Number of children: 2   Years of education: Not on file   Highest education level: Not on file  Occupational History   Not on file  Tobacco Use   Smoking status: Never   Smokeless tobacco: Never   Tobacco comments:    smoked as a teenager  Vaping Use   Vaping Use: Never used  Substance and Sexual Activity   Alcohol use: Not Currently    Alcohol/week: 0.0 standard drinks of alcohol    Comment: has a glass of wine about once a month   Drug use: No   Sexual activity: Never  Other Topics Concern   Not on file  Social History Narrative   Not on file   Social Determinants of Health   Financial Resource Strain: Low Risk  (08/26/2020)   Overall Financial Resource Strain (CARDIA)    Difficulty of Paying Living  Expenses: Not hard at all  Food Insecurity: No Food Insecurity (02/23/2021)   Hunger Vital Sign    Worried About Running Out of Food in the Last Year: Never true    Ran Out of Food in the Last Year: Never true  Transportation Needs: No Transportation Needs (02/23/2021)   PRAPARE - Hydrologist (Medical): No    Lack of Transportation (Non-Medical): No  Physical Activity: Insufficiently Active (03/02/2020)   Exercise Vital Sign    Days of Exercise per Week: 1 day    Minutes of Exercise per Session: 30 min  Stress: No Stress Concern Present (02/23/2020)   [place]    Feeling of Stress :  Not at all  Social Connections: Unknown (02/23/2020)   Social Connection and Isolation Panel [NHANES]    Frequency of Communication with Friends and Family: More than three times a week    Frequency of Social Gatherings with Friends and Family: More than three times a week    Attends Religious Services: Not on Advertising copywriter or Organizations: Not on file    Attends Archivist Meetings: Not on file    Marital Status: Married     Review of Systems  Constitutional:  Positive for fatigue.       Discussed weight gain.    HENT:  Negative for congestion and sinus pressure.   Respiratory:  Negative for cough, chest tightness and shortness of breath.   Cardiovascular:  Negative for chest pain and palpitations.       No increased swelling.   Gastrointestinal:  Negative for vomiting.  Genitourinary:  Negative for difficulty urinating and dysuria.  Musculoskeletal:  Negative for joint swelling and myalgias.  Skin:  Negative for color change and rash.  Neurological:  Negative for dizziness and headaches.  Psychiatric/Behavioral:  Negative for agitation and dysphoric mood.        Objective:     BP 128/70   Pulse 86   Temp 97.9 F (36.6 C)   Resp 16   Ht 5' 4"$  (1.626 m)   Wt 253 lb (114.8 kg)   LMP 02/01/1993   SpO2 98%   BMI 43.43 kg/m  Wt Readings from Last 3 Encounters:  02/17/22 253 lb (114.8 kg)  01/17/22 242 lb (109.8 kg)  10/17/21 247 lb 6.4 oz (112.2 kg)    Physical Exam Vitals reviewed.  Constitutional:      General: She is not in acute distress.    Appearance: Normal appearance.  HENT:     Head: Normocephalic and atraumatic.     Right Ear: External ear normal.     Left Ear: External ear normal.  Eyes:     General: No scleral icterus.       Right eye: No discharge.        Left eye: No discharge.     Conjunctiva/sclera: Conjunctivae normal.  Neck:     Thyroid: No thyromegaly.  Cardiovascular:     Rate and Rhythm: Normal rate and  regular rhythm.  Pulmonary:     Effort: No respiratory distress.     Breath sounds: Normal breath sounds. No wheezing.  Abdominal:     General: Bowel sounds are normal.     Palpations: Abdomen is soft.     Tenderness: There is no abdominal tenderness.  Musculoskeletal:        General: No swelling or tenderness.  Cervical back: Neck supple. No tenderness.  Lymphadenopathy:     Cervical: No cervical adenopathy.  Skin:    Findings: No erythema or rash.  Neurological:     Mental Status: She is alert.  Psychiatric:        Mood and Affect: Mood normal.        Behavior: Behavior normal.      Outpatient Encounter Medications as of 02/17/2022  Medication Sig   rosuvastatin (CRESTOR) 5 MG tablet Take 1 tablet (5 mg total) by mouth daily.   tirzepatide (ZEPBOUND) 2.5 MG/0.5ML Pen Inject 2.5 mg into the skin once a week.   Cholecalciferol (VITAMIN D-3 PO) Take 2,000 Units by mouth daily.   levothyroxine (SYNTHROID) 112 MCG tablet Take 1 tablet (112 mcg total) by mouth daily.   [DISCONTINUED] doxycycline (VIBRA-TABS) 100 MG tablet Take 1 tablet (100 mg total) by mouth 2 (two) times daily.   [DISCONTINUED] fluticasone (FLONASE) 50 MCG/ACT nasal spray Place 2 sprays into both nostrils daily as needed. (Patient not taking: Reported on 01/17/2022)   [DISCONTINUED] predniSONE (DELTASONE) 1 MG tablet Take 4 mg by mouth daily with breakfast.   No facility-administered encounter medications on file as of 02/17/2022.     Lab Results  Component Value Date   WBC 6.6 05/16/2021   HGB 12.8 05/16/2021   HCT 40.1 05/16/2021   PLT 229.0 05/16/2021   GLUCOSE 94 02/13/2022   CHOL 222 (H) 02/13/2022   TRIG 71.0 02/13/2022   HDL 60.40 02/13/2022   LDLDIRECT 129.8 02/15/2012   LDLCALC 147 (H) 02/13/2022   ALT 8 02/13/2022   AST 15 02/13/2022   NA 140 02/13/2022   K 4.1 02/13/2022   CL 103 02/13/2022   CREATININE 0.81 02/13/2022   BUN 17 02/13/2022   CO2 30 02/13/2022   TSH 1.67 02/13/2022    HGBA1C 5.7 02/13/2022    US Carotid Duplex Bilateral  Result Date: 09/23/2021 CLINICAL DATA:  82 year old female with syncope EXAM: BILATERAL CAROTID DUPLEX ULTRASOUND TECHNIQUE: Pearline Cables scale imaging, color Doppler and duplex ultrasound were performed of bilateral carotid and vertebral arteries in the neck. COMPARISON:  None Available. FINDINGS: Criteria: Quantification of carotid stenosis is based on velocity parameters that correlate the residual internal carotid diameter with NASCET-based stenosis levels, using the diameter of the distal internal carotid lumen as the denominator for stenosis measurement. The following velocity measurements were obtained: RIGHT ICA:  Systolic 70 cm/sec, Diastolic 19 cm/sec CCA:  66 cm/sec SYSTOLIC ICA/CCA RATIO:  1.1 ECA:  64 cm/sec LEFT ICA:  Systolic 83 cm/sec, Diastolic 20 cm/sec CCA:  68 cm/sec SYSTOLIC ICA/CCA RATIO:  1.2 ECA:  51 cm/sec Right Brachial SBP: Not acquired Left Brachial SBP: Not acquired RIGHT CAROTID ARTERY: No significant calcified disease of the right common carotid artery. Intermediate waveform maintained. Homogeneous plaque without significant calcifications at the right carotid bifurcation. Low resistance waveform of the right ICA. No significant tortuosity. RIGHT VERTEBRAL ARTERY: Antegrade flow with low resistance waveform. LEFT CAROTID ARTERY: No significant calcified disease of the left common carotid artery. Intermediate waveform maintained. Homogeneous plaque at the left carotid bifurcation without significant calcifications. Low resistance waveform of the left ICA. LEFT VERTEBRAL ARTERY:  Antegrade flow with low resistance waveform. IMPRESSION: Color duplex indicates minimal homogeneous plaque, with no hemodynamically significant stenosis by duplex criteria in the extracranial cerebrovascular circulation. Signed, Dulcy Fanny. Nadene Rubins, RPVI [facility] Electronically Signed   By:  Corrie Mckusick D.O.   On: 09/23/2021 09:22  Assessment & Plan:  Hypercholesterolemia Assessment & Plan: Low cholesterol diet and exercise.  Follow lipid panel.  Discussed crestor daily.   Orders: -     Lipid panel; Future -     Hepatic function panel; Future -     Basic metabolic panel; Future -     Hepatic function panel; Future  Hyperglycemia Assessment & Plan: Low carb diet and exercise.  Check met b and a1c.   Orders: -     Hemoglobin A1c; Future  B12 deficiency -     Vitamin B12; Future  Gastroesophageal reflux disease, unspecified whether esophagitis present Assessment & Plan: On nexium.  Upper symptoms controlled.    History of colon polyps Assessment & Plan: Had colonoscopy 07/2018 - redundant colon and diverticulosis.  F/u recommended in 5 years.     Hypothyroidism, unspecified type Assessment & Plan: On thyroid replacement.  Follow tsh.    PMR (polymyalgia rheumatica) (HCC) Assessment & Plan: Off prednisone since 02/07/22.  Has been followed by rheumatology. With some joint aches. Plans to f/u with rheumatology.  Discussed.    Severe obesity (BMI >= 40) (HCC) Assessment & Plan: Is s/p lap band surgery.  Had been on ozempic. Discussed diet and exercise. Interested in restarting GLP-1.  Discussed possible side effects of the medication.  Trial of zepbound.    Orders: -     AMB Referral to Pharmacy Medication Management  Stress Assessment & Plan: On prozac. Appears to be doing well.  Follow.     Syncope, unspecified syncope type Assessment & Plan: Saw neurology and cardiology.  MRI and carotid ultrasound as outlined.  Cardiology elected to follow.  No recurring episodes.  Follow.    Vitamin D deficiency Assessment & Plan: Vitamin D supplements.    Other orders -     Rosuvastatin Calcium; Take 1 tablet (5 mg total) by mouth daily.  Dispense: 90 tablet; Refill: 1 -     Zepbound; Inject 2.5 mg into the skin once a week.  Dispense: 2 mL;  Refill: 2     Einar Pheasant, MD

## 2022-02-19 ENCOUNTER — Encounter: Payer: Self-pay | Admitting: Internal Medicine

## 2022-02-19 NOTE — Assessment & Plan Note (Signed)
Low carb diet and exercise.  Check met b and a1c.   

## 2022-02-19 NOTE — Assessment & Plan Note (Signed)
Had colonoscopy 07/2018 - redundant colon and diverticulosis.  F/u recommended in 5 years.

## 2022-02-19 NOTE — Assessment & Plan Note (Signed)
Is s/p lap band surgery.  Had been on ozempic. Discussed diet and exercise. Interested in restarting GLP-1.  Discussed possible side effects of the medication.  Trial of zepbound.

## 2022-02-19 NOTE — Assessment & Plan Note (Signed)
Saw neurology and cardiology.  MRI and carotid ultrasound as outlined.  Cardiology elected to follow.  No recurring episodes.  Follow.

## 2022-02-19 NOTE — Assessment & Plan Note (Signed)
Vitamin D supplements.

## 2022-02-19 NOTE — Assessment & Plan Note (Signed)
On thyroid replacement.  Follow tsh.  

## 2022-02-19 NOTE — Assessment & Plan Note (Signed)
On nexium.  Upper symptoms controlled.

## 2022-02-19 NOTE — Assessment & Plan Note (Signed)
Off prednisone since 02/07/22.  Has been followed by rheumatology. With some joint aches. Plans to f/u with rheumatology.  Discussed.

## 2022-02-19 NOTE — Assessment & Plan Note (Signed)
On prozac. Appears to be doing well.  Follow.

## 2022-02-19 NOTE — Assessment & Plan Note (Signed)
Low cholesterol diet and exercise.  Follow lipid panel.  Discussed crestor daily.

## 2022-02-21 ENCOUNTER — Telehealth: Payer: Self-pay

## 2022-02-21 NOTE — Progress Notes (Unsigned)
   Care Guide Note  02/21/2022 Name: Thais Silberstein MRN: [id number] DOB: 02-21-1940  Referred by: Einar Pheasant, MD Reason for referral : Care Coordination (Outreach to schedule with pharm d )   Nimra Puccinelli Tool is a 82 y.o. year old female who is a primary care patient of Einar Pheasant, MD. Bunnie Rehberg Ruan was referred to the pharmacist for assistance related to  weightloss .    An unsuccessful telephone outreach was attempted today to contact the patient who was referred to the pharmacy team for assistance with medication assistance. Additional attempts will be made to contact the patient.   Noreene Larsson, [place], [place] [address] Direct Dial: [phone] [email or web address]

## 2022-02-22 NOTE — Progress Notes (Signed)
   Care Guide Note  02/22/2022 Name: Jodi Harris MRN: [id number] DOB: 1940/06/29  Referred by: Einar Pheasant, MD Reason for referral : Care Coordination (Outreach to schedule with pharm d )   Jodi Harris is a 82 y.o. year old female who is a primary care patient of Einar Pheasant, MD. Jodi Harris was referred to the pharmacist for assistance related to DM.    Successful contact was made with the patient to discuss pharmacy services including being ready for the pharmacist to call at least 5 minutes before the scheduled appointment time, to have medication bottles and any blood sugar or blood pressure readings ready for review. The patient agreed to meet with the pharmacist via with the pharmacist via telephone visit on (date/time).  03/16/2022  Jodi Harris, Jodi Harris, [place] [address] Direct Dial: [phone] [email or web address]

## 2022-03-02 ENCOUNTER — Other Ambulatory Visit (INDEPENDENT_AMBULATORY_CARE_PROVIDER_SITE_OTHER): Payer: Medicare PPO

## 2022-03-02 ENCOUNTER — Ambulatory Visit (INDEPENDENT_AMBULATORY_CARE_PROVIDER_SITE_OTHER): Payer: Medicare PPO

## 2022-03-02 VITALS — BP 128/75 | HR 66 | Temp 97.9°F | Ht 64.0 in | Wt 253.0 lb

## 2022-03-02 DIAGNOSIS — Z0000 Encounter for general adult medical examination without abnormal findings: Secondary | ICD-10-CM

## 2022-03-02 DIAGNOSIS — E538 Deficiency of other specified B group vitamins: Secondary | ICD-10-CM | POA: Diagnosis not present

## 2022-03-02 DIAGNOSIS — E7800 Pure hypercholesterolemia, unspecified: Secondary | ICD-10-CM

## 2022-03-02 MED ORDER — CYANOCOBALAMIN 1000 MCG/ML IJ SOLN
1000.0000 ug | Freq: Once | INTRAMUSCULAR | Status: AC
  Administered 2022-03-02: 1000 ug via INTRAMUSCULAR

## 2022-03-02 NOTE — Progress Notes (Addendum)
Patient arrived for her B12 injection. Patient was administered a B12 into her left deltoid. Patient tolerated B12 injection well and did not show any signs of distress or voice any concerns.

## 2022-03-02 NOTE — Progress Notes (Addendum)
Subjective:   Jodi Harris is a 82 y.o. female who presents for Medicare Annual (Subsequent) preventive examination.  Review of Systems    No ROS.  Medicare Wellness   See social history for additional risk factors.   Cardiac Risk Factors include: advanced age (>35mn, >>3women)     Objective:    Today's Vitals   03/02/22 1343  BP: 128/75  Pulse: 66  Temp: 97.9 F (36.6 C)  SpO2: 98%  Weight: 253 lb (114.8 kg)  Height: 5' 4"$  (1.626 m)   Body mass index is 43.43 kg/m.     03/02/2022    1:49 PM 02/23/2021    1:22 PM 02/02/2021    5:40 PM 02/23/2020    5:04 PM 02/20/2019    1:36 PM 07/29/2018   12:32 PM 02/18/2018    2:00 PM  Advanced Directives  Does Patient Have a Medical Advance Directive? Yes Yes No Yes Yes Yes Yes  Type of AParamedicof [place] will   [place]Living will HRon[place]Living will HOssianLiving will HFels[place]Living will  Does patient want to make changes to medical advance directive? No - Patient declined No - Patient declined  No - Patient declined No - Patient declined  No - Patient declined  Copy of HCardiffin Chart? No - copy requested   No - copy requested No - copy requested No - copy requested No - copy requested  Would patient like information on creating a medical advance directive?   No - Patient declined        Current Medications (verified) Outpatient Encounter Medications as of 03/02/2022  Medication Sig   Cholecalciferol (VITAMIN D-3 PO) Take 2,000 Units by mouth daily.   levothyroxine (SYNTHROID) 112 MCG tablet Take 1 tablet (112 mcg total) by mouth daily.   rosuvastatin (CRESTOR) 5 MG tablet Take 1 tablet (5 mg total) by mouth daily.   tirzepatide (ZEPBOUND) 2.5 MG/0.5ML Pen Inject 2.5 mg into the skin once a week.   [EXPIRED] cyanocobalamin (VITAMIN B12) injection 1,000 mcg    No  facility-administered encounter medications on file as of 03/02/2022.    Allergies (verified) Codeine sulfate and Penicillins   History: Past Medical History:  Diagnosis Date   Anemia    Diverticulosis    GERD (gastroesophageal reflux disease)    History of colonic polyps    Hypercholesterolemia    Hypothyroidism    Internal hemorrhoids    Thrombophlebitis    varicosities   Past Surgical History:  Procedure Laterality Date   COLON SURGERY     laparoscopic assisted transverse colectomy for tubular adenomatous polyp   COLONOSCOPY     COLONOSCOPY WITH PROPOFOL N/A 07/29/2018   Procedure: COLONOSCOPY WITH PROPOFOL;  Surgeon: SLollie Sails MD;  [facility]  Service: Endoscopy;  Laterality: N/A;   DIAGNOSTIC LAPAROSCOPY     LAPAROSCOPIC GASTRIC BANDING     URETHRAL DIVERTICULUM REPAIR  1974   Dr BRedmond Baseman  Family History  Problem Relation Age of Onset   Cancer Father        lymphoma   Cancer Maternal Aunt        Breast   Breast cancer Maternal Aunt [id number]  Colon cancer Other        one relative on maternal side   Social History   Socioeconomic History   Marital status: Married    Spouse name: Not on  file   Number of children: 2   Years of education: Not on file   Highest education level: Not on file  Occupational History   Not on file  Tobacco Use   Smoking status: Never   Smokeless tobacco: Never   Tobacco comments:    smoked as a teenager  Vaping Use   Vaping Use: Never used  Substance and Sexual Activity   Alcohol use: Not Currently    Alcohol/week: 0.0 standard drinks of alcohol    Comment: has a glass of wine about once a month   Drug use: No   Sexual activity: Never  Other Topics Concern   Not on file  Social History Narrative   Not on file   Social Determinants of Health   Financial Resource Strain: Low Risk  (03/02/2022)   Overall Financial Resource Strain (CARDIA)    Difficulty of Paying Living Expenses: Not hard at all  Food  Insecurity: No Food Insecurity (02/23/2021)   Hunger Vital Sign    Worried About Running Out of Food in the Last Year: Never true    Ran Out of Food in the Last Year: Never true  Transportation Needs: No Transportation Needs (03/02/2022)   PRAPARE - Hydrologist (Medical): No    Lack of Transportation (Non-Medical): No  Physical Activity: Insufficiently Active (03/02/2022)   Exercise Vital Sign    Days of Exercise per Week: 5 days    Minutes of Exercise per Session: 10 min  Stress: No Stress Concern Present (03/02/2022)   [place]    Feeling of Stress : Not at all  Social Connections: Unknown (03/02/2022)   Social Connection and Isolation Panel [NHANES]    Frequency of Communication with Friends and Family: More than three times a week    Frequency of Social Gatherings with Friends and Family: More than three times a week    Attends Religious Services: Not on Advertising copywriter or Organizations: Not on file    Attends Archivist Meetings: Not on file    Marital Status: Married    Tobacco Counseling Counseling given: Not Answered Tobacco comments: smoked as a teenager   Clinical Intake:  Pre-visit preparation completed: Yes        Diabetes: No  How often do you need to have someone help you when you read instructions, pamphlets, or other written materials from your doctor or pharmacy?: 1 - Never   Interpreter Needed?: No      Activities of Daily Living    03/02/2022    1:52 PM  In your present state of health, do you have any difficulty performing the following activities:  Hearing? 0  Vision? 0  Difficulty concentrating or making decisions? 0  Walking or climbing stairs? 0  Comment Avoids stairs. Paces self with activity.  Dressing or bathing? 0  Doing errands, shopping? 0  Preparing Food and eating ? N  Using the Toilet? N  In the past six  months, have you accidently leaked urine? Y  Comment Managed with daily pad  Do you have problems with loss of bowel control? N  Managing your Medications? N  Managing your Finances? N  Housekeeping or managing your Housekeeping? N    Patient Care Team: Einar Pheasant, MD as PCP - General (Internal Medicine)  Indicate any recent Medical Services you may have received from other than [facility] providers in the past  year (date may be approximate).     Assessment:   This is a routine wellness examination for Jodi Harris.  Hearing/Vision screen Hearing Screening - Comments::  Patient is able to hear conversational tones without difficulty. No issues reported. Vision Screening - Comments:: Followed by Dr. Graciella Belton Followed by Dr. George Ina, [facility]  Dietary issues and exercise activities discussed: Current Exercise Habits: Home exercise routine, Intensity: Mild Regular diet   Goals Addressed               This Visit's Progress     Patient Stated     Increase physical activity (pt-stated)   On track     Walk more for exercise        Depression Screen    03/02/2022    1:47 PM 09/13/2021    2:57 PM 05/23/2021    3:45 PM 02/23/2021    1:21 PM 12/09/2020   12:03 PM 02/23/2020    1:45 PM 12/29/2019   11:12 AM  PHQ 2/9 Scores  PHQ - 2 Score 0 0 0 0 0 0 0    Fall Risk    03/02/2022    1:52 PM 02/17/2022    2:06 PM 10/17/2021    1:13 PM 09/13/2021    2:56 PM 05/23/2021    3:45 PM  [place] in the past year? 0 0 1 1 0  Number falls in past yr: 0 0 1 1   Injury with Fall? 0 0 1 1   Risk for fall due to :  No Fall Risks Impaired balance/gait History of fall(s) No Fall Risks  Follow up Falls evaluation completed;Falls prevention discussed Falls evaluation completed Falls evaluation completed Falls evaluation completed Falls evaluation completed    FALL RISK PREVENTION PERTAINING TO THE HOME: Home free of loose throw rugs in walkways, pet beds, electrical  cords, etc? Yes  Adequate lighting in your home to reduce risk of falls? Yes   ASSISTIVE DEVICES UTILIZED TO PREVENT FALLS: Life alert? No  Use of a cane, walker or w/c? No   TIMED UP AND GO: Was the test performed? Yes .  Gait steady with pace of 10 steps in 12 seconds.   Cognitive Function:    02/15/2017    2:12 PM 01/29/2015   11:15 AM  MMSE - Mini Mental State Exam  Orientation to time 5 5  Orientation to Place 5 5  Registration 3 3  Attention/ Calculation 5 5  Recall 3 3  Language- name 2 objects 2 2  Language- repeat 1 1  Language- follow 3 step command 3 3  Language- read & follow direction 1 1  Write a sentence 1 1  Copy design 1 1  Total score 30 30        03/02/2022    1:54 PM 02/20/2019    1:44 PM 02/18/2018    2:08 PM 02/14/2016    1:24 PM  6CIT Screen  What Year? 0 points 0 points 0 points 0 points  What month? 0 points 0 points 0 points 0 points  What time? 0 points 0 points 0 points 0 points  Count back from 20 0 points 0 points 0 points 0 points  Months in reverse 0 points 0 points 0 points 0 points  Repeat phrase 0 points 0 points 0 points 0 points  Total Score 0 points 0 points 0 points 0 points    Immunizations Immunization History  Administered Date(s) Administered  Fluad Quad(high Dose 65+) 10/27/2020   Influenza Split 12/03/2011, 11/12/2012, 11/13/2013   Influenza, High Dose Seasonal PF 11/23/2017   Influenza,inj,Quad PF,6+ Mos 12/06/2021   Influenza,inj,quad, With Preservative 11/17/2016, 10/23/2018   Influenza-Unspecified 11/14/2014, 11/04/2015, 11/28/2019   Moderna Covid-19 Vaccine Bivalent Booster 47yr & up 11/22/2020   Moderna Sars-Covid-2 Vaccination 01/21/2019, 02/18/2019, 11/06/2019   Pneumococcal Conjugate-13 04/24/2013   Pneumococcal Polysaccharide-23 02/02/2012   Tdap 03/10/2010, 09/01/2020   Zoster Recombinat (Shingrix) 12/14/2017, 03/13/2018, 06/20/2018   Zoster, Live 07/01/2008   Screening Tests Health Maintenance  Topic  Date Due   MAMMOGRAM  11/11/2021   COVID-19 Vaccine (5 - 2023-24 season) 10/18/2022 (Originally 09/09/2021)   Medicare Annual Wellness (AWV)  03/03/2023   COLONOSCOPY (Pts 45-455yrInsurance coverage will need to be confirmed)  07/29/2023   DTaP/Tdap/Td (3 - Td or Tdap) 09/02/2030   Pneumonia Vaccine 6595Years old  Completed   INFLUENZA VACCINE  Completed   DEXA SCAN  Completed   Zoster Vaccines- Shingrix  Completed   HPV VACCINES  Aged Out   Health Maintenance Health Maintenance Due  Topic Date Due   MAMMOGRAM  11/11/2021   Lung Cancer Screening: (Low Dose CT Chest recommended if Age 82-80ears, 30 pack-year currently smoking OR have quit w/in 15years.) does not qualify.   Hepatitis C Screening: does not qualify.  Vision Screening: Recommended annual ophthalmology exams for early detection of glaucoma and other disorders of the eye.  Dental Screening: Recommended annual dental exams for proper oral hygiene.  Community Resource Referral / Chronic Care Management: CRR required this visit?  No   CCM required this visit?  No      Plan:     I have personally reviewed and noted the following in the patient's chart:   Medical and social history Use of alcohol, tobacco or illicit drugs  Current medications and supplements including opioid prescriptions. Patient is not currently taking opioid prescriptions. Functional ability and status Nutritional status Physical activity Advanced directives List of other physicians Hospitalizations, surgeries, and ER visits in previous 12 months Vitals Screenings to include cognitive, depression, and falls Referrals and appointments  In addition, I have reviewed and discussed with patient certain preventive protocols, quality metrics, and best practice recommendations. A written personalized care plan for preventive services as well as general preventive health recommendations were provided to patient.     DeLeta Jungling LPN   [id number]

## 2022-03-02 NOTE — Patient Instructions (Addendum)
[name] , Thank you for taking time to come for your Medicare Wellness Visit. I appreciate your ongoing commitment to your health goals. Please review the following plan we discussed and let me know if I can assist you in the future.   These are the goals we discussed:  Goals       Patient Stated     Increase physical activity (pt-stated)      Walk more for exercise         This is a list of the screening recommended for you and due dates:  Health Maintenance  Topic Date Due   Mammogram  11/11/2021   COVID-19 Vaccine (5 - 2023-24 season) 10/18/2022*   Medicare Annual Wellness Visit  03/03/2023   Colon Cancer Screening  07/29/2023   DTaP/Tdap/Td vaccine (3 - Td or Tdap) 09/02/2030   Pneumonia Vaccine  Completed   Flu Shot  Completed   DEXA scan (bone density measurement)  Completed   Zoster (Shingles) Vaccine  Completed   HPV Vaccine  Aged Out  *Topic was postponed. The date shown is not the original due date.    Advanced directives: End of life planning; Advance aging; Advanced directives discussed.  Copy of current HCPOA/Living Will requested.    Conditions/risks identified: none new  Next appointment: Follow up in one year for your annual wellness visit    Preventive Care 65 Years and Older, Female Preventive care refers to lifestyle choices and visits with your health care provider that can promote health and wellness. What does preventive care include? A yearly physical exam. This is also called an annual well check. Dental exams once or twice a year. Routine eye exams. Ask your health care provider how often you should have your eyes checked. Personal lifestyle choices, including: Daily care of your teeth and gums. Regular physical activity. Eating a healthy diet. Avoiding tobacco and drug use. Limiting alcohol use. Practicing safe sex. Taking low-dose aspirin every day. Taking vitamin and mineral supplements as recommended by your health care  provider. What happens during an annual well check? The services and screenings done by your health care provider during your annual well check will depend on your age, overall health, lifestyle risk factors, and family history of disease. Counseling  Your health care provider may ask you questions about your: Alcohol use. Tobacco use. Drug use. Emotional well-being. Home and relationship well-being. Sexual activity. Eating habits. History of falls. Memory and ability to understand (cognition). Work and work Statistician. Reproductive health. Screening  You may have the following tests or measurements: Height, weight, and BMI. Blood pressure. Lipid and cholesterol levels. These may be checked every 5 years, or more frequently if you are over 75 years old. Skin check. Lung cancer screening. You may have this screening every year starting at age 15 if you have a 30-pack-year history of smoking and currently smoke or have quit within the past 15 years. Fecal occult blood test (FOBT) of the stool. You may have this test every year starting at age 10. Flexible sigmoidoscopy or colonoscopy. You may have a sigmoidoscopy every 5 years or a colonoscopy every 10 years starting at age 75. Hepatitis C blood test. Hepatitis B blood test. Sexually transmitted disease (STD) testing. Diabetes screening. This is done by checking your blood sugar (glucose) after you have not eaten for a while (fasting). You may have this done every 1-3 years. Bone density scan. This is done to screen for osteoporosis. You may have this done starting  at age 3. Mammogram. This may be done every 1-2 years. Talk to your health care provider about how often you should have regular mammograms. Talk with your health care provider about your test results, treatment options, and if necessary, the need for more tests. Vaccines  Your health care provider may recommend certain vaccines, such as: Influenza vaccine. This is  recommended every year. Tetanus, diphtheria, and acellular pertussis (Tdap, Td) vaccine. You may need a Td booster every 10 years. Zoster vaccine. You may need this after age 66. Pneumococcal 13-valent conjugate (PCV13) vaccine. One dose is recommended after age 46. Pneumococcal polysaccharide (PPSV23) vaccine. One dose is recommended after age 81. Talk to your health care provider about which screenings and vaccines you need and how often you need them. This information is not intended to replace advice given to you by your health care provider. Make sure you discuss any questions you have with your health care provider. Document Released: 01/22/2015 Document Revised: 09/15/2015 Document Reviewed: 10/27/2014 Elsevier Interactive Patient Education  2017 [place] Prevention in the Home Falls can cause injuries. They can happen to people of all ages. There are many things you can do to make your home safe and to help prevent falls. What can I do on the outside of my home? Regularly fix the edges of walkways and driveways and fix any cracks. Remove anything that might make you trip as you walk through a door, such as a raised step or threshold. Trim any bushes or trees on the path to your home. Use bright outdoor lighting. Clear any walking paths of anything that might make someone trip, such as rocks or tools. Regularly check to see if handrails are loose or broken. Make sure that both sides of any steps have handrails. Any raised decks and porches should have guardrails on the edges. Have any leaves, snow, or ice cleared regularly. Use sand or salt on walking paths during winter. Clean up any spills in your garage right away. This includes oil or grease spills. What can I do in the bathroom? Use night lights. Install grab bars by the toilet and in the tub and shower. Do not use towel bars as grab bars. Use non-skid mats or decals in the tub or shower. If you need to sit down in  the shower, use a plastic, non-slip stool. Keep the floor dry. Clean up any water that spills on the floor as soon as it happens. Remove soap buildup in the tub or shower regularly. Attach bath mats securely with double-sided non-slip rug tape. Do not have throw rugs and other things on the floor that can make you trip. What can I do in the bedroom? Use night lights. Make sure that you have a light by your bed that is easy to reach. Do not use any sheets or blankets that are too big for your bed. They should not hang down onto the floor. Have a firm chair that has side arms. You can use this for support while you get dressed. Do not have throw rugs and other things on the floor that can make you trip. What can I do in the kitchen? Clean up any spills right away. Avoid walking on wet floors. Keep items that you use a lot in easy-to-reach places. If you need to reach something above you, use a strong step stool that has a grab bar. Keep electrical cords out of the way. Do not use floor polish or wax that makes  floors slippery. If you must use wax, use non-skid floor wax. Do not have throw rugs and other things on the floor that can make you trip. What can I do with my stairs? Do not leave any items on the stairs. Make sure that there are handrails on both sides of the stairs and use them. Fix handrails that are broken or loose. Make sure that handrails are as long as the stairways. Check any carpeting to make sure that it is firmly attached to the stairs. Fix any carpet that is loose or worn. Avoid having throw rugs at the top or bottom of the stairs. If you do have throw rugs, attach them to the floor with carpet tape. Make sure that you have a light switch at the top of the stairs and the bottom of the stairs. If you do not have them, ask someone to add them for you. What else can I do to help prevent falls? Wear shoes that: Do not have high heels. Have rubber bottoms. Are comfortable  and fit you well. Are closed at the toe. Do not wear sandals. If you use a stepladder: Make sure that it is fully opened. Do not climb a closed stepladder. Make sure that both sides of the stepladder are locked into place. Ask someone to hold it for you, if possible. Clearly mark and make sure that you can see: Any grab bars or handrails. First and last steps. Where the edge of each step is. Use tools that help you move around (mobility aids) if they are needed. These include: Canes. Walkers. Scooters. Crutches. Turn on the lights when you go into a dark area. Replace any light bulbs as soon as they burn out. Set up your furniture so you have a clear path. Avoid moving your furniture around. If any of your floors are uneven, fix them. If there are any pets around you, be aware of where they are. Review your medicines with your doctor. Some medicines can make you feel dizzy. This can increase your chance of falling. Ask your doctor what other things that you can do to help prevent falls. This information is not intended to replace advice given to you by your health care provider. Make sure you discuss any questions you have with your health care provider. Document Released: 10/22/2008 Document Revised: 06/03/2015 Document Reviewed: 01/30/2014 Elsevier Interactive Patient Education  2017 [employer].

## 2022-03-03 ENCOUNTER — Other Ambulatory Visit: Payer: Medicare PPO

## 2022-03-03 LAB — HEPATIC FUNCTION PANEL
ALT: 7 U/L (ref 0–35)
AST: 14 U/L (ref 0–37)
Albumin: 3.8 g/dL (ref 3.5–5.2)
Alkaline Phosphatase: 57 U/L (ref 39–117)
Bilirubin, Direct: 0.1 mg/dL (ref 0.0–0.3)
Total Bilirubin: 0.5 mg/dL (ref 0.2–1.2)
Total Protein: 6.2 g/dL (ref 6.0–8.3)

## 2022-03-06 ENCOUNTER — Telehealth: Payer: Self-pay

## 2022-03-06 NOTE — Telephone Encounter (Signed)
Patient states she is experiencing weakness and pain in her right wrist, no redness, no swelling.  Patient states she Patient states this started about a week ago. Patient states she can't hold a cup of coffee up with her right hand, she has to support it with her other hand.  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 Patient states she spoke with her rheumatologist who states she should see her PCP.  I transferred call to Access Nurse.

## 2022-03-06 NOTE — Telephone Encounter (Signed)
Jodi Harris called from Access Nurse to state patient needs to be seen in the next 2-3 days.  I offered patient an appointment with Tomasita Morrow, NP, on 03/07/2022 or if she would like to wait until Wednesday, we can schedule her to see Dr. Einar Pheasant.  Patient states she would like to go ahead and be seen tomorrow.  Patient has been scheduled to see Tomasita Morrow, NP.

## 2022-03-07 ENCOUNTER — Ambulatory Visit
Admission: RE | Admit: 2022-03-07 | Discharge: 2022-03-07 | Disposition: A | Discharge status: Home / Self Care | Payer: Medicare PPO | Attending: Nurse Practitioner | Admitting: Nurse Practitioner

## 2022-03-07 ENCOUNTER — Ambulatory Visit: Payer: Medicare PPO | Admitting: Nurse Practitioner

## 2022-03-07 ENCOUNTER — Ambulatory Visit
Admission: RE | Admit: 2022-03-07 | Discharge: 2022-03-07 | Disposition: A | Discharge status: Home / Self Care | Payer: Medicare PPO | Source: Ambulatory Visit | Attending: Nurse Practitioner | Admitting: Nurse Practitioner

## 2022-03-07 ENCOUNTER — Encounter: Payer: Self-pay | Admitting: Nurse Practitioner

## 2022-03-07 VITALS — BP 124/60 | HR 66 | Temp 97.8°F | Ht 64.0 in | Wt 251.2 lb

## 2022-03-07 DIAGNOSIS — M19031 Primary osteoarthritis, right wrist: Secondary | ICD-10-CM | POA: Diagnosis not present

## 2022-03-07 DIAGNOSIS — M25531 Pain in right wrist: Secondary | ICD-10-CM | POA: Insufficient documentation

## 2022-03-07 NOTE — Assessment & Plan Note (Signed)
Xray right wrist ordered at [facility]. Will contact patient with results. Encouraged to ice/heat painful area and limit activity. Will refer to Ortho.

## 2022-03-07 NOTE — Progress Notes (Signed)
  Tomasita Morrow, NP-C Phone: [phone]  Jodi Harris is a 82 y.o. female who presents today for right wrist pain.  Wrist Pain: Patient complaints of right wrist pain. This is evaluated as a personal injury. The pain began 1 week ago. The pain is located primarily in the radial area, snuffbox and radiates up her forearm. She describes the symptoms as aching and dull at rest and sharp and shooting with movement and gripping. Symptoms improve with rest, heat. The symptoms are worse with movement and gripping. The patient does not have neck pain.Treatment to date has been icy hot cream, without significant relief.  Social History   Tobacco Use  Smoking Status Never  Smokeless Tobacco Never  Tobacco Comments   smoked as a teenager    Current Outpatient Medications on File Prior to Visit  Medication Sig Dispense Refill   Cholecalciferol (VITAMIN D-3 PO) Take 2,000 Units by mouth daily.     levothyroxine (SYNTHROID) 112 MCG tablet Take 1 tablet (112 mcg total) by mouth daily. 90 tablet 3   rosuvastatin (CRESTOR) 5 MG tablet Take 1 tablet (5 mg total) by mouth daily. 90 tablet 1   tirzepatide (ZEPBOUND) 2.5 MG/0.5ML Pen Inject 2.5 mg into the skin once a week. 2 mL 2   No current facility-administered medications on file prior to visit.    ROS see history of present illness  Objective  Physical Exam Vitals:   03/07/22 1454  BP: 124/60  Pulse: 66  Temp: 97.8 F (36.6 C)  SpO2: 98%    BP Readings from Last 3 Encounters:  03/07/22 124/60  03/02/22 128/75  02/17/22 128/70   Wt Readings from Last 3 Encounters:  03/07/22 251 lb 3.2 oz (113.9 kg)  03/02/22 253 lb (114.8 kg)  02/17/22 253 lb (114.8 kg)    Physical Exam Constitutional:      General: She is not in acute distress.    Appearance: Normal appearance.  HENT:     Head: Normocephalic.  Cardiovascular:     Rate and Rhythm: Normal rate and regular rhythm.     Heart sounds: Normal heart sounds.  Pulmonary:      Effort: Pulmonary effort is normal.     Breath sounds: Normal breath sounds.  Musculoskeletal:     Right wrist: Snuff box tenderness present. No swelling or deformity. Decreased range of motion.     Left wrist: Normal.     Comments: Grip strength 4/5- painful. No erythema or warmth noted.   Skin:    General: Skin is warm and dry.  Neurological:     General: No focal deficit present.     Mental Status: She is alert.  Psychiatric:        Mood and Affect: Mood normal.        Behavior: Behavior normal.    Assessment/Plan: Please see individual problem list.  Acute pain of right wrist Assessment & Plan: Xray right wrist ordered at [facility]. Will contact patient with results. Encouraged to ice/heat painful area and limit activity. Will refer to Ortho.  Orders: -     DG Wrist Complete Right; Future -     Ambulatory referral to Orthopedic Surgery   Return if symptoms worsen or fail to improve.   Tomasita Morrow, NP-C [place]

## 2022-03-07 NOTE — Telephone Encounter (Signed)
Noted  

## 2022-03-09 ENCOUNTER — Other Ambulatory Visit: Payer: Medicare PPO | Admitting: Pharmacist

## 2022-03-09 NOTE — Progress Notes (Signed)
03/09/2022 Name: Jodi Harris MRN: [id number] DOB: 07-Sep-1940  Chief Complaint  Patient presents with   Medication Management   Diabetes    Jodi Harris is a 82 y.o. year old female who presented for a telephone visit.   They were referred to the pharmacist by their PCP for assistance in managing medication access.   Subjective:  Care Team: Primary Care Provider: Einar Pheasant, MD ; Next Scheduled Visit: 05/18/22  Medication Access/Adherence  Current Pharmacy:  [facility] [address] [place] [address] Phone: [phone] Fax: [phone]   Patient reports affordability concerns with their medications: Yes  Patient reports access/transportation concerns to their pharmacy: No  Patient reports adherence concerns with their medications:  No    Unable to access Ozempic due to insurance coverage (patient does not have Type 2 Diabetes); Unable to access Zepbound due to insurance coverage (Medicare)   Hyperlipidemia/ASCVD Risk Reduction  Current lipid lowering medications: rosuvastatin 5 mg daily - reports she is actually taking once every 2-3 days; reports a significant headache with use.   Notes she has only been back on statin therapy for ~2-3 weeks. She had stopped while working through polymyalgia rheumatica  Antiplatelet regimen: aspirin 81 mg daily  ASCVD History: none Family History: none that she is aware of   PREVENT Risk Score (using age of 62 and BMI of 39.9) 10 year risk of CVD: 13.6% - 10 year risk of ASCVD: 8.7% - 10 year risk of HF: 13.6%    Objective:  Lab Results  Component Value Date   HGBA1C 5.7 02/13/2022    Lab Results  Component Value Date   CREATININE 0.81 02/13/2022   BUN 17 02/13/2022   NA 140 02/13/2022   K 4.1 02/13/2022   CL 103 02/13/2022   CO2 30 02/13/2022    Lab Results  Component Value Date   CHOL 222 (H) 02/13/2022   HDL 60.40 02/13/2022   LDLCALC 147 (H)  02/13/2022   LDLDIRECT 129.8 02/15/2012   TRIG 71.0 02/13/2022   CHOLHDL 4 02/13/2022    Medications Reviewed Today     Reviewed by Osker Mason, RPH-CPP (Pharmacist) on 03/09/22 at 1139  Med List Status: <None>   Medication Order Taking? Sig Documenting Provider Last Dose Status Informant  alendronate (FOSAMAX) 70 MG tablet [id number] Yes Take 70 mg by mouth once a week. [provider] Taking Active   aspirin EC 81 MG tablet [id number] Yes Take 81 mg by mouth at bedtime as needed for mild pain. Swallow whole. [provider] Taking Active   Cholecalciferol (VITAMIN D-3 PO) [id number] Yes Take 2,000 Units by mouth daily. [provider] Taking Active   levothyroxine (SYNTHROID) 112 MCG tablet [id number] Yes Take 1 tablet (112 mcg total) by mouth daily. Einar Pheasant, MD Taking Active   rosuvastatin (CRESTOR) 5 MG tablet [id number] Yes Take 1 tablet (5 mg total) by mouth daily. Einar Pheasant, MD Taking Active               Assessment/Plan:   Hyperlipidemia/ASCVD Risk Reduction: - Currently uncontrolled.  - Discussed options for cholesterol management. Given age, lack of risk, patient wonders if she needs treatment. Discussed option for coronary artery CT scoring to guide evaluation. If very low score, likely no need for statin or non-statin therapy. If elevated score, could consider non-statin options if patient does not want to remain on statin therapy. Discussed the  fact that patient has been on statin therapy for 2 weeks with cardiology colleague; unlikely to effect CAC score with short duration of use. Patient amenable to out of pocket cost. Will discuss with PCP.    Follow Up Plan: pending CAC scoring  Catie Hedwig Morton, PharmD, [place], [facility] [phone]

## 2022-03-13 NOTE — Progress Notes (Signed)
Thank you Catie.  If pt is agreeable, I will place order for the calcium score.  I can have my nurse call her, but wanted to let you know, I am ok with ordering.  If she has already agreed, I will place order.  Thank you again.  Einar Pheasant

## 2022-03-15 ENCOUNTER — Other Ambulatory Visit: Payer: Self-pay | Admitting: Internal Medicine

## 2022-03-15 DIAGNOSIS — Z136 Encounter for screening for cardiovascular disorders: Secondary | ICD-10-CM

## 2022-03-15 NOTE — Progress Notes (Signed)
Order placed for calcium score (CT)

## 2022-03-16 ENCOUNTER — Other Ambulatory Visit: Payer: Medicare PPO | Admitting: Pharmacist

## 2022-03-22 DIAGNOSIS — M654 Radial styloid tenosynovitis [de Quervain]: Secondary | ICD-10-CM | POA: Diagnosis not present

## 2022-03-23 ENCOUNTER — Ambulatory Visit
Admission: RE | Admit: 2022-03-23 | Discharge: 2022-03-23 | Disposition: A | Discharge status: Home / Self Care | Payer: Medicare PPO | Source: Ambulatory Visit | Attending: Internal Medicine | Admitting: Internal Medicine

## 2022-03-23 DIAGNOSIS — Z136 Encounter for screening for cardiovascular disorders: Secondary | ICD-10-CM | POA: Insufficient documentation

## 2022-03-24 ENCOUNTER — Ambulatory Visit: Payer: Medicare PPO

## 2022-03-24 ENCOUNTER — Other Ambulatory Visit: Payer: Self-pay | Admitting: *Deleted

## 2022-03-24 DIAGNOSIS — E7800 Pure hypercholesterolemia, unspecified: Secondary | ICD-10-CM

## 2022-03-24 MED ORDER — ROSUVASTATIN CALCIUM 10 MG PO TABS: 10.0000 mg | ORAL_TABLET | Freq: Every day | ORAL | 3 refills | Status: DC

## 2022-03-30 ENCOUNTER — Telehealth: Payer: Self-pay

## 2022-03-30 NOTE — Telephone Encounter (Signed)
LM to let patient know that I have scheduled her an appointment with [facility] on 04/04/2022 at 12:00PM

## 2022-04-04 DIAGNOSIS — H539 Unspecified visual disturbance: Secondary | ICD-10-CM | POA: Diagnosis not present

## 2022-04-04 DIAGNOSIS — R600 Localized edema: Secondary | ICD-10-CM | POA: Diagnosis not present

## 2022-04-04 DIAGNOSIS — Z87898 Personal history of other specified conditions: Secondary | ICD-10-CM | POA: Diagnosis not present

## 2022-04-04 DIAGNOSIS — E7800 Pure hypercholesterolemia, unspecified: Secondary | ICD-10-CM | POA: Diagnosis not present

## 2022-04-04 DIAGNOSIS — I2510 Atherosclerotic heart disease of native coronary artery without angina pectoris: Secondary | ICD-10-CM | POA: Diagnosis not present

## 2022-04-04 DIAGNOSIS — I38 Endocarditis, valve unspecified: Secondary | ICD-10-CM | POA: Diagnosis not present

## 2022-04-04 DIAGNOSIS — Z6841 Body Mass Index (BMI) 40.0 and over, adult: Secondary | ICD-10-CM | POA: Diagnosis not present

## 2022-04-24 DIAGNOSIS — I38 Endocarditis, valve unspecified: Secondary | ICD-10-CM | POA: Diagnosis not present

## 2022-04-24 DIAGNOSIS — R600 Localized edema: Secondary | ICD-10-CM | POA: Diagnosis not present

## 2022-05-02 DIAGNOSIS — E7800 Pure hypercholesterolemia, unspecified: Secondary | ICD-10-CM | POA: Diagnosis not present

## 2022-05-02 DIAGNOSIS — R5383 Other fatigue: Secondary | ICD-10-CM | POA: Diagnosis not present

## 2022-05-02 DIAGNOSIS — I38 Endocarditis, valve unspecified: Secondary | ICD-10-CM | POA: Diagnosis not present

## 2022-05-02 DIAGNOSIS — R55 Syncope and collapse: Secondary | ICD-10-CM | POA: Diagnosis not present

## 2022-05-15 ENCOUNTER — Other Ambulatory Visit: Payer: Medicare PPO

## 2022-05-15 DIAGNOSIS — M353 Polymyalgia rheumatica: Secondary | ICD-10-CM | POA: Diagnosis not present

## 2022-05-16 ENCOUNTER — Other Ambulatory Visit (INDEPENDENT_AMBULATORY_CARE_PROVIDER_SITE_OTHER): Payer: Medicare PPO

## 2022-05-16 ENCOUNTER — Other Ambulatory Visit: Payer: Medicare PPO

## 2022-05-16 DIAGNOSIS — E7800 Pure hypercholesterolemia, unspecified: Secondary | ICD-10-CM | POA: Diagnosis not present

## 2022-05-16 DIAGNOSIS — R739 Hyperglycemia, unspecified: Secondary | ICD-10-CM

## 2022-05-16 DIAGNOSIS — E538 Deficiency of other specified B group vitamins: Secondary | ICD-10-CM

## 2022-05-16 LAB — BASIC METABOLIC PANEL
BUN: 15 mg/dL (ref 6–23)
CO2: 32 mEq/L (ref 19–32)
Calcium: 9 mg/dL (ref 8.4–10.5)
Chloride: 102 mEq/L (ref 96–112)
Creatinine, Ser: 0.78 mg/dL (ref 0.40–1.20)
GFR: 71.2 mL/min (ref >60.00)
Glucose, Bld: 82 mg/dL (ref 70–99)
Potassium: 4.1 mEq/L (ref 3.5–5.1)
Sodium: 141 mEq/L (ref 135–145)

## 2022-05-16 LAB — LIPID PANEL
Cholesterol: 132 mg/dL (ref 0–200)
HDL: 53.8 mg/dL (ref >39.00)
LDL Cholesterol: 66 mg/dL (ref 0–99)
NonHDL: 78.27
Total CHOL/HDL Ratio: 2
Triglycerides: 61 mg/dL (ref 0.0–149.0)
VLDL: 12.2 mg/dL (ref 0.0–40.0)

## 2022-05-16 LAB — VITAMIN B12: Vitamin B-12: 289 pg/mL (ref 211–911)

## 2022-05-16 LAB — HEMOGLOBIN A1C: Hgb A1c MFr Bld: 5.7 % (ref 4.6–6.5)

## 2022-05-16 LAB — HEPATIC FUNCTION PANEL
ALT: 9 U/L (ref 0–35)
AST: 16 U/L (ref 0–37)
Albumin: 3.6 g/dL (ref 3.5–5.2)
Alkaline Phosphatase: 58 U/L (ref 39–117)
Bilirubin, Direct: 0.2 mg/dL (ref 0.0–0.3)
Total Bilirubin: 0.7 mg/dL (ref 0.2–1.2)
Total Protein: 6 g/dL (ref 6.0–8.3)

## 2022-05-18 ENCOUNTER — Ambulatory Visit: Payer: Medicare PPO | Admitting: Internal Medicine

## 2022-05-18 ENCOUNTER — Encounter: Payer: Self-pay | Admitting: Internal Medicine

## 2022-05-18 VITALS — BP 118/74 | HR 76 | Temp 98.0°F | Resp 16 | Ht 65.0 in | Wt 250.8 lb

## 2022-05-18 DIAGNOSIS — E7800 Pure hypercholesterolemia, unspecified: Secondary | ICD-10-CM

## 2022-05-18 DIAGNOSIS — M654 Radial styloid tenosynovitis [de Quervain]: Secondary | ICD-10-CM

## 2022-05-18 DIAGNOSIS — E039 Hypothyroidism, unspecified: Secondary | ICD-10-CM

## 2022-05-18 DIAGNOSIS — Z86010 Personal history of colonic polyps: Secondary | ICD-10-CM | POA: Diagnosis not present

## 2022-05-18 DIAGNOSIS — K219 Gastro-esophageal reflux disease without esophagitis: Secondary | ICD-10-CM

## 2022-05-18 DIAGNOSIS — L989 Disorder of the skin and subcutaneous tissue, unspecified: Secondary | ICD-10-CM

## 2022-05-18 DIAGNOSIS — F439 Reaction to severe stress, unspecified: Secondary | ICD-10-CM

## 2022-05-18 DIAGNOSIS — E538 Deficiency of other specified B group vitamins: Secondary | ICD-10-CM | POA: Diagnosis not present

## 2022-05-18 DIAGNOSIS — M353 Polymyalgia rheumatica: Secondary | ICD-10-CM | POA: Diagnosis not present

## 2022-05-18 DIAGNOSIS — R739 Hyperglycemia, unspecified: Secondary | ICD-10-CM

## 2022-05-18 MED ORDER — SCOPOLAMINE 1 MG/3DAYS TD PT72: 1.0000 | MEDICATED_PATCH | TRANSDERMAL | 0 refills | Status: DC

## 2022-05-18 MED ORDER — LEVOTHYROXINE SODIUM 112 MCG PO TABS: 112.0000 ug | ORAL_TABLET | Freq: Every day | ORAL | 3 refills | Status: DC

## 2022-05-18 NOTE — Progress Notes (Signed)
Subjective:    Patient ID: Jodi Harris, female    DOB: 1940-06-20, 82 y.o.   MRN: [id number]  Patient here for  Chief Complaint  Patient presents with   Medical Management of Chronic Issues    HPI Here to follow up regarding hypercholesterolemia, PMR and hypothyroidism.  Seeing rheumatology for f/u PMR.  Off prednisone. Having issues with dequervain's tenosynovitis.  Seeing Dr Rosita Kea.  S/p injection.  Has f/u next week.  Sees cardiology.  Evaluated 05/02/22.  Stable ECHO.  No stenosis on carotid ultrasound.  If recurrent, recommended holter. Recommended surveillance echo q 3-5 years - f/u stable MR and TR. Trying to stay active.  Discussed exercise.  No chest pain reported.  Breathing stable.  No increased cough or congestion.  No abdominal pain reported.  Two lesions left leg - request dermatology referral.  Planning a trip.  Request scopolamine patches.     Past Medical History:  Diagnosis Date   Anemia    Diverticulosis    GERD (gastroesophageal reflux disease)    History of colonic polyps    Hypercholesterolemia    Hypothyroidism    Internal hemorrhoids    Thrombophlebitis    varicosities   Past Surgical History:  Procedure Laterality Date   COLON SURGERY     laparoscopic assisted transverse colectomy for tubular adenomatous polyp   COLONOSCOPY     COLONOSCOPY WITH PROPOFOL N/A 07/29/2018   Procedure: COLONOSCOPY WITH PROPOFOL;  Surgeon: Christena Deem, MD;  Location: [facility] ENDOSCOPY;  Service: Endoscopy;  Laterality: N/A;   DIAGNOSTIC LAPAROSCOPY     LAPAROSCOPIC GASTRIC BANDING     URETHRAL DIVERTICULUM REPAIR  1974   Dr Jenne Pane   Family History  Problem Relation Age of Onset   Cancer Father        lymphoma   Cancer Maternal Aunt        Breast   Breast cancer Maternal Aunt 23   Colon cancer Other        one relative on maternal side   Social History   Socioeconomic History   Marital status: Married    Spouse name: Not on file   Number of children: 2    Years of education: Not on file   Highest education level: Not on file  Occupational History   Not on file  Tobacco Use   Smoking status: Never   Smokeless tobacco: Never   Tobacco comments:    smoked as a teenager  Vaping Use   Vaping Use: Never used  Substance and Sexual Activity   Alcohol use: Not Currently    Alcohol/week: 0.0 standard drinks of alcohol    Comment: has a glass of wine about once a month   Drug use: No   Sexual activity: Never  Other Topics Concern   Not on file  Social History Narrative   Not on file   Social Determinants of Health   Financial Resource Strain: Low Risk  (03/02/2022)   Overall Financial Resource Strain (CARDIA)    Difficulty of Paying Living Expenses: Not hard at all  Food Insecurity: No Food Insecurity (02/23/2021)   Hunger Vital Sign    Worried About Running Out of Food in the Last Year: Never true    Ran Out of Food in the Last Year: Never true  Transportation Needs: No Transportation Needs (03/02/2022)   PRAPARE - Administrator, Civil Service (Medical): No    Lack of Transportation (Non-Medical): No  Physical Activity: Insufficiently  Active (03/02/2022)   Exercise Vital Sign    Days of Exercise per Week: 5 days    Minutes of Exercise per Session: 10 min  Stress: No Stress Concern Present (03/02/2022)   [employer] of Occupational Health - Occupational Stress Questionnaire    Feeling of Stress : Not at all  Social Connections: Unknown (03/02/2022)   Social Connection and Isolation Panel [NHANES]    Frequency of Communication with Friends and Family: More than three times a week    Frequency of Social Gatherings with Friends and Family: More than three times a week    Attends Religious Services: Not on Marketing executive or Organizations: Not on file    Attends Banker Meetings: Not on file    Marital Status: Married     Review of Systems  Constitutional:  Negative for appetite change  and unexpected weight change.  HENT:  Negative for congestion and sinus pressure.   Respiratory:  Negative for cough and chest tightness.        Breathing stable.   Cardiovascular:  Negative for chest pain and palpitations.  Gastrointestinal:  Negative for abdominal pain, diarrhea, nausea and vomiting.  Genitourinary:  Negative for difficulty urinating and dysuria.  Musculoskeletal:  Negative for myalgias.       Wrist pain as outlined.   Skin:  Negative for color change and rash.  Neurological:  Negative for dizziness and headaches.  Psychiatric/Behavioral:  Negative for agitation and dysphoric mood.        Objective:     BP 118/74   Pulse 76   Temp 98 F (36.7 C)   Resp 16   Ht 5\' 5"  (1.651 m)   Wt 250 lb 12.8 oz (113.8 kg)   LMP 02/01/1993   SpO2 98%   BMI 41.74 kg/m  Wt Readings from Last 3 Encounters:  05/18/22 250 lb 12.8 oz (113.8 kg)  03/07/22 251 lb 3.2 oz (113.9 kg)  03/02/22 253 lb (114.8 kg)    Physical Exam Vitals reviewed.  Constitutional:      General: She is not in acute distress.    Appearance: Normal appearance.  HENT:     Head: Normocephalic and atraumatic.     Right Ear: External ear normal.     Left Ear: External ear normal.  Eyes:     General: No scleral icterus.       Right eye: No discharge.        Left eye: No discharge.     Conjunctiva/sclera: Conjunctivae normal.  Neck:     Thyroid: No thyromegaly.  Cardiovascular:     Rate and Rhythm: Normal rate and regular rhythm.  Pulmonary:     Effort: No respiratory distress.     Breath sounds: Normal breath sounds. No wheezing.  Abdominal:     General: Bowel sounds are normal.     Palpations: Abdomen is soft.     Tenderness: There is no abdominal tenderness.  Musculoskeletal:        General: No swelling or tenderness.     Cervical back: Neck supple. No tenderness.  Lymphadenopathy:     Cervical: No cervical adenopathy.  Skin:    Findings: No erythema or rash.  Neurological:      Mental Status: She is alert.  Psychiatric:        Mood and Affect: Mood normal.        Behavior: Behavior normal.      Outpatient Encounter Medications  as of 05/18/2022  Medication Sig   fluticasone (FLONASE) 50 MCG/ACT nasal spray Place into both nostrils daily as needed.   scopolamine (TRANSDERM-SCOP) 1 MG/3DAYS Place 1 patch (1.5 mg total) onto the skin every 3 (three) days.   alendronate (FOSAMAX) 70 MG tablet Take 70 mg by mouth once a week.   aspirin EC 81 MG tablet Take 81 mg by mouth at bedtime as needed for mild pain. Swallow whole.   Cholecalciferol (VITAMIN D-3 PO) Take 2,000 Units by mouth daily.   levothyroxine (SYNTHROID) 112 MCG tablet Take 1 tablet (112 mcg total) by mouth daily.   rosuvastatin (CRESTOR) 10 MG tablet Take 1 tablet (10 mg total) by mouth daily.   [DISCONTINUED] levothyroxine (SYNTHROID) 112 MCG tablet Take 1 tablet (112 mcg total) by mouth daily.   No facility-administered encounter medications on file as of 05/18/2022.     Lab Results  Component Value Date   WBC 6.6 05/16/2021   HGB 12.8 05/16/2021   HCT 40.1 05/16/2021   PLT 229.0 05/16/2021   GLUCOSE 82 05/16/2022   CHOL 132 05/16/2022   TRIG 61.0 05/16/2022   HDL 53.80 05/16/2022   LDLDIRECT 129.8 02/15/2012   LDLCALC 66 05/16/2022   ALT 9 05/16/2022   AST 16 05/16/2022   NA 141 05/16/2022   K 4.1 05/16/2022   CL 102 05/16/2022   CREATININE 0.78 05/16/2022   BUN 15 05/16/2022   CO2 32 05/16/2022   TSH 1.67 02/13/2022   HGBA1C 5.7 05/16/2022    CT CARDIAC SCORING  Addendum Date: 03/26/2022   ADDENDUM REPORT: 03/26/2022 16:31 EXAM: OVER-READ INTERPRETATION  CT CHEST The following report is an over-read performed by radiologist Dr. Layla Maw, M.D. of [facility] on 03/26/2022. This over-read does not include interpretation of cardiac or coronary anatomy or pathology. The coronary calcium score interpretation by the cardiologist is attached. COMPARISON:  None. FINDINGS:  Cardiovascular: Mild cardiomegaly. No pericardial effusion. Atheromatous calcifications coronary arteries. Mediastinum/Nodes: No enlarged mediastinal, hilar, or axillary lymph nodes. Thyroid gland, trachea, and esophagus demonstrate no significant findings. Lungs/Pleura: Imaged lungs are clear. No pleural effusion or pneumothorax. Upper Abdomen: No acute abnormality.  There is a left band. Musculoskeletal: No chest wall abnormality. No acute or significant osseous findings. IMPRESSION: Mild cardiomegaly and atheromatous calcifications coronary arteries. Otherwise no significant extracardiac incidental findings identified. Electronically Signed   By: Layla Maw M.D.   On: 03/26/2022 16:31   Result Date: 03/26/2022 CLINICAL DATA:  Risk stratification EXAM: Coronary Calcium Score TECHNIQUE: The patient was scanned on a Siemens Somatom go.Top Scanner. Axial non-contrast 3 mm slices were carried out through the heart. The data set was analyzed on a dedicated work station and scored using the Agatson method. FINDINGS: Non-cardiac: See separate report from [facility]. Ascending Aorta: Normal size Pericardium: Normal Coronary arteries: Normal origin of left and right coronary arteries. Distribution of arterial calcifications if present, as noted below; LM 0 LAD 176 LCx 0 RCA 18.1 Total 194 IMPRESSION AND RECOMMENDATION: 1. Coronary calcium score of 194. This was 60th percentile for age and sex matched control. 2. CAC 100-299 in LAD, RCA. CAC-DRS A2/N2. 3. Continue heart healthy lifestyle and risk factor modification. Electronically Signed: By: Debbe Odea M.D. On: 03/23/2022 14:59       Assessment & Plan:  Hyperglycemia Assessment & Plan: Low carb diet and exercise.  Check met b and a1c.   Orders: -     Hemoglobin A1c; Future  Hypothyroidism, unspecified type Assessment & Plan:  On thyroid replacement.  Follow tsh.   Orders: -     Levothyroxine Sodium; Take 1 tablet (112 mcg total)  by mouth daily.  Dispense: 90 tablet; Refill: 3 -     TSH; Future  Hypercholesterolemia Assessment & Plan: Low cholesterol diet and exercise.  Follow lipid panel.  Crestor.   Orders: -     Lipid panel; Future -     Hepatic function panel; Future -     Basic metabolic panel; Future  B12 deficiency Assessment & Plan: Check B12 level.   Orders: -     Vitamin B12; Future  Gastroesophageal reflux disease, unspecified whether esophagitis present Assessment & Plan: On nexium.  Upper symptoms controlled.    History of colon polyps Assessment & Plan: Had colonoscopy 07/2018 - redundant colon and diverticulosis.  F/u recommended in 5 years.     PMR (polymyalgia rheumatica) (HCC) Assessment & Plan: Off prednisone since 02/07/22.  Has been followed by rheumatology. Just evaluated as outlined.  Stable.  Follow.    Severe obesity (BMI >= 40) (HCC) Assessment & Plan: Is s/p lap band surgery.  Had been on ozempic. Discussed diet and exercise. Plans to start exercising more.  Follow.      Stress Assessment & Plan: On prozac. Appears to be doing well.  Follow.     Skin lesions Assessment & Plan: Persistent skin lesions - left leg. Refer to dermatology for evaluation and treatment.     Orders: -     Ambulatory referral to Dermatology  De Quervain's disease (tenosynovitis) Assessment & Plan: Seeing ortho.  S/p injection.  Planned f/u next week    Other orders -     Scopolamine; Place 1 patch (1.5 mg total) onto the skin every 3 (three) days.  Dispense: 10 patch; Refill: 0     Dale [place], MD

## 2022-05-18 NOTE — Patient Instructions (Signed)
B12 per day  (sublingual)

## 2022-05-21 ENCOUNTER — Encounter: Payer: Self-pay | Admitting: Internal Medicine

## 2022-05-21 DIAGNOSIS — M654 Radial styloid tenosynovitis [de Quervain]: Secondary | ICD-10-CM | POA: Insufficient documentation

## 2022-05-21 NOTE — Assessment & Plan Note (Signed)
Check B12 level. 

## 2022-05-21 NOTE — Assessment & Plan Note (Signed)
Had colonoscopy 07/2018 - redundant colon and diverticulosis.  F/u recommended in 5 years.   

## 2022-05-21 NOTE — Assessment & Plan Note (Signed)
Is s/p lap band surgery.  Had been on ozempic. Discussed diet and exercise. Plans to start exercising more.  Follow.

## 2022-05-21 NOTE — Assessment & Plan Note (Signed)
On thyroid replacement.  Follow tsh.  

## 2022-05-21 NOTE — Assessment & Plan Note (Signed)
Low carb diet and exercise.  Check met b and a1c.   

## 2022-05-21 NOTE — Assessment & Plan Note (Signed)
On prozac.  Appears to be doing well.  Follow.  

## 2022-05-21 NOTE — Assessment & Plan Note (Signed)
Persistent skin lesions - left leg. Refer to dermatology for evaluation and treatment.

## 2022-05-21 NOTE — Assessment & Plan Note (Signed)
Seeing ortho.  S/p injection.  Planned f/u next week

## 2022-05-21 NOTE — Assessment & Plan Note (Signed)
Low cholesterol diet and exercise.  Follow lipid panel.  Crestor.  

## 2022-05-21 NOTE — Assessment & Plan Note (Signed)
Off prednisone since 02/07/22.  Has been followed by rheumatology. Just evaluated as outlined.  Stable.  Follow.

## 2022-05-21 NOTE — Assessment & Plan Note (Signed)
On nexium.  Upper symptoms controlled.   

## 2022-05-24 DIAGNOSIS — M654 Radial styloid tenosynovitis [de Quervain]: Secondary | ICD-10-CM | POA: Diagnosis not present

## 2022-05-24 DIAGNOSIS — M19031 Primary osteoarthritis, right wrist: Secondary | ICD-10-CM | POA: Diagnosis not present

## 2022-06-20 ENCOUNTER — Telehealth: Payer: Self-pay | Admitting: Internal Medicine

## 2022-06-20 NOTE — Telephone Encounter (Signed)
Oral b12 1000 mcg q day?

## 2022-06-20 NOTE — Telephone Encounter (Signed)
Patient wants to know how many mcg's should she buy for her B12. She is suppose to get self dissolved tablets.

## 2022-06-20 NOTE — Telephone Encounter (Signed)
Yes

## 2022-06-21 NOTE — Telephone Encounter (Signed)
LMTCB

## 2022-06-21 NOTE — Telephone Encounter (Signed)
Patient aware. She says that she bought some OTC that was 3000 mcg. Is this ok for her to take? This is what she has been on with no issues. Just wanted to confirm with you before she bought another bottle.

## 2022-06-21 NOTE — Telephone Encounter (Signed)
Pt returned Trisha LPN call. Transferred. 

## 2022-06-21 NOTE — Telephone Encounter (Signed)
Pt aware.

## 2022-06-21 NOTE — Telephone Encounter (Signed)
Recommend oral B12 q day.

## 2022-06-23 NOTE — Telephone Encounter (Signed)
See my chart message

## 2022-07-03 DIAGNOSIS — M5033 Other cervical disc degeneration, cervicothoracic region: Secondary | ICD-10-CM | POA: Diagnosis not present

## 2022-07-03 DIAGNOSIS — M9902 Segmental and somatic dysfunction of thoracic region: Secondary | ICD-10-CM | POA: Diagnosis not present

## 2022-07-03 DIAGNOSIS — M62830 Muscle spasm of back: Secondary | ICD-10-CM | POA: Diagnosis not present

## 2022-07-03 DIAGNOSIS — M9901 Segmental and somatic dysfunction of cervical region: Secondary | ICD-10-CM | POA: Diagnosis not present

## 2022-09-26 ENCOUNTER — Ambulatory Visit: Payer: Medicare PPO | Admitting: Family Medicine

## 2022-10-09 ENCOUNTER — Telehealth: Payer: Self-pay | Admitting: Internal Medicine

## 2022-10-09 ENCOUNTER — Other Ambulatory Visit: Payer: Self-pay | Admitting: Internal Medicine

## 2022-10-09 DIAGNOSIS — Z1231 Encounter for screening mammogram for malignant neoplasm of breast: Secondary | ICD-10-CM

## 2022-10-09 NOTE — Telephone Encounter (Signed)
I received notification she is overdue mammogram.  Please schedule.

## 2022-10-09 NOTE — Telephone Encounter (Signed)
Patient aware of below. She is going to call and schedule. She did not want me to schedule.

## 2022-10-11 DIAGNOSIS — M170 Bilateral primary osteoarthritis of knee: Secondary | ICD-10-CM | POA: Diagnosis not present

## 2022-10-31 ENCOUNTER — Ambulatory Visit
Admission: RE | Admit: 2022-10-31 | Discharge: 2022-10-31 | Disposition: A | Discharge status: Home / Self Care | Payer: Medicare PPO | Source: Ambulatory Visit | Attending: Internal Medicine | Admitting: Internal Medicine

## 2022-10-31 DIAGNOSIS — Z1231 Encounter for screening mammogram for malignant neoplasm of breast: Secondary | ICD-10-CM | POA: Diagnosis not present

## 2022-11-08 DIAGNOSIS — M170 Bilateral primary osteoarthritis of knee: Secondary | ICD-10-CM | POA: Diagnosis not present

## 2022-11-08 DIAGNOSIS — M654 Radial styloid tenosynovitis [de Quervain]: Secondary | ICD-10-CM | POA: Diagnosis not present

## 2022-11-16 ENCOUNTER — Other Ambulatory Visit: Payer: Medicare PPO

## 2022-11-17 ENCOUNTER — Other Ambulatory Visit (INDEPENDENT_AMBULATORY_CARE_PROVIDER_SITE_OTHER): Payer: Medicare PPO

## 2022-11-17 DIAGNOSIS — E538 Deficiency of other specified B group vitamins: Secondary | ICD-10-CM

## 2022-11-17 DIAGNOSIS — E7800 Pure hypercholesterolemia, unspecified: Secondary | ICD-10-CM | POA: Diagnosis not present

## 2022-11-17 DIAGNOSIS — E039 Hypothyroidism, unspecified: Secondary | ICD-10-CM | POA: Diagnosis not present

## 2022-11-17 DIAGNOSIS — R739 Hyperglycemia, unspecified: Secondary | ICD-10-CM | POA: Diagnosis not present

## 2022-11-17 LAB — BASIC METABOLIC PANEL
BUN: 17 mg/dL (ref 6–23)
CO2: 33 meq/L — ABNORMAL HIGH (ref 19–32)
Calcium: 9.1 mg/dL (ref 8.4–10.5)
Chloride: 104 meq/L (ref 96–112)
Creatinine, Ser: 0.82 mg/dL (ref 0.40–1.20)
GFR: 66.81 mL/min (ref >60.00)
Glucose, Bld: 85 mg/dL (ref 70–99)
Potassium: 4 meq/L (ref 3.5–5.1)
Sodium: 141 meq/L (ref 135–145)

## 2022-11-17 LAB — HEPATIC FUNCTION PANEL
ALT: 9 U/L (ref 0–35)
AST: 16 U/L (ref 0–37)
Albumin: 3.6 g/dL (ref 3.5–5.2)
Alkaline Phosphatase: 58 U/L (ref 39–117)
Bilirubin, Direct: 0.1 mg/dL (ref 0.0–0.3)
Total Bilirubin: 0.7 mg/dL (ref 0.2–1.2)
Total Protein: 6.2 g/dL (ref 6.0–8.3)

## 2022-11-17 LAB — LIPID PANEL
Cholesterol: 128 mg/dL (ref 0–200)
HDL: 55.2 mg/dL (ref >39.00)
LDL Cholesterol: 64 mg/dL (ref 0–99)
NonHDL: 72.91
Total CHOL/HDL Ratio: 2
Triglycerides: 46 mg/dL (ref 0.0–149.0)
VLDL: 9.2 mg/dL (ref 0.0–40.0)

## 2022-11-17 LAB — TSH: TSH: 0.89 u[IU]/mL (ref 0.35–5.50)

## 2022-11-17 LAB — VITAMIN B12: Vitamin B-12: 625 pg/mL (ref 211–911)

## 2022-11-17 LAB — HEMOGLOBIN A1C: Hgb A1c MFr Bld: 5.7 % (ref 4.6–6.5)

## 2022-11-20 ENCOUNTER — Ambulatory Visit: Payer: Medicare PPO | Admitting: Internal Medicine

## 2022-11-20 VITALS — BP 118/70 | HR 77 | Temp 97.8°F | Resp 16 | Ht 65.0 in | Wt 249.0 lb

## 2022-11-20 DIAGNOSIS — R739 Hyperglycemia, unspecified: Secondary | ICD-10-CM

## 2022-11-20 DIAGNOSIS — K219 Gastro-esophageal reflux disease without esophagitis: Secondary | ICD-10-CM | POA: Diagnosis not present

## 2022-11-20 DIAGNOSIS — E039 Hypothyroidism, unspecified: Secondary | ICD-10-CM | POA: Diagnosis not present

## 2022-11-20 DIAGNOSIS — M25562 Pain in left knee: Secondary | ICD-10-CM

## 2022-11-20 DIAGNOSIS — F439 Reaction to severe stress, unspecified: Secondary | ICD-10-CM

## 2022-11-20 DIAGNOSIS — Z23 Encounter for immunization: Secondary | ICD-10-CM | POA: Diagnosis not present

## 2022-11-20 DIAGNOSIS — M353 Polymyalgia rheumatica: Secondary | ICD-10-CM

## 2022-11-20 DIAGNOSIS — Z0000 Encounter for general adult medical examination without abnormal findings: Secondary | ICD-10-CM

## 2022-11-20 DIAGNOSIS — M25561 Pain in right knee: Secondary | ICD-10-CM

## 2022-11-20 DIAGNOSIS — E7800 Pure hypercholesterolemia, unspecified: Secondary | ICD-10-CM | POA: Diagnosis not present

## 2022-11-20 DIAGNOSIS — Z860100 Personal history of colon polyps, unspecified: Secondary | ICD-10-CM

## 2022-11-20 DIAGNOSIS — M545 Low back pain, unspecified: Secondary | ICD-10-CM

## 2022-11-20 NOTE — Assessment & Plan Note (Signed)
Physical today 11/20/22.  Mammogram 10/31/22 - birads I. Colonoscopy 07/2018 - recommended f/u in 5 years.

## 2022-11-20 NOTE — Patient Instructions (Signed)
Pepcid (famotidine) - 20mg  - take one tablet before evening meal   Magnesium glycinate - one per day.

## 2022-11-20 NOTE — Progress Notes (Signed)
Subjective:    Patient ID: Jodi Harris, female    DOB: 1940-02-20, 82 y.o.   MRN: [id number]  Patient here for  Chief Complaint  Patient presents with   Annual Exam    HPI Here for a physical exam. Is being followed by ortho for her knees.  Planning for gel injections this week.  Standing - aggravated.  Some low back discomfort.  Notices more when first gets up. Once moving, back gets better. Has noticed some increased acid reflux.  Takes TUMS when occurs.  Appears to be occurring more frequently. Discussed avoiding foods that aggravate and avoiding eating late.  Trial of pepcid.  No chest pain reported.  Breathing stable.    Past Medical History:  Diagnosis Date   Anemia    Diverticulosis    GERD (gastroesophageal reflux disease)    History of colonic polyps    Hypercholesterolemia    Hypothyroidism    Internal hemorrhoids    Thrombophlebitis    varicosities   Past Surgical History:  Procedure Laterality Date   COLON SURGERY     laparoscopic assisted transverse colectomy for tubular adenomatous polyp   COLONOSCOPY     COLONOSCOPY WITH PROPOFOL N/A 07/29/2018   Procedure: COLONOSCOPY WITH PROPOFOL;  Surgeon: Christena Deem, MD;  Location: [facility] ENDOSCOPY;  Service: Endoscopy;  Laterality: N/A;   DIAGNOSTIC LAPAROSCOPY     LAPAROSCOPIC GASTRIC BANDING     URETHRAL DIVERTICULUM REPAIR  1974   Dr Jenne Pane   Family History  Problem Relation Age of Onset   Cancer Father        lymphoma   Cancer Maternal Aunt        Breast   Breast cancer Maternal Aunt 54   Colon cancer Other        one relative on maternal side   Social History   Socioeconomic History   Marital status: Married    Spouse name: Not on file   Number of children: 2   Years of education: Not on file   Highest education level: Not on file  Occupational History   Not on file  Tobacco Use   Smoking status: Never   Smokeless tobacco: Never   Tobacco comments:    smoked as a teenager  Vaping  Use   Vaping status: Never Used  Substance and Sexual Activity   Alcohol use: Not Currently    Alcohol/week: 0.0 standard drinks of alcohol    Comment: has a glass of wine about once a month   Drug use: No   Sexual activity: Never  Other Topics Concern   Not on file  Social History Narrative   Not on file   Social Determinants of Health   Financial Resource Strain: Low Risk  (03/02/2022)   Overall Financial Resource Strain (CARDIA)    Difficulty of Paying Living Expenses: Not hard at all  Food Insecurity: No Food Insecurity (02/23/2021)   Hunger Vital Sign    Worried About Running Out of Food in the Last Year: Never true    Ran Out of Food in the Last Year: Never true  Transportation Needs: No Transportation Needs (03/02/2022)   PRAPARE - Administrator, Civil Service (Medical): No    Lack of Transportation (Non-Medical): No  Physical Activity: Insufficiently Active (03/02/2022)   Exercise Vital Sign    Days of Exercise per Week: 5 days    Minutes of Exercise per Session: 10 min  Stress: No Stress Concern Present (03/02/2022)  [employer] of Occupational Health - Occupational Stress Questionnaire    Feeling of Stress : Not at all  Social Connections: Unknown (03/02/2022)   Social Connection and Isolation Panel [NHANES]    Frequency of Communication with Friends and Family: More than three times a week    Frequency of Social Gatherings with Friends and Family: More than three times a week    Attends Religious Services: Not on Marketing executive or Organizations: Not on file    Attends Banker Meetings: Not on file    Marital Status: Married     Review of Systems  Constitutional:  Negative for appetite change and unexpected weight change.  HENT:  Negative for congestion, sinus pressure and sore throat.   Eyes:  Negative for pain and visual disturbance.  Respiratory:  Negative for cough, chest tightness and shortness of breath.    Cardiovascular:  Negative for chest pain and palpitations.       No increased swelling.   Gastrointestinal:  Negative for abdominal pain, diarrhea, nausea and vomiting.       Acid reflux as outlined.   Genitourinary:  Negative for difficulty urinating and dysuria.  Musculoskeletal:  Positive for back pain.       Knee pain as outlined.   Skin:  Negative for color change and rash.  Neurological:  Negative for dizziness and headaches.  Hematological:  Negative for adenopathy. Does not bruise/bleed easily.  Psychiatric/Behavioral:  Negative for decreased concentration and dysphoric mood.        Objective:     BP 118/70   Pulse 77   Temp 97.8 F (36.6 C)   Resp 16   Ht 5\' 5"  (1.651 m)   Wt 249 lb (112.9 kg)   LMP 02/01/1993   SpO2 97%   BMI 41.44 kg/m  Wt Readings from Last 3 Encounters:  11/20/22 249 lb (112.9 kg)  05/18/22 250 lb 12.8 oz (113.8 kg)  03/07/22 251 lb 3.2 oz (113.9 kg)    Physical Exam Vitals reviewed.  Constitutional:      General: She is not in acute distress.    Appearance: Normal appearance. She is well-developed.  HENT:     Head: Normocephalic and atraumatic.     Right Ear: External ear normal.     Left Ear: External ear normal.  Eyes:     General: No scleral icterus.       Right eye: No discharge.        Left eye: No discharge.     Conjunctiva/sclera: Conjunctivae normal.  Neck:     Thyroid: No thyromegaly.  Cardiovascular:     Rate and Rhythm: Normal rate and regular rhythm.  Pulmonary:     Effort: No tachypnea, accessory muscle usage or respiratory distress.     Breath sounds: Normal breath sounds. No decreased breath sounds or wheezing.  Chest:  Breasts:    Right: No inverted nipple, mass, nipple discharge or tenderness (no axillary adenopathy).     Left: No inverted nipple, mass, nipple discharge or tenderness (no axilarry adenopathy).  Abdominal:     General: Bowel sounds are normal.     Palpations: Abdomen is soft.     Tenderness:  There is no abdominal tenderness.  Musculoskeletal:        General: No swelling or tenderness.     Cervical back: Neck supple.     Comments: No increased erythema.  No increased swelling.  Increased tenderness - popliteal region.  Lymphadenopathy:     Cervical: No cervical adenopathy.  Skin:    Findings: No erythema or rash.  Neurological:     Mental Status: She is alert and oriented to person, place, and time.  Psychiatric:        Mood and Affect: Mood normal.        Behavior: Behavior normal.      Outpatient Encounter Medications as of 11/20/2022  Medication Sig   aspirin EC 81 MG tablet Take 81 mg by mouth at bedtime as needed for mild pain. Swallow whole.   Cholecalciferol (VITAMIN D-3 PO) Take 2,000 Units by mouth daily.   cyanocobalamin (VITAMIN B12) 1000 MCG tablet Take 1,000 mcg by mouth daily.   fluticasone (FLONASE) 50 MCG/ACT nasal spray Place into both nostrils daily as needed.   levothyroxine (SYNTHROID) 112 MCG tablet Take 1 tablet (112 mcg total) by mouth daily.   rosuvastatin (CRESTOR) 10 MG tablet Take 1 tablet (10 mg total) by mouth daily.   [DISCONTINUED] alendronate (FOSAMAX) 70 MG tablet Take 70 mg by mouth once a week.   [DISCONTINUED] scopolamine (TRANSDERM-SCOP) 1 MG/3DAYS Place 1 patch (1.5 mg total) onto the skin every 3 (three) days.   No facility-administered encounter medications on file as of 11/20/2022.     Lab Results  Component Value Date   WBC 6.6 05/16/2021   HGB 12.8 05/16/2021   HCT 40.1 05/16/2021   PLT 229.0 05/16/2021   GLUCOSE 85 11/17/2022   CHOL 128 11/17/2022   TRIG 46.0 11/17/2022   HDL 55.20 11/17/2022   LDLDIRECT 129.8 02/15/2012   LDLCALC 64 11/17/2022   ALT 9 11/17/2022   AST 16 11/17/2022   NA 141 11/17/2022   K 4.0 11/17/2022   CL 104 11/17/2022   CREATININE 0.82 11/17/2022   BUN 17 11/17/2022   CO2 33 (H) 11/17/2022   TSH 0.89 11/17/2022   HGBA1C 5.7 11/17/2022    MM 3D SCREENING MAMMOGRAM BILATERAL  BREAST  Result Date: 11/02/2022 CLINICAL DATA:  Screening. EXAM: DIGITAL SCREENING BILATERAL MAMMOGRAM WITH TOMOSYNTHESIS AND CAD TECHNIQUE: Bilateral screening digital craniocaudal and mediolateral oblique mammograms were obtained. Bilateral screening digital breast tomosynthesis was performed. The images were evaluated with computer-aided detection. COMPARISON:  Previous exam(s). ACR Breast Density Category b: There are scattered areas of fibroglandular density. FINDINGS: There are no findings suspicious for malignancy. IMPRESSION: No mammographic evidence of malignancy. A result letter of this screening mammogram will be mailed directly to the patient. RECOMMENDATION: Screening mammogram in one year. (Code:SM-B-01Y) BI-RADS CATEGORY  1: Negative. Electronically Signed   By: Amie Portland M.D.   On: 11/02/2022 08:19       Assessment & Plan:  Routine general medical examination at a health care facility  Health care maintenance Assessment & Plan: Physical today 11/20/22.  Mammogram 10/31/22 - [facility] I. Colonoscopy 07/2018 - recommended f/u in 5 years.     Hyperglycemia Assessment & Plan: Low carb diet and exercise.  Check met b and a1c.   Orders: -     Hemoglobin A1c; Future  Hypercholesterolemia Assessment & Plan: Low cholesterol diet and exercise.  Follow lipid panel.  Crestor.   Orders: -     CBC with Differential/Platelet; Future -     Basic metabolic panel; Future -     Lipid panel; Future -     Hepatic function panel; Future  Need for influenza vaccination -     Flu Vaccine Trivalent High Dose (Fluad)  Stress Assessment & Plan: On prozac. Appears  to be doing well.  Follow.     Severe obesity (BMI >= 40) (HCC) Assessment & Plan: Is s/p lap band surgery.  Had been on ozempic. Have discussed diet and exercise. Follow.      PMR (polymyalgia rheumatica) (HCC) Assessment & Plan: Off prednisone since 02/07/22.  Has been followed by rheumatology. Stable.  Follow.     Hypothyroidism, unspecified type Assessment & Plan: On thyroid replacement.  Follow tsh.    History of colon polyps Assessment & Plan: Had colonoscopy 07/2018 - redundant colon and diverticulosis.  F/u recommended in 5 years.     Gastroesophageal reflux disease, unspecified whether esophagitis present Assessment & Plan: Off nexium.  Upper symptoms as outlined.  Increased acid reflux.  Trial of pepcid.    Pain in both knees, unspecified chronicity Assessment & Plan: Is being followed by ortho for her knees.  Planning for gel injections this week.    Right-sided low back pain without sciatica, unspecified chronicity Assessment & Plan: Does limit activity. Is better.  Stretches/exercise.  Follow.       Dale Tustin, MD

## 2022-11-23 DIAGNOSIS — M170 Bilateral primary osteoarthritis of knee: Secondary | ICD-10-CM | POA: Diagnosis not present

## 2022-11-26 ENCOUNTER — Encounter: Payer: Self-pay | Admitting: Internal Medicine

## 2022-11-26 NOTE — Assessment & Plan Note (Signed)
Had colonoscopy 07/2018 - redundant colon and diverticulosis.  F/u recommended in 5 years.   

## 2022-11-26 NOTE — Assessment & Plan Note (Addendum)
Off nexium.  Upper symptoms as outlined.  Increased acid reflux.  Trial of pepcid.

## 2022-11-26 NOTE — Assessment & Plan Note (Signed)
On thyroid replacement.  Follow tsh.  

## 2022-11-26 NOTE — Assessment & Plan Note (Signed)
Low cholesterol diet and exercise.  Follow lipid panel.  Crestor.

## 2022-11-26 NOTE — Assessment & Plan Note (Signed)
Is being followed by ortho for her knees.  Planning for gel injections this week.

## 2022-11-26 NOTE — Assessment & Plan Note (Signed)
Low carb diet and exercise.  Check met b and a1c.   

## 2022-11-26 NOTE — Assessment & Plan Note (Signed)
On prozac.  Appears to be doing well.  Follow.  

## 2022-11-26 NOTE — Assessment & Plan Note (Signed)
Is s/p lap band surgery.  Had been on ozempic. Have discussed diet and exercise. Follow.

## 2022-11-26 NOTE — Assessment & Plan Note (Signed)
Off prednisone since 02/07/22.  Has been followed by rheumatology. Stable.  Follow.

## 2022-11-26 NOTE — Assessment & Plan Note (Signed)
Does limit activity. Is better.  Stretches/exercise.  Follow.

## 2023-01-24 DIAGNOSIS — M353 Polymyalgia rheumatica: Secondary | ICD-10-CM | POA: Diagnosis not present

## 2023-01-24 DIAGNOSIS — Z7952 Long term (current) use of systemic steroids: Secondary | ICD-10-CM | POA: Diagnosis not present

## 2023-01-31 DIAGNOSIS — E7800 Pure hypercholesterolemia, unspecified: Secondary | ICD-10-CM | POA: Diagnosis not present

## 2023-03-06 ENCOUNTER — Ambulatory Visit (INDEPENDENT_AMBULATORY_CARE_PROVIDER_SITE_OTHER): Payer: Medicare PPO | Admitting: *Deleted

## 2023-03-06 VITALS — Ht 65.0 in | Wt 248.0 lb

## 2023-03-06 DIAGNOSIS — Z0000 Encounter for general adult medical examination without abnormal findings: Secondary | ICD-10-CM | POA: Diagnosis not present

## 2023-03-06 NOTE — Patient Instructions (Signed)
 Jodi Harris , Thank you for taking time to come for your Medicare Wellness Visit. I appreciate your ongoing commitment to your health goals. Please review the following plan we discussed and let me know if I can assist you in the future.   Referrals/Orders/Follow-Ups/Clinician Recommendations: None  This is a list of the screening recommended for you and due dates:  Health Maintenance  Topic Date Due   COVID-19 Vaccine (5 - 2024-25 season) 09/10/2022   Colon Cancer Screening  07/29/2023   Mammogram  10/31/2023   Medicare Annual Wellness Visit  03/05/2024   DTaP/Tdap/Td vaccine (3 - Td or Tdap) 09/02/2030   Pneumonia Vaccine  Completed   Flu Shot  Completed   DEXA scan (bone density measurement)  Completed   Zoster (Shingles) Vaccine  Completed   HPV Vaccine  Aged Out    Advanced directives: (Copy Requested) Please bring a copy of your health care power of attorney and living will to the office to be added to your chart at your convenience.  Next Medicare Annual Wellness Visit scheduled for next year: Yes 03/10/24 @ 2:20

## 2023-03-06 NOTE — Progress Notes (Signed)
 Subjective:   Jodi Harris is a 83 y.o. who presents for a Medicare Wellness preventive visit.  Visit Complete: Virtual I connected with  Jodi Harris on 03/06/23 by a audio enabled telemedicine application and verified that I am speaking with the correct person using two identifiers.  Patient Location: Home  Provider Location: Office/Clinic  I discussed the limitations of evaluation and management by telemedicine. The patient expressed understanding and agreed to proceed.  Vital Signs: Because this visit was a virtual/telehealth visit, some criteria may be missing or patient reported. Any vitals not documented were not able to be obtained and vitals that have been documented are patient reported.  VideoDeclined- This patient declined Librarian, academic. Therefore the visit was completed with audio only.  AWV Questionnaire: No: Patient Medicare AWV questionnaire was not completed prior to this visit.  Cardiac Risk Factors include: advanced age (>70men, >70 women);obesity (BMI >30kg/m2);dyslipidemia     Objective:    Today's Vitals   03/06/23 1341  Weight: 248 lb (112.5 kg)  Height: 5\' 5"  (1.651 m)   Body mass index is 41.27 kg/m.     03/06/2023    1:55 PM 03/02/2022    1:49 PM 02/23/2021    1:22 PM 02/02/2021    5:40 PM 02/23/2020    5:04 PM 02/20/2019    1:36 PM 07/29/2018   12:32 PM  Advanced Directives  Does Patient Have a Medical Advance Directive? Yes Yes Yes No Yes Yes Yes  Type of Estate agent of [place];Living will Healthcare Power of [place];Living will   Healthcare Power of [place];Living will Healthcare Power of [place];Living will Healthcare Power of [place];Living will  Does patient want to make changes to medical advance directive?  No - Patient declined No - Patient declined  No - Patient declined No - Patient declined   Copy of Healthcare Power of Attorney in Chart? No - copy requested No -  copy requested   No - copy requested No - copy requested No - copy requested  Would patient like information on creating a medical advance directive?    No - Patient declined       Current Medications (verified) Outpatient Encounter Medications as of 03/06/2023  Medication Sig   aspirin EC 81 MG tablet Take 81 mg by mouth at bedtime as needed for mild pain. Swallow whole.   Cholecalciferol (VITAMIN D-3 PO) Take 2,000 Units by mouth daily.   cyanocobalamin (VITAMIN B12) 1000 MCG tablet Take 1,000 mcg by mouth daily.   famotidine-calcium carbonate-magnesium hydroxide (PEPCID COMPLETE) 10-800-165 MG chewable tablet Chew 1 tablet by mouth daily.   fluticasone (FLONASE) 50 MCG/ACT nasal spray Place into both nostrils daily as needed.   levothyroxine (SYNTHROID) 112 MCG tablet Take 1 tablet (112 mcg total) by mouth daily.   rosuvastatin (CRESTOR) 10 MG tablet Take 1 tablet (10 mg total) by mouth daily.   No facility-administered encounter medications on file as of 03/06/2023.    Allergies (verified) Codeine sulfate and Penicillins   History: Past Medical History:  Diagnosis Date   Anemia    Diverticulosis    GERD (gastroesophageal reflux disease)    History of colonic polyps    Hypercholesterolemia    Hypothyroidism    Internal hemorrhoids    Thrombophlebitis    varicosities   Past Surgical History:  Procedure Laterality Date   COLON SURGERY     laparoscopic assisted transverse colectomy for tubular adenomatous polyp   COLONOSCOPY  COLONOSCOPY WITH PROPOFOL N/A 07/29/2018   Procedure: COLONOSCOPY WITH PROPOFOL;  Surgeon: Christena Deem, MD;  Location: [facility] ENDOSCOPY;  Service: Endoscopy;  Laterality: N/A;   DIAGNOSTIC LAPAROSCOPY     LAPAROSCOPIC GASTRIC BANDING     URETHRAL DIVERTICULUM REPAIR  1974   Dr Jenne Pane   Family History  Problem Relation Age of Onset   Cancer Father        lymphoma   Cancer Maternal Aunt        Breast   Breast cancer Maternal Aunt 68   Colon  cancer Other        one relative on maternal side   Social History   Socioeconomic History   Marital status: Married    Spouse name: Not on file   Number of children: 2   Years of education: Not on file   Highest education level: Not on file  Occupational History   Not on file  Tobacco Use   Smoking status: Never   Smokeless tobacco: Never   Tobacco comments:    smoked as a teenager  Vaping Use   Vaping status: Never Used  Substance and Sexual Activity   Alcohol use: Not Currently    Alcohol/week: 0.0 standard drinks of alcohol    Comment: has a glass of wine about once a month   Drug use: No   Sexual activity: Never  Other Topics Concern   Not on file  Social History Narrative   Married   Social Drivers of Health   Financial Resource Strain: Low Risk  (03/06/2023)   Overall Financial Resource Strain (CARDIA)    Difficulty of Paying Living Expenses: Not hard at all  Food Insecurity: No Food Insecurity (03/06/2023)   Hunger Vital Sign    Worried About Running Out of Food in the Last Year: Never true    Ran Out of Food in the Last Year: Never true  Transportation Needs: No Transportation Needs (03/06/2023)   PRAPARE - Administrator, Civil Service (Medical): No    Lack of Transportation (Non-Medical): No  Physical Activity: Inactive (03/06/2023)   Exercise Vital Sign    Days of Exercise per Week: 0 days    Minutes of Exercise per Session: 0 min  Stress: No Stress Concern Present (03/06/2023)   [employer] of Occupational Health - Occupational Stress Questionnaire    Feeling of Stress : Not at all  Social Connections: Socially Integrated (03/06/2023)   Social Connection and Isolation Panel [NHANES]    Frequency of Communication with Friends and Family: More than three times a week    Frequency of Social Gatherings with Friends and Family: More than three times a week    Attends Religious Services: More than 4 times per year    Active Member of [employer] or  Organizations: Yes    Attends Engineer, structural: More than 4 times per year    Marital Status: Married    Tobacco Counseling Counseling given: Not Answered Tobacco comments: smoked as a teenager    Clinical Intake:  Pre-visit preparation completed: Yes  Pain : No/denies pain     BMI - recorded: 41.27 Nutritional Status: BMI > 30  Obese Nutritional Risks: None Diabetes: No  How often do you need to have someone help you when you read instructions, pamphlets, or other written materials from your doctor or pharmacy?: 1 - Never  Interpreter Needed?: No  Information entered by :: R. Elizzie Westergard LPN   Activities of Daily Living  03/06/2023    1:42 PM  In your present state of health, do you have any difficulty performing the following activities:  Hearing? 0  Vision? 0  Difficulty concentrating or making decisions? 0  Walking or climbing stairs? 1  Dressing or bathing? 0  Doing errands, shopping? 0  Preparing Food and eating ? N  Using the Toilet? N  In the past six months, have you accidently leaked urine? Y  Do you have problems with loss of bowel control? N  Managing your Medications? N  Managing your Finances? N  Housekeeping or managing your Housekeeping? N    Patient Care Team: Dale Ahoskie, MD as PCP - General (Internal Medicine)  Indicate any recent Medical Services you may have received from other than [facility] providers in the past year (date may be approximate).     Assessment:   This is a routine wellness examination for Jodi Harris.  Hearing/Vision screen Hearing Screening - Comments:: No issues Vision Screening - Comments:: Glasses at times   Goals Addressed             This Visit's Progress    Patient Stated       Wants to lose some weight and work on her balance       Depression Screen     03/06/2023    1:48 PM 03/02/2022    1:47 PM 09/13/2021    2:57 PM 05/23/2021    3:45 PM 02/23/2021    1:21 PM 12/09/2020   12:03 PM 02/23/2020     1:45 PM  PHQ 2/9 Scores  PHQ - 2 Score 0 0 0 0 0 0 0  PHQ- 9 Score 0          Fall Risk     03/06/2023    1:44 PM 03/02/2022    1:52 PM 02/17/2022    2:06 PM 10/17/2021    1:13 PM 09/13/2021    2:56 PM  Fall Risk   Falls in the past year? 0 0 0 1 1  Number falls in past yr: 0 0 0 1 1  Injury with Fall? 0 0 0 1 1  Risk for fall due to : No Fall Risks  No Fall Risks Impaired balance/gait History of fall(s)  Follow up Falls prevention discussed;Falls evaluation completed Falls evaluation completed;Falls prevention discussed Falls evaluation completed Falls evaluation completed Falls evaluation completed    MEDICARE RISK AT HOME:  Medicare Risk at Home Any stairs in or around the home?: Yes If so, are there any without handrails?: No Home free of loose throw rugs in walkways, pet beds, electrical cords, etc?: Yes Adequate lighting in your home to reduce risk of falls?: Yes Life alert?: No Use of a cane, walker or w/c?: No Grab bars in the bathroom?: Yes Shower chair or bench in shower?: Yes Elevated toilet seat or a handicapped toilet?: Yes  TIMED UP AND GO:  Was the test performed?  No  Cognitive Function: 6CIT completed    02/15/2017    2:12 PM 01/29/2015   11:15 AM  MMSE - Mini Mental State Exam  Orientation to time 5 5  Orientation to Place 5 5  Registration 3 3  Attention/ Calculation 5 5  Recall 3 3  Language- name 2 objects 2 2  Language- repeat 1 1  Language- follow 3 step command 3 3  Language- read & follow direction 1 1  Write a sentence 1 1  Copy design 1 1  Total score 30 30  03/06/2023    1:55 PM 03/02/2022    1:54 PM 02/20/2019    1:44 PM 02/18/2018    2:08 PM 02/14/2016    1:24 PM  6CIT Screen  What Year? 0 points 0 points 0 points 0 points 0 points  What month? 0 points 0 points 0 points 0 points 0 points  What time? 0 points 0 points 0 points 0 points 0 points  Count back from 20 0 points 0 points 0 points 0 points 0 points  Months in  reverse 0 points 0 points 0 points 0 points 0 points  Repeat phrase 0 points 0 points 0 points 0 points 0 points  Total Score 0 points 0 points 0 points 0 points 0 points    Immunizations Immunization History  Administered Date(s) Administered   Fluad Quad(high Dose 65+) 10/27/2020   Fluad Trivalent(High Dose 65+) 11/20/2022   Influenza Split 12/03/2011, 11/12/2012, 11/13/2013   Influenza, High Dose Seasonal PF 11/23/2017   Influenza,inj,Quad PF,6+ Mos 12/06/2021   Influenza,inj,quad, With Preservative 11/17/2016, 10/23/2018   Influenza-Unspecified 11/14/2014, 11/04/2015, 11/28/2019   Moderna Covid-19 Vaccine Bivalent Booster 3yrs & up 11/22/2020   Moderna Sars-Covid-2 Vaccination 01/21/2019, 02/18/2019, 11/06/2019   Pneumococcal Conjugate-13 04/24/2013   Pneumococcal Polysaccharide-23 02/02/2012   Tdap 03/10/2010, 09/01/2020   Zoster Recombinant(Shingrix) 12/14/2017, 03/13/2018, 06/20/2018   Zoster, Live 07/01/2008    Screening Tests Health Maintenance  Topic Date Due   COVID-19 Vaccine (5 - 2024-25 season) 09/10/2022   Medicare Annual Wellness (AWV)  03/03/2023   Colonoscopy  07/29/2023   MAMMOGRAM  10/31/2023   DTaP/Tdap/Td (3 - Td or Tdap) 09/02/2030   Pneumonia Vaccine 49+ Years old  Completed   INFLUENZA VACCINE  Completed   DEXA SCAN  Completed   Zoster Vaccines- Shingrix  Completed   HPV VACCINES  Aged Out    Health Maintenance  Health Maintenance Due  Topic Date Due   COVID-19 Vaccine (5 - 2024-25 season) 09/10/2022   Medicare Annual Wellness (AWV)  03/03/2023   Health Maintenance Items Addressed: See Nurse Notes Patient declines bone density at this time and covid vaccine.  Additional Screening:  Vision Screening: Recommended annual ophthalmology exams for early detection of glaucoma and other disorders of the eye. Up to date. [facility]  Dental Screening: Recommended annual dental exams for proper oral hygiene  Community Resource Referral / Chronic  Care Management: CRR required this visit?  No   CCM required this visit?  No     Plan:     I have personally reviewed and noted the following in the patient's chart:   Medical and social history Use of alcohol, tobacco or illicit drugs  Current medications and supplements including opioid prescriptions. Patient is not currently taking opioid prescriptions. Functional ability and status Nutritional status Physical activity Advanced directives List of other physicians Hospitalizations, surgeries, and ER visits in previous 12 months Vitals Screenings to include cognitive, depression, and falls Referrals and appointments  In addition, I have reviewed and discussed with patient certain preventive protocols, quality metrics, and best practice recommendations. A written personalized care plan for preventive services as well as general preventive health recommendations were provided to patient.     Sydell Axon, LPN   04/17/8117   After Visit Summary: (MyChart) Due to this being a telephonic visit, the after visit summary with patients personalized plan was offered to patient via MyChart   Notes: Nothing significant to report at this time. Patient declines Bone density at this time

## 2023-03-15 ENCOUNTER — Other Ambulatory Visit: Payer: Medicare PPO

## 2023-03-16 ENCOUNTER — Other Ambulatory Visit (INDEPENDENT_AMBULATORY_CARE_PROVIDER_SITE_OTHER)

## 2023-03-16 DIAGNOSIS — R739 Hyperglycemia, unspecified: Secondary | ICD-10-CM

## 2023-03-16 DIAGNOSIS — E7800 Pure hypercholesterolemia, unspecified: Secondary | ICD-10-CM

## 2023-03-16 LAB — BASIC METABOLIC PANEL
BUN: 23 mg/dL (ref 6–23)
CO2: 29 meq/L (ref 19–32)
Calcium: 9.5 mg/dL (ref 8.4–10.5)
Chloride: 102 meq/L (ref 96–112)
Creatinine, Ser: 0.82 mg/dL (ref 0.40–1.20)
GFR: 66.66 mL/min (ref >60.00)
Glucose, Bld: 99 mg/dL (ref 70–99)
Potassium: 4.7 meq/L (ref 3.5–5.1)
Sodium: 139 meq/L (ref 135–145)

## 2023-03-16 LAB — LIPID PANEL
Cholesterol: 144 mg/dL (ref 0–200)
HDL: 58.5 mg/dL (ref >39.00)
LDL Cholesterol: 75 mg/dL (ref 0–99)
NonHDL: 85.79
Total CHOL/HDL Ratio: 2
Triglycerides: 56 mg/dL (ref 0.0–149.0)
VLDL: 11.2 mg/dL (ref 0.0–40.0)

## 2023-03-16 LAB — CBC WITH DIFFERENTIAL/PLATELET
Basophils Absolute: 0.1 10*3/uL (ref 0.0–0.1)
Basophils Relative: 1.2 % (ref 0.0–3.0)
Eosinophils Absolute: 0.1 10*3/uL (ref 0.0–0.7)
Eosinophils Relative: 3.3 % (ref 0.0–5.0)
HCT: 41.1 % (ref 36.0–46.0)
Hemoglobin: 13.2 g/dL (ref 12.0–15.0)
Lymphocytes Relative: 31.4 % (ref 12.0–46.0)
Lymphs Abs: 1.4 10*3/uL (ref 0.7–4.0)
MCHC: 32.2 g/dL (ref 30.0–36.0)
MCV: 90.6 fl (ref 78.0–100.0)
Monocytes Absolute: 0.4 10*3/uL (ref 0.1–1.0)
Monocytes Relative: 8.5 % (ref 3.0–12.0)
Neutro Abs: 2.4 10*3/uL (ref 1.4–7.7)
Neutrophils Relative %: 55.6 % (ref 43.0–77.0)
Platelets: 192 10*3/uL (ref 150.0–400.0)
RBC: 4.54 Mil/uL (ref 3.87–5.11)
RDW: 15 % (ref 11.5–15.5)
WBC: 4.3 10*3/uL (ref 4.0–10.5)

## 2023-03-16 LAB — HEPATIC FUNCTION PANEL
ALT: 10 U/L (ref 0–35)
AST: 17 U/L (ref 0–37)
Albumin: 3.9 g/dL (ref 3.5–5.2)
Alkaline Phosphatase: 63 U/L (ref 39–117)
Bilirubin, Direct: 0.2 mg/dL (ref 0.0–0.3)
Total Bilirubin: 0.6 mg/dL (ref 0.2–1.2)
Total Protein: 6.8 g/dL (ref 6.0–8.3)

## 2023-03-16 LAB — HEMOGLOBIN A1C: Hgb A1c MFr Bld: 5.8 % (ref 4.6–6.5)

## 2023-03-20 ENCOUNTER — Ambulatory Visit: Payer: Medicare PPO | Admitting: Internal Medicine

## 2023-03-20 ENCOUNTER — Encounter: Payer: Self-pay | Admitting: Internal Medicine

## 2023-03-20 VITALS — BP 114/68 | HR 76 | Temp 98.0°F | Resp 16 | Ht 65.0 in | Wt 254.2 lb

## 2023-03-20 DIAGNOSIS — R739 Hyperglycemia, unspecified: Secondary | ICD-10-CM | POA: Diagnosis not present

## 2023-03-20 DIAGNOSIS — E7800 Pure hypercholesterolemia, unspecified: Secondary | ICD-10-CM | POA: Diagnosis not present

## 2023-03-20 DIAGNOSIS — F439 Reaction to severe stress, unspecified: Secondary | ICD-10-CM | POA: Diagnosis not present

## 2023-03-20 DIAGNOSIS — Z860100 Personal history of colon polyps, unspecified: Secondary | ICD-10-CM | POA: Diagnosis not present

## 2023-03-20 DIAGNOSIS — E039 Hypothyroidism, unspecified: Secondary | ICD-10-CM

## 2023-03-20 DIAGNOSIS — M353 Polymyalgia rheumatica: Secondary | ICD-10-CM

## 2023-03-20 NOTE — Progress Notes (Signed)
 Subjective:    Patient ID: Jodi Harris, female    DOB: 1940-12-16, 83 y.o.   MRN: [id number]  Patient here for  Chief Complaint  Patient presents with   Medical Management of Chronic Issues    HPI Here for a scheduled follow up - follow up regarding hypercholesterolemia, hyperglycemia and was noted last visit to have increased problems with reflux. Started on pepcid. Seeing ortho for her knees. S/p monovisc injection 11/23/22. Had f/u with Dr Allena Katz 01/24/23 - f/u PMR. Stable. Off prednisone. F/u cardiology 1/22/5 - stable. No recurrence of syncope. Stable ECHO. Breathing stable. More alert. Feels more energetic.    Past Medical History:  Diagnosis Date   Anemia    Diverticulosis    GERD (gastroesophageal reflux disease)    History of colonic polyps    Hypercholesterolemia    Hypothyroidism    Internal hemorrhoids    Thrombophlebitis    varicosities   Past Surgical History:  Procedure Laterality Date   COLON SURGERY     laparoscopic assisted transverse colectomy for tubular adenomatous polyp   COLONOSCOPY     COLONOSCOPY WITH PROPOFOL N/A 07/29/2018   Procedure: COLONOSCOPY WITH PROPOFOL;  Surgeon: Christena Deem, MD;  Location: [facility] ENDOSCOPY;  Service: Endoscopy;  Laterality: N/A;   DIAGNOSTIC LAPAROSCOPY     LAPAROSCOPIC GASTRIC BANDING     URETHRAL DIVERTICULUM REPAIR  1974   Dr Jenne Pane   Family History  Problem Relation Age of Onset   Cancer Father        lymphoma   Cancer Maternal Aunt        Breast   Breast cancer Maternal Aunt 72   Colon cancer Other        one relative on maternal side   Social History   Socioeconomic History   Marital status: Married    Spouse name: Not on file   Number of children: 2   Years of education: Not on file   Highest education level: Not on file  Occupational History   Not on file  Tobacco Use   Smoking status: Never   Smokeless tobacco: Never   Tobacco comments:    smoked as a teenager  Vaping Use   Vaping  status: Never Used  Substance and Sexual Activity   Alcohol use: Not Currently    Alcohol/week: 0.0 standard drinks of alcohol    Comment: has a glass of wine about once a month   Drug use: No   Sexual activity: Never  Other Topics Concern   Not on file  Social History Narrative   Married   Social Drivers of Health   Financial Resource Strain: Low Risk  (03/06/2023)   Overall Financial Resource Strain (CARDIA)    Difficulty of Paying Living Expenses: Not hard at all  Food Insecurity: No Food Insecurity (03/06/2023)   Hunger Vital Sign    Worried About Running Out of Food in the Last Year: Never true    Ran Out of Food in the Last Year: Never true  Transportation Needs: No Transportation Needs (03/06/2023)   PRAPARE - Administrator, Civil Service (Medical): No    Lack of Transportation (Non-Medical): No  Physical Activity: Inactive (03/06/2023)   Exercise Vital Sign    Days of Exercise per Week: 0 days    Minutes of Exercise per Session: 0 min  Stress: No Stress Concern Present (03/06/2023)   [employer] of Occupational Health - Occupational Stress Questionnaire    Feeling  of Stress : Not at all  Social Connections: Socially Integrated (03/06/2023)   Social Connection and Isolation Panel [NHANES]    Frequency of Communication with Friends and Family: More than three times a week    Frequency of Social Gatherings with Friends and Family: More than three times a week    Attends Religious Services: More than 4 times per year    Active Member of [employer] or Organizations: Yes    Attends Engineer, structural: More than 4 times per year    Marital Status: Married     Review of Systems  Constitutional:  Negative for appetite change and fever.  HENT:  Negative for congestion and sinus pressure.   Respiratory:  Negative for cough and chest tightness.        Breathing stable.   Cardiovascular:  Negative for chest pain and palpitations.  Gastrointestinal:   Negative for abdominal pain, diarrhea, nausea and vomiting.  Genitourinary:  Negative for difficulty urinating and dysuria.  Musculoskeletal:  Negative for myalgias.       Knee issues as outlined.   Skin:  Negative for color change and rash.  Neurological:  Negative for dizziness and headaches.  Psychiatric/Behavioral:  Negative for agitation and dysphoric mood.        Objective:     BP 114/68   Pulse 76   Temp 98 F (36.7 C)   Resp 16   Ht 5\' 5"  (1.651 m)   Wt 254 lb 3.2 oz (115.3 kg)   LMP 02/01/1993   SpO2 98%   BMI 42.30 kg/m  Wt Readings from Last 3 Encounters:  03/20/23 254 lb 3.2 oz (115.3 kg)  03/06/23 248 lb (112.5 kg)  11/20/22 249 lb (112.9 kg)    Physical Exam Vitals reviewed.  Constitutional:      General: She is not in acute distress.    Appearance: Normal appearance.  HENT:     Head: Normocephalic and atraumatic.     Right Ear: External ear normal.     Left Ear: External ear normal.     Mouth/Throat:     Pharynx: No oropharyngeal exudate or posterior oropharyngeal erythema.  Eyes:     General: No scleral icterus.       Right eye: No discharge.        Left eye: No discharge.     Conjunctiva/sclera: Conjunctivae normal.  Neck:     Thyroid: No thyromegaly.  Cardiovascular:     Rate and Rhythm: Normal rate and regular rhythm.  Pulmonary:     Effort: No respiratory distress.     Breath sounds: Normal breath sounds. No wheezing.  Abdominal:     General: Bowel sounds are normal.     Palpations: Abdomen is soft.     Tenderness: There is no abdominal tenderness.  Musculoskeletal:        General: No swelling or tenderness.     Cervical back: Neck supple. No tenderness.  Lymphadenopathy:     Cervical: No cervical adenopathy.  Skin:    Findings: No erythema or rash.  Neurological:     Mental Status: She is alert.  Psychiatric:        Mood and Affect: Mood normal.        Behavior: Behavior normal.         Outpatient Encounter Medications as  of 03/20/2023  Medication Sig   aspirin EC 81 MG tablet Take 81 mg by mouth at bedtime as needed for mild pain. Swallow whole.  Cholecalciferol (VITAMIN D-3 PO) Take 2,000 Units by mouth daily.   cyanocobalamin (VITAMIN B12) 1000 MCG tablet Take 1,000 mcg by mouth daily.   famotidine-calcium carbonate-magnesium hydroxide (PEPCID COMPLETE) 10-800-165 MG chewable tablet Chew 1 tablet by mouth daily.   fluticasone (FLONASE) 50 MCG/ACT nasal spray Place into both nostrils daily as needed.   levothyroxine (SYNTHROID) 112 MCG tablet Take 1 tablet (112 mcg total) by mouth daily.   rosuvastatin (CRESTOR) 10 MG tablet Take 1 tablet (10 mg total) by mouth daily.   No facility-administered encounter medications on file as of 03/20/2023.     Lab Results  Component Value Date   WBC 4.3 03/16/2023   HGB 13.2 03/16/2023   HCT 41.1 03/16/2023   PLT 192.0 03/16/2023   GLUCOSE 99 03/16/2023   CHOL 144 03/16/2023   TRIG 56.0 03/16/2023   HDL 58.50 03/16/2023   LDLDIRECT 129.8 02/15/2012   LDLCALC 75 03/16/2023   ALT 10 03/16/2023   AST 17 03/16/2023   NA 139 03/16/2023   K 4.7 03/16/2023   CL 102 03/16/2023   CREATININE 0.82 03/16/2023   BUN 23 03/16/2023   CO2 29 03/16/2023   TSH 0.71 03/20/2023   HGBA1C 5.8 03/16/2023    MM 3D SCREENING MAMMOGRAM BILATERAL BREAST Result Date: 11/02/2022 CLINICAL DATA:  Screening. EXAM: DIGITAL SCREENING BILATERAL MAMMOGRAM WITH TOMOSYNTHESIS AND CAD TECHNIQUE: Bilateral screening digital craniocaudal and mediolateral oblique mammograms were obtained. Bilateral screening digital breast tomosynthesis was performed. The images were evaluated with computer-aided detection. COMPARISON:  Previous exam(s). ACR Breast Density Category b: There are scattered areas of fibroglandular density. FINDINGS: There are no findings suspicious for malignancy. IMPRESSION: No mammographic evidence of malignancy. A result letter of this screening mammogram will be mailed directly to  the patient. RECOMMENDATION: Screening mammogram in one year. (Code:SM-B-01Y) BI-RADS CATEGORY  1: Negative. Electronically Signed   By: Amie Portland M.D.   On: 11/02/2022 08:19       Assessment & Plan:  Hypothyroidism, unspecified type Assessment & Plan: On thyroid replacement. Follow tsh.   Orders: -     TSH -     TSH; Future  Hypercholesterolemia Assessment & Plan: Low cholesterol diet and exercise.  Follow lipid panel.  Continue crestor.  Lab Results  Component Value Date   CHOL 144 03/16/2023   HDL 58.50 03/16/2023   LDLCALC 75 03/16/2023   LDLDIRECT 129.8 02/15/2012   TRIG 56.0 03/16/2023   CHOLHDL 2 03/16/2023      Orders: -     Basic metabolic panel; Future -     Lipid panel; Future -     Hepatic function panel; Future  Hyperglycemia Assessment & Plan: Low carb diet and exercise. Follow met b and A1c.  Lab Results  Component Value Date   HGBA1C 5.8 03/16/2023     Orders: -     Hemoglobin A1c; Future  Stress Assessment & Plan: On prozac. Appears to be doing well.  Follow.     Severe obesity (BMI >= 40) (HCC) Assessment & Plan: Is s/p lap band surgery.  Had been on ozempic. Have discussed diet and exercise. Follow.    PMR (polymyalgia rheumatica) [facility]) Assessment & Plan: Seeing Dr Allena Katz. Stable. Off prednisone.    History of colon polyps Assessment & Plan: Had colonoscopy 07/2018 - redundant colon and diverticulosis.  F/u recommended in 5 years.        Dale , MD

## 2023-03-21 LAB — TSH: TSH: 0.71 u[IU]/mL (ref 0.35–5.50)

## 2023-03-22 ENCOUNTER — Encounter: Payer: Self-pay | Admitting: Internal Medicine

## 2023-03-25 ENCOUNTER — Encounter: Payer: Self-pay | Admitting: Internal Medicine

## 2023-03-25 NOTE — Assessment & Plan Note (Signed)
 On thyroid replacement.  Follow tsh.

## 2023-03-25 NOTE — Assessment & Plan Note (Signed)
 Low carb diet and exercise. Follow met b and A1c.  Lab Results  Component Value Date   HGBA1C 5.8 03/16/2023

## 2023-03-25 NOTE — Assessment & Plan Note (Signed)
Had colonoscopy 07/2018 - redundant colon and diverticulosis.  F/u recommended in 5 years.   

## 2023-03-25 NOTE — Assessment & Plan Note (Signed)
 Is s/p lap band surgery.  Had been on ozempic. Have discussed diet and exercise. Follow.

## 2023-03-25 NOTE — Assessment & Plan Note (Signed)
On prozac.  Appears to be doing well.  Follow.  

## 2023-03-25 NOTE — Assessment & Plan Note (Signed)
 Seeing Dr Allena Katz. Stable. Off prednisone.

## 2023-03-25 NOTE — Assessment & Plan Note (Signed)
 Low cholesterol diet and exercise.  Follow lipid panel.  Continue crestor.  Lab Results  Component Value Date   CHOL 144 03/16/2023   HDL 58.50 03/16/2023   LDLCALC 75 03/16/2023   LDLDIRECT 129.8 02/15/2012   TRIG 56.0 03/16/2023   CHOLHDL 2 03/16/2023

## 2023-03-28 ENCOUNTER — Encounter: Payer: Self-pay | Admitting: Internal Medicine

## 2023-03-29 NOTE — Telephone Encounter (Signed)
 Zepbound 2.5mg  q week.

## 2023-03-29 NOTE — Telephone Encounter (Signed)
 She has low back pain and knee pain. It is documented in her history. Do I need to complete a form or who do we contact to see what is needed to get medication authorized.

## 2023-03-30 ENCOUNTER — Telehealth: Payer: Self-pay

## 2023-03-30 ENCOUNTER — Other Ambulatory Visit: Payer: Self-pay | Admitting: Internal Medicine

## 2023-03-30 ENCOUNTER — Other Ambulatory Visit (HOSPITAL_COMMUNITY): Payer: Self-pay

## 2023-03-30 NOTE — Telephone Encounter (Signed)
 Patient needs PA on zepbound 2.5

## 2023-03-30 NOTE — Telephone Encounter (Signed)
 Pharmacy Patient Advocate Encounter   Received notification from Patient Advice Request messages that prior authorization for Zepbound 2.5MG /0.5ML pen-injectors is required/requested.   Insurance verification completed.   The patient is insured through [employer] .   Per test claim: PA required; PA submitted to above mentioned insurance via CoverMyMeds Key/confirmation #/EOC [id number] Status is pending

## 2023-04-02 NOTE — Telephone Encounter (Signed)
 Pharmacy Patient Advocate Encounter  Received notification from [facility] that Prior Authorization for Zepbound 2.5MG /0.5ML pen-injectors  has been DENIED.  Full denial letter will be uploaded to the media tab. See denial reason below.   PA #/Case ID/Reference #:  [id number]   DENIAL REASON: Plan exclusion

## 2023-04-02 NOTE — Telephone Encounter (Signed)
 LMTCB. Please relay message.

## 2023-04-03 NOTE — Telephone Encounter (Signed)
 PATIENT IS AWARE

## 2023-04-03 NOTE — Telephone Encounter (Signed)
 [facility] to refill

## 2023-04-04 NOTE — Telephone Encounter (Signed)
 Rx ok'd for flonase.

## 2023-04-16 DIAGNOSIS — H26493 Other secondary cataract, bilateral: Secondary | ICD-10-CM | POA: Diagnosis not present

## 2023-04-16 DIAGNOSIS — H18593 Other hereditary corneal dystrophies, bilateral: Secondary | ICD-10-CM | POA: Diagnosis not present

## 2023-04-24 ENCOUNTER — Other Ambulatory Visit: Payer: Self-pay | Admitting: Internal Medicine

## 2023-04-24 DIAGNOSIS — E7800 Pure hypercholesterolemia, unspecified: Secondary | ICD-10-CM

## 2023-06-11 DIAGNOSIS — H18593 Other hereditary corneal dystrophies, bilateral: Secondary | ICD-10-CM | POA: Diagnosis not present

## 2023-06-11 DIAGNOSIS — H26493 Other secondary cataract, bilateral: Secondary | ICD-10-CM | POA: Diagnosis not present

## 2023-07-03 DIAGNOSIS — Z6841 Body Mass Index (BMI) 40.0 and over, adult: Secondary | ICD-10-CM | POA: Diagnosis not present

## 2023-07-03 DIAGNOSIS — Z860101 Personal history of adenomatous and serrated colon polyps: Secondary | ICD-10-CM | POA: Diagnosis not present

## 2023-07-18 ENCOUNTER — Other Ambulatory Visit

## 2023-07-20 ENCOUNTER — Ambulatory Visit: Admitting: Internal Medicine

## 2023-07-23 ENCOUNTER — Ambulatory Visit: Admitting: Internal Medicine

## 2023-08-03 ENCOUNTER — Other Ambulatory Visit

## 2023-08-04 ENCOUNTER — Other Ambulatory Visit: Payer: Self-pay | Admitting: Internal Medicine

## 2023-08-04 DIAGNOSIS — E039 Hypothyroidism, unspecified: Secondary | ICD-10-CM

## 2023-08-06 ENCOUNTER — Telehealth: Payer: Self-pay

## 2023-08-06 NOTE — Telephone Encounter (Signed)
 Patient says last Friday (7/25) she started having congestion in her nose and throat, sinus pressure. No cough, fever, chills, body aches, SOB, chest tightness, etc. She is scheduled for labs 7/29 and appt with you on 8/1. She is wondering if you would be agreeable to send something in for her. If not, any OTC recommendations to try until her appt. She is using flonase  right now.

## 2023-08-06 NOTE — Telephone Encounter (Signed)
 LM for patient. Please relay message below when she returns call.

## 2023-08-06 NOTE — Telephone Encounter (Unsigned)
 Copied from CRM [id number]. Topic: General - Other >> Aug 06, 2023  1:40 PM Thersia C wrote: Reason for CRM: Patient called in regarding a missed call from nurse, relay message , patient stated she will try this

## 2023-08-06 NOTE — Telephone Encounter (Signed)
 Noted

## 2023-08-06 NOTE — Telephone Encounter (Signed)
 Copied from CRM [id number]. Topic: Clinical - Medical Advice >> Aug 06, 2023  9:35 AM Martinique E wrote: Reason for CRM: Patient called in stating that she started having some congestion and headaches over the weekend and would like to know if PCP can send in a z-pak for her. Callback number [phone].

## 2023-08-06 NOTE — Telephone Encounter (Signed)
 Recommendation, given symptoms just started a few days ago, it is recommended to treat symptoms and hold abx.  Continue flonase  - 2 sprays each nostril one time per day.  Saline nasal spray - flush nose 1-2 x/day.  Confirm no allergy or problems taking mucinex. If no, then mucinex for congestion.  Keep us  posted and let  us  know if any worsening problems.  Also, see if she has tested for covid.

## 2023-08-07 ENCOUNTER — Other Ambulatory Visit (INDEPENDENT_AMBULATORY_CARE_PROVIDER_SITE_OTHER)

## 2023-08-07 DIAGNOSIS — E039 Hypothyroidism, unspecified: Secondary | ICD-10-CM

## 2023-08-07 DIAGNOSIS — R739 Hyperglycemia, unspecified: Secondary | ICD-10-CM | POA: Diagnosis not present

## 2023-08-07 DIAGNOSIS — E7800 Pure hypercholesterolemia, unspecified: Secondary | ICD-10-CM

## 2023-08-07 NOTE — Addendum Note (Signed)
 Addended by: MARYLEN PRO A on: 08/07/2023 10:00 AM   Modules accepted: Orders

## 2023-08-08 ENCOUNTER — Ambulatory Visit: Payer: Self-pay | Admitting: Internal Medicine

## 2023-08-08 LAB — LIPID PANEL
Cholesterol: 141 mg/dL (ref <200)
HDL: 55 mg/dL (ref >=50)
LDL Cholesterol (Calc): 72 mg/dL
Non-HDL Cholesterol (Calc): 86 mg/dL (ref <130)
Total CHOL/HDL Ratio: 2.6 (calc) (ref <5.0)
Triglycerides: 65 mg/dL (ref <150)

## 2023-08-08 LAB — HEPATIC FUNCTION PANEL
AG Ratio: 1.7 (calc) (ref 1.0–2.5)
ALT: 9 U/L (ref 6–29)
AST: 16 U/L (ref 10–35)
Albumin: 3.8 g/dL (ref 3.6–5.1)
Alkaline phosphatase (APISO): 62 U/L (ref 37–153)
Bilirubin, Direct: 0.1 mg/dL (ref 0.0–0.2)
Globulin: 2.2 g/dL (ref 1.9–3.7)
Indirect Bilirubin: 0.5 mg/dL (ref 0.2–1.2)
Total Bilirubin: 0.6 mg/dL (ref 0.2–1.2)
Total Protein: 6 g/dL — ABNORMAL LOW (ref 6.1–8.1)

## 2023-08-08 LAB — BASIC METABOLIC PANEL WITH GFR
BUN: 18 mg/dL (ref 7–25)
CO2: 32 mmol/L (ref 20–32)
Calcium: 9.6 mg/dL (ref 8.6–10.4)
Chloride: 103 mmol/L (ref 98–110)
Creat: 0.86 mg/dL (ref 0.60–0.95)
Glucose, Bld: 94 mg/dL (ref 65–99)
Potassium: 4.4 mmol/L (ref 3.5–5.3)
Sodium: 142 mmol/L (ref 135–146)
eGFR: 67 mL/min/1.73m2 (ref >=60)

## 2023-08-08 LAB — HEMOGLOBIN A1C
Hgb A1c MFr Bld: 5.7 % — ABNORMAL HIGH (ref <5.7)
Mean Plasma Glucose: 117 mg/dL
eAG (mmol/L): 6.5 mmol/L

## 2023-08-08 LAB — TSH: TSH: 0.99 m[IU]/L (ref 0.40–4.50)

## 2023-08-10 ENCOUNTER — Encounter: Payer: Self-pay | Admitting: Internal Medicine

## 2023-08-10 ENCOUNTER — Ambulatory Visit: Admitting: Internal Medicine

## 2023-08-10 VITALS — BP 108/66 | HR 84 | Resp 16 | Ht 65.0 in | Wt 257.4 lb

## 2023-08-10 DIAGNOSIS — R739 Hyperglycemia, unspecified: Secondary | ICD-10-CM

## 2023-08-10 DIAGNOSIS — M25562 Pain in left knee: Secondary | ICD-10-CM

## 2023-08-10 DIAGNOSIS — M25561 Pain in right knee: Secondary | ICD-10-CM | POA: Diagnosis not present

## 2023-08-10 DIAGNOSIS — F439 Reaction to severe stress, unspecified: Secondary | ICD-10-CM

## 2023-08-10 DIAGNOSIS — R400 Somnolence: Secondary | ICD-10-CM | POA: Diagnosis not present

## 2023-08-10 DIAGNOSIS — E7800 Pure hypercholesterolemia, unspecified: Secondary | ICD-10-CM

## 2023-08-10 DIAGNOSIS — K219 Gastro-esophageal reflux disease without esophagitis: Secondary | ICD-10-CM

## 2023-08-10 DIAGNOSIS — E039 Hypothyroidism, unspecified: Secondary | ICD-10-CM

## 2023-08-10 DIAGNOSIS — Z860100 Personal history of colon polyps, unspecified: Secondary | ICD-10-CM | POA: Diagnosis not present

## 2023-08-10 DIAGNOSIS — R0981 Nasal congestion: Secondary | ICD-10-CM

## 2023-08-10 DIAGNOSIS — M353 Polymyalgia rheumatica: Secondary | ICD-10-CM

## 2023-08-10 MED ORDER — ROSUVASTATIN CALCIUM 10 MG PO TABS: 10.0000 mg | ORAL_TABLET | Freq: Every day | ORAL | 3 refills | Status: AC

## 2023-08-10 NOTE — Progress Notes (Signed)
 Subjective:    Patient ID: Jodi Harris, female    DOB: [birth year], 83 y.o.   MRN: [id number]  Patient here for  Chief Complaint  Patient presents with   Medical Management of Chronic Issues    HPI Here for a scheduled follow up - follow up regarding hypercholesterolemia and hyperglycemia. Seeing ortho for her knees. S/p monovisc injection 11/23/22. Had f/u with Dr Tobie 01/24/23 - f/u PMR. Stable. Off prednisone . F/u cardiology 1/22/5 - stable. Stable ECHO. Saw GI 07/03/23 - recommended check hemoccult cards, ferritin and iron panel. Noticed over this past week, increased sinus stuffiness, congestion - nose and throat. Reported some light headedness. Dull headache. Felt a little unsteady. Called in 7/28 - was instructed to use flonase , saline nasal spray and mucinex. Is doing better. Discussed dull headache. Also, discussed possible sleep apnea. Does not feel rested when wakes up. Wants to sleep all of the time. No chest pain or sob reported. No abdominal pain reported.    Past Medical History:  Diagnosis Date   Anemia    Diverticulosis    GERD (gastroesophageal reflux disease)    History of colonic polyps    Hypercholesterolemia    Hypothyroidism    Internal hemorrhoids    Thrombophlebitis    varicosities   Past Surgical History:  Procedure Laterality Date   COLON SURGERY     laparoscopic assisted transverse colectomy for tubular adenomatous polyp   COLONOSCOPY     COLONOSCOPY WITH PROPOFOL  N/A 07/29/2018   Procedure: COLONOSCOPY WITH PROPOFOL ;  Surgeon: Gaylyn Gladis PENNER, MD;  Location: [facility] ENDOSCOPY;  Service: Endoscopy;  Laterality: N/A;   DIAGNOSTIC LAPAROSCOPY     LAPAROSCOPIC GASTRIC BANDING     URETHRAL DIVERTICULUM REPAIR  1974   Dr Carlie   Family History  Problem Relation Age of Onset   Cancer Father        lymphoma   Cancer Maternal Aunt        Breast   Breast cancer Maternal Aunt 23   Colon cancer Other        one relative on maternal side   Social  History   Socioeconomic History   Marital status: Married    Spouse name: Not on file   Number of children: 2   Years of education: Not on file   Highest education level: Not on file  Occupational History   Not on file  Tobacco Use   Smoking status: Never   Smokeless tobacco: Never   Tobacco comments:    smoked as a teenager  Vaping Use   Vaping status: Never Used  Substance and Sexual Activity   Alcohol use: Not Currently    Alcohol/week: 0.0 standard drinks of alcohol    Comment: has a glass of wine about once a month   Drug use: No   Sexual activity: Never  Other Topics Concern   Not on file  Social History Narrative   Married   Social Drivers of Health   Financial Resource Strain: Low Risk  (07/03/2023)   Received from [facility]   Overall Financial Resource Strain (CARDIA)    Difficulty of Paying Living Expenses: Not hard at all  Food Insecurity: No Food Insecurity (07/03/2023)   Received from [facility]   Hunger Vital Sign    Within the past 12 months, you worried that your food would run out before you got the money to buy more.: Never true    Within the past  12 months, the food you bought just didn't last and you didn't have money to get more.: Never true  Transportation Needs: No Transportation Needs (07/03/2023)   Received from [facility] - Transportation    In the past 12 months, has lack of transportation kept you from medical appointments or from getting medications?: No    Lack of Transportation (Non-Medical): No  Physical Activity: Inactive (03/06/2023)   Exercise Vital Sign    Days of Exercise per Week: 0 days    Minutes of Exercise per Session: 0 min  Stress: No Stress Concern Present (03/06/2023)   [employer] of Occupational Health - Occupational Stress Questionnaire    Feeling of Stress : Not at all  Social Connections: Socially Integrated (03/06/2023)   Social Connection and  Isolation Panel    Frequency of Communication with Friends and Family: More than three times a week    Frequency of Social Gatherings with Friends and Family: More than three times a week    Attends Religious Services: More than 4 times per year    Active Member of [employer] or Organizations: Yes    Attends Engineer, structural: More than 4 times per year    Marital Status: Married     Review of Systems  Constitutional:  Positive for fatigue. Negative for appetite change and unexpected weight change.  HENT:  Positive for congestion and postnasal drip.   Respiratory:  Negative for cough, chest tightness and shortness of breath.   Cardiovascular:  Negative for chest pain and palpitations.       No increased swelling.   Gastrointestinal:  Negative for abdominal pain, diarrhea, nausea and vomiting.  Genitourinary:  Negative for difficulty urinating and dysuria.  Musculoskeletal:  Negative for joint swelling and myalgias.  Skin:  Negative for color change and rash.  Neurological:  Positive for headaches.       Reported some unsteadiness.   Psychiatric/Behavioral:  Negative for agitation and dysphoric mood.        Objective:     BP 108/66   Pulse 84   Resp 16   Ht 5' 5 (1.651 m)   Wt 257 lb 6.4 oz (116.8 kg)   LMP 02/01/1993   SpO2 98%   BMI 42.83 kg/m  Wt Readings from Last 3 Encounters:  08/10/23 257 lb 6.4 oz (116.8 kg)  03/20/23 254 lb 3.2 oz (115.3 kg)  03/06/23 248 lb (112.5 kg)    Physical Exam Vitals reviewed.  Constitutional:      General: She is not in acute distress.    Appearance: Normal appearance.  HENT:     Head: Normocephalic and atraumatic.     Right Ear: External ear normal.     Left Ear: External ear normal.     Mouth/Throat:     Pharynx: No oropharyngeal exudate or posterior oropharyngeal erythema.  Eyes:     General: No scleral icterus.       Right eye: No discharge.        Left eye: No discharge.     Conjunctiva/sclera: Conjunctivae  normal.  Neck:     Thyroid : No thyromegaly.  Cardiovascular:     Rate and Rhythm: Normal rate and regular rhythm.  Pulmonary:     Effort: No respiratory distress.     Breath sounds: Normal breath sounds. No wheezing.  Abdominal:     General: Bowel sounds are normal.     Palpations: Abdomen is soft.  Tenderness: There is no abdominal tenderness.  Musculoskeletal:        General: No tenderness.     Cervical back: Neck supple. No tenderness.     Comments: No increased swelling.   Lymphadenopathy:     Cervical: No cervical adenopathy.  Skin:    Findings: No erythema or rash.  Neurological:     Mental Status: She is alert.  Psychiatric:        Mood and Affect: Mood normal.        Behavior: Behavior normal.         Outpatient Encounter Medications as of 08/10/2023  Medication Sig   aspirin EC 81 MG tablet Take 81 mg by mouth at bedtime as needed for mild pain. Swallow whole.   Cholecalciferol (VITAMIN D -3 PO) Take 2,000 Units by mouth daily.   cyanocobalamin  (VITAMIN B12) 1000 MCG tablet Take 1,000 mcg by mouth daily.   famotidine-calcium  carbonate-magnesium  hydroxide (PEPCID COMPLETE) 10-800-165 MG chewable tablet Chew 1 tablet by mouth daily.   fluticasone  (FLONASE ) 50 MCG/ACT nasal spray Place 2 sprays into both nostrils daily as needed.   levothyroxine  (SYNTHROID ) 112 MCG tablet Take 1 tablet (112 mcg total) by mouth daily.   rosuvastatin  (CRESTOR ) 10 MG tablet Take 1 tablet (10 mg total) by mouth daily.   [DISCONTINUED] rosuvastatin  (CRESTOR ) 10 MG tablet Take 1 tablet (10 mg total) by mouth daily.   No facility-administered encounter medications on file as of 08/10/2023.     Lab Results  Component Value Date   WBC 4.3 03/16/2023   HGB 13.2 03/16/2023   HCT 41.1 03/16/2023   PLT 192.0 03/16/2023   GLUCOSE 94 08/07/2023   CHOL 141 08/07/2023   TRIG 65 08/07/2023   HDL 55 08/07/2023   LDLDIRECT 129.8 02/15/2012   LDLCALC 72 08/07/2023   ALT 9 08/07/2023   AST 16  08/07/2023   NA 142 08/07/2023   K 4.4 08/07/2023   CL 103 08/07/2023   CREATININE 0.86 08/07/2023   BUN 18 08/07/2023   CO2 32 08/07/2023   TSH 0.99 08/07/2023   HGBA1C 5.7 (H) 08/07/2023    MM 3D SCREENING MAMMOGRAM BILATERAL BREAST Result Date: 11/02/2022 CLINICAL DATA:  Screening. EXAM: DIGITAL SCREENING BILATERAL MAMMOGRAM WITH TOMOSYNTHESIS AND CAD TECHNIQUE: Bilateral screening digital craniocaudal and mediolateral oblique mammograms were obtained. Bilateral screening digital breast tomosynthesis was performed. The images were evaluated with computer-aided detection. COMPARISON:  Previous exam(s). ACR Breast Density Category b: There are scattered areas of fibroglandular density. FINDINGS: There are no findings suspicious for malignancy. IMPRESSION: No mammographic evidence of malignancy. A result letter of this screening mammogram will be mailed directly to the patient. RECOMMENDATION: Screening mammogram in one year. (Code:SM-B-01Y) BI-RADS CATEGORY  1: Negative. Electronically Signed   By: Alm Parkins M.D.   On: 11/02/2022 08:19       Assessment & Plan:  Daytime somnolence Assessment & Plan: Reports daytime somnolence. Does not feel rested when she wakes up. Discussed possible sleep apnea. Agreeable for referral to pulmonary for further evaluation.   Orders: -     Pulmonary Visit  Hyperglycemia Assessment & Plan: Low carb diet and exercise. Follow met b and A1c.  Lab Results  Component Value Date   HGBA1C 5.7 (H) 08/07/2023     Orders: -     Hemoglobin A1c; Future  Hypercholesterolemia Assessment & Plan: Low cholesterol diet and exercise.  Follow lipid panel.  Continue crestor . No changes today.   Lab Results  Component Value Date  CHOL 141 08/07/2023   HDL 55 08/07/2023   LDLCALC 72 08/07/2023   LDLDIRECT 129.8 02/15/2012   TRIG 65 08/07/2023   CHOLHDL 2.6 08/07/2023      Orders: -     Hepatic function panel; Future -     Lipid panel; Future -      Basic metabolic panel with GFR; Future -     Rosuvastatin  Calcium ; Take 1 tablet (10 mg total) by mouth daily.  Dispense: 90 tablet; Refill: 3  Pain in both knees, unspecified chronicity Assessment & Plan: Has seen ortho for her knees. S/p monovisc injection 11/23/22.   Gastroesophageal reflux disease, unspecified whether esophagitis present Assessment & Plan: Taking pepcid at night. Helping sleep.    History of colon polyps Assessment & Plan: Had colonoscopy 07/2018 - redundant colon and diverticulosis.  F/u recommended in 5 years.  Saw GI 07/03/23 - recommended hemoccult cards and check ferritin and iron panel.    Stress Assessment & Plan: Appears to be doing relatively well. Follow.    Severe obesity (BMI >= 40) (HCC) Assessment & Plan: Is s/p lap band surgery.  Had been on ozempic . Have discussed diet and exercise. Follow.    PMR (polymyalgia rheumatica) [facility] Assessment & Plan: Seeing Dr Tobie. Last seen 01/24/23. Stable. Off prednisone .    Hypothyroidism, unspecified type Assessment & Plan: On thyroid  replacement. Follow tsh.    Sinus congestion Assessment & Plan: Symptoms as outlined - congestion, dull headache, some previous unsteadiness. Appears to be improved. Continue saline nasal spray, steroid nasal spray. Mucinex prn. Hold abx.       Allena Hamilton, MD

## 2023-08-19 ENCOUNTER — Encounter: Payer: Self-pay | Admitting: Internal Medicine

## 2023-08-19 DIAGNOSIS — R0981 Nasal congestion: Secondary | ICD-10-CM | POA: Insufficient documentation

## 2023-08-19 NOTE — Assessment & Plan Note (Signed)
 Taking pepcid at night. Helping sleep.

## 2023-08-19 NOTE — Assessment & Plan Note (Signed)
 Appears to be doing relatively well. Follow.

## 2023-08-19 NOTE — Assessment & Plan Note (Signed)
 Symptoms as outlined - congestion, dull headache, some previous unsteadiness. Appears to be improved. Continue saline nasal spray, steroid nasal spray. Mucinex prn. Hold abx.

## 2023-08-19 NOTE — Assessment & Plan Note (Addendum)
 Had colonoscopy 07/2018 - redundant colon and diverticulosis.  F/u recommended in 5 years.  Saw GI 07/03/23 - recommended hemoccult cards and check ferritin and iron panel.

## 2023-08-19 NOTE — Assessment & Plan Note (Signed)
 Reports daytime somnolence. Does not feel rested when she wakes up. Discussed possible sleep apnea. Agreeable for referral to pulmonary for further evaluation.

## 2023-08-19 NOTE — Assessment & Plan Note (Signed)
 On thyroid replacement.  Follow tsh.

## 2023-08-19 NOTE — Assessment & Plan Note (Signed)
 Seeing Dr Tobie. Last seen 01/24/23. Stable. Off prednisone .

## 2023-08-19 NOTE — Assessment & Plan Note (Signed)
 Low cholesterol diet and exercise.  Follow lipid panel.  Continue crestor . No changes today.   Lab Results  Component Value Date   CHOL 141 08/07/2023   HDL 55 08/07/2023   LDLCALC 72 08/07/2023   LDLDIRECT 129.8 02/15/2012   TRIG 65 08/07/2023   CHOLHDL 2.6 08/07/2023

## 2023-08-19 NOTE — Assessment & Plan Note (Signed)
 Is s/p lap band surgery.  Had been on ozempic. Have discussed diet and exercise. Follow.

## 2023-08-19 NOTE — Assessment & Plan Note (Signed)
 Low carb diet and exercise. Follow met b and A1c.  Lab Results  Component Value Date   HGBA1C 5.7 (H) 08/07/2023

## 2023-08-19 NOTE — Assessment & Plan Note (Signed)
 Has seen ortho for her knees. S/p monovisc injection 11/23/22.

## 2023-08-21 ENCOUNTER — Ambulatory Visit: Admitting: Sleep Medicine

## 2023-10-05 ENCOUNTER — Other Ambulatory Visit: Payer: Self-pay | Admitting: Internal Medicine

## 2023-10-05 DIAGNOSIS — Z1231 Encounter for screening mammogram for malignant neoplasm of breast: Secondary | ICD-10-CM

## 2023-11-08 ENCOUNTER — Encounter

## 2023-11-26 ENCOUNTER — Ambulatory Visit: Payer: Self-pay | Admitting: *Deleted

## 2023-11-26 NOTE — Telephone Encounter (Signed)
 FYI Only or Action Required?: FYI only for provider: appointment scheduled on 11/18.  Patient was last seen in primary care on 08/10/2023 by Glendia Shad, MD.  Called Nurse Triage reporting Fall.  Symptoms began a week ago.  Interventions attempted: Rest, hydration, or home remedies.  Symptoms are: unchanged.  Triage Disposition: See Physician Within 24 Hours  Patient/caregiver understands and will follow disposition?: yes  Copied from CRM #[id number]. Topic: Clinical - Red Word Triage >> Nov 26, 2023 12:20 PM Rea BROCKS wrote: Red Word that prompted transfer to Nurse Triage: Merry Bills Daughter in [place] called in and stated that patient did not tell her that she fell last week. Patient is still dealing with severe pain and daughter in law would like to know next steps. Reason for Disposition  [1] MODERATE pain (e.g., interferes with normal activities) AND [2] high-risk adult (e.g., age > 60 years, osteoporosis, chronic steroid use)  Answer Assessment - Initial Assessment Questions 1. MECHANISM: How did the injury happen? Note: Consider the possibility of domestic violence or elder abuse.     Tripped on may- front porch, patient was unable to get up- had to slide to recliner 2. ONSET: When did the injury happen? (e.g., minutes or hours ago)     Monday - 1 week ago 3. LOCATION: What part of the back is injured?     Not sure 4. SEVERITY: Can you move the back normally?     Not sleeping well- unsure about her movement 5. PAIN: Is there any pain? If Yes, ask: How bad is the pain? (Scale 0-10; or none, mild, moderate, severe)     Moderate/severe 6. SIZE: For cuts, bruises, or swelling, ask: How large is it? (e.g., inches or centimeters)     Not sure 7. TETANUS: For any breaks in the skin, ask: When was your last tetanus booster?     na 8. NEUROLOGIC SYMPTOMS: Any weakness or numbness of the arms or legs?     unsure 9. OTHER SYMPTOMS: Do you have any other symptoms?  (e.g., abdomen pain, blood in urine)     Side pain  Protocols used: Back Injury-A-AH

## 2023-11-27 ENCOUNTER — Ambulatory Visit
Admission: RE | Admit: 2023-11-27 | Discharge: 2023-11-27 | Disposition: A | Discharge status: Home / Self Care | Source: Ambulatory Visit | Attending: Family | Admitting: Family

## 2023-11-27 ENCOUNTER — Encounter: Payer: Self-pay | Admitting: Family

## 2023-11-27 ENCOUNTER — Ambulatory Visit: Admitting: Family

## 2023-11-27 ENCOUNTER — Ambulatory Visit
Admission: RE | Admit: 2023-11-27 | Discharge: 2023-11-27 | Disposition: A | Discharge status: Home / Self Care | Attending: Family | Admitting: Family

## 2023-11-27 VITALS — BP 132/76 | HR 64 | Temp 97.8°F | Ht 65.0 in | Wt 257.2 lb

## 2023-11-27 DIAGNOSIS — M25512 Pain in left shoulder: Secondary | ICD-10-CM | POA: Diagnosis not present

## 2023-11-27 DIAGNOSIS — M25511 Pain in right shoulder: Secondary | ICD-10-CM

## 2023-11-27 DIAGNOSIS — M545 Low back pain, unspecified: Secondary | ICD-10-CM | POA: Diagnosis not present

## 2023-11-27 DIAGNOSIS — W19XXXA Unspecified fall, initial encounter: Secondary | ICD-10-CM

## 2023-11-27 DIAGNOSIS — M19012 Primary osteoarthritis, left shoulder: Secondary | ICD-10-CM | POA: Diagnosis not present

## 2023-11-28 NOTE — Progress Notes (Signed)
 Acute Office Visit  Subjective:     Patient ID: Jodi Harris, female    DOB: 1940/07/30, 83 y.o.   MRN: [id number]  Chief Complaint  Patient presents with  . Fall    HPI Patient is in an 83 year old female, patient of Dr. Glendia exam today after sustaining a fall last week on her front porch.  Patient reports that she was walking got tangled up in her feet, and ended up falling backwards.  Since that time she has had low back pain that did radiate down into her right leg but that is resolved.  She continues to have low back pain that she rates a 7 out of 10, worse with movement.  Pain is described as a sharp, grabbing sensation.  Denies hitting her head.  Reports having to crawl back into the house pull herself up on a couch before she was able to get up on her feet.  Overall she feels better today.  Has been taking ibuprofen and Tylenol that helps her symptoms.  Denies any loss of consciousness, no chest pain, palpitations, or shortness of breath.  Patient also reports left shoulder pain that has been ongoing unrelated to the fall.  Pain is 6 out of 10 and worse with movement.  Review of Systems  Constitutional: Negative.   HENT: Negative.    Respiratory: Negative.    Cardiovascular: Negative.   Musculoskeletal:  Positive for back pain.       Left shoulder pain. Ambulating with a walker.  Skin: Negative.   Neurological:  Negative for dizziness and headaches.  Endo/Heme/Allergies: Negative.   Psychiatric/Behavioral: Negative.    All other systems reviewed and are negative.  Past Medical History:  Diagnosis Date  . Anemia   . Diverticulosis   . GERD (gastroesophageal reflux disease)   . History of colonic polyps   . Hypercholesterolemia   . Hypothyroidism   . Internal hemorrhoids   . Thrombophlebitis    varicosities    Social History   Socioeconomic History  . Marital status: Married    Spouse name: Not on file  . Number of children: 2  . Years of education: Not  on file  . Highest education level: Not on file  Occupational History  . Not on file  Tobacco Use  . Smoking status: Never  . Smokeless tobacco: Never  . Tobacco comments:    smoked as a teenager  Vaping Use  . Vaping status: Never Used  Substance and Sexual Activity  . Alcohol use: Not Currently    Alcohol/week: 0.0 standard drinks of alcohol    Comment: has a glass of wine about once a month  . Drug use: No  . Sexual activity: Never  Other Topics Concern  . Not on file  Social History Narrative   Married   Social Drivers of Health   Financial Resource Strain: Low Risk  (07/03/2023)   Received from [facility]   Overall Financial Resource Strain (CARDIA)   . Difficulty of Paying Living Expenses: Not hard at all  Food Insecurity: No Food Insecurity (07/03/2023)   Received from [facility]   Hunger Vital Sign   . Within the past 12 months, you worried that your food would run out before you got the money to buy more.: Never true   . Within the past 12 months, the food you bought just didn't last and you didn't have money to get more.: Never true  Transportation Needs: No  Transportation Needs (07/03/2023)   Received from [facility]   [facility] - Transportation   . In the past 12 months, has lack of transportation kept you from medical appointments or from getting medications?: No   . Lack of Transportation (Non-Medical): No  Physical Activity: Inactive (03/06/2023)   Exercise Vital Sign   . Days of Exercise per Week: 0 days   . Minutes of Exercise per Session: 0 min  Stress: No Stress Concern Present (03/06/2023)   [employer] of Occupational Health - Occupational Stress Questionnaire   . Feeling of Stress : Not at all  Social Connections: Socially Integrated (03/06/2023)   Social Connection and Isolation Panel   . Frequency of Communication with Friends and Family: More than three times a week   . Frequency of Social  Gatherings with Friends and Family: More than three times a week   . Attends Religious Services: More than 4 times per year   . Active Member of Clubs or Organizations: Yes   . Attends Banker Meetings: More than 4 times per year   . Marital Status: Married  Catering Manager Violence: Not At Risk (03/06/2023)   Humiliation, Afraid, Rape, and Kick questionnaire   . Fear of Current or Ex-Partner: No   . Emotionally Abused: No   . Physically Abused: No   . Sexually Abused: No    Past Surgical History:  Procedure Laterality Date  . COLON SURGERY     laparoscopic assisted transverse colectomy for tubular adenomatous polyp  . COLONOSCOPY    . COLONOSCOPY WITH PROPOFOL  N/A 07/29/2018   Procedure: COLONOSCOPY WITH PROPOFOL ;  Surgeon: Gaylyn Gladis PENNER, MD;  Location: [facility] ENDOSCOPY;  Service: Endoscopy;  Laterality: N/A;  . DIAGNOSTIC LAPAROSCOPY    . LAPAROSCOPIC GASTRIC BANDING    . URETHRAL DIVERTICULUM REPAIR  1974   Dr Carlie    Family History  Problem Relation Age of Onset  . Cancer Father        lymphoma  . Cancer Maternal Aunt        Breast  . Breast cancer Maternal Aunt 50  . Colon cancer Other        one relative on maternal side    Allergies  Allergen Reactions  . Codeine Sulfate   . Penicillins     Current Outpatient Medications on File Prior to Visit  Medication Sig Dispense Refill  . aspirin EC 81 MG tablet Take 81 mg by mouth at bedtime as needed for mild pain. Swallow whole.    . Cholecalciferol (VITAMIN D -3 PO) Take 2,000 Units by mouth daily.    . cyanocobalamin  (VITAMIN B12) 1000 MCG tablet Take 1,000 mcg by mouth daily.    . famotidine-calcium  carbonate-magnesium  hydroxide (PEPCID COMPLETE) 10-800-165 MG chewable tablet Chew 1 tablet by mouth daily.    . fluticasone  (FLONASE ) 50 MCG/ACT nasal spray Place 2 sprays into both nostrils daily as needed. 16 g 2  . levothyroxine  (SYNTHROID ) 112 MCG tablet Take 1 tablet (112 mcg total) by mouth daily.  90 tablet 1  . rosuvastatin  (CRESTOR ) 10 MG tablet Take 1 tablet (10 mg total) by mouth daily. 90 tablet 3   No current facility-administered medications on file prior to visit.    BP 132/76   Pulse 64   Temp 97.8 F (36.6 C) (Oral)   Ht 5' 5 (1.651 m)   Wt 257 lb 3.2 oz (116.7 kg)   LMP 02/01/1993   SpO2 97%   BMI 42.80 kg/m  chart      Objective:    BP 132/76   Pulse 64   Temp 97.8 F (36.6 C) (Oral)   Ht 5' 5 (1.651 m)   Wt 257 lb 3.2 oz (116.7 kg)   LMP 02/01/1993   SpO2 97%   BMI 42.80 kg/m    Physical Exam Vitals and nursing note reviewed.  Constitutional:      Appearance: Normal appearance. She is obese.  Cardiovascular:     Rate and Rhythm: Normal rate and regular rhythm.     Pulses: Normal pulses.     Heart sounds: Normal heart sounds.  Pulmonary:     Effort: Pulmonary effort is normal.     Breath sounds: Normal breath sounds.  Musculoskeletal:        General: No swelling or deformity.       Arms:     Right lower leg: No edema.     Left lower leg: No edema.     Comments: Low back pain. Tender to palpation. Pain with rotation   Left shoulder. Tender to palpation posteriorly. No swelling  Skin:    General: Skin is warm and dry.  Neurological:     General: No focal deficit present.     Mental Status: She is alert and oriented to person, place, and time. Mental status is at baseline.  Psychiatric:        Mood and Affect: Mood normal.        Behavior: Behavior normal.        Thought Content: Thought content normal.    No results found for any visits on 11/27/23.      Assessment & Plan:   Problem List Items Addressed This Visit     Low back pain   Relevant Orders   DG Lumbar Spine Complete   Other Visit Diagnoses       Fall, initial encounter    -  Primary   Relevant Orders   DG Shoulder Right   DG Lumbar Spine Complete     Acute pain of right shoulder       Relevant Orders   DG Shoulder Right     Acute pain of left shoulder        Relevant Orders   DG Shoulder Left       No orders of the defined types were placed in this encounter.  Call the office if symptoms worsen or persist.  Recheck as scheduled and sooner as needed.  Patient sent to [facility] for x-rays of her left shoulder and low back.  Will discuss further treatment plan pending results and sooner as needed.  Patient declines any stronger pain medication.  She lives alone and is concerned about getting dizzy in the middle of the night when she has to get up and go to the bathroom.  Declines physical therapy currently but will let us  know obtain if she would like to do so in the future. No follow-ups on file.  Melvine Julin B Jalynn Betzold, FNP

## 2023-11-30 ENCOUNTER — Telehealth: Payer: Self-pay

## 2023-11-30 NOTE — Telephone Encounter (Signed)
 Copied from CRM [id number]. Topic: Clinical - Lab/Test Results >> Nov 30, 2023 10:35 AM Ashley SAUNDERS wrote: Reason for CRM: Requesting callback on Xray results, callback [phone]

## 2023-11-30 NOTE — Telephone Encounter (Signed)
 Please call Jodi Harris that I called radiology to see if they can go ahead and read her xray.  Also, please let her know that she does not need an appt at [facility]. They have a walk in clinic (this is a walk in for ortho problems only).  She can walk in. Hopefully we will have her xray results soon.

## 2023-11-30 NOTE — Telephone Encounter (Signed)
 Spoke with pt. Informed pt the results have not been finalized as of yet. Pt stated she was having pain informed pt that she can schedule a follow up with us  for re-eval pt declined. Informed pt about [facility]. Pt stated she would reach out to them to schedule an appt. Per chart review pt declined physical therapy when offered during acute visit with P. Douglass

## 2023-11-30 NOTE — Telephone Encounter (Signed)
 Pt.notified

## 2023-12-02 ENCOUNTER — Ambulatory Visit: Payer: Self-pay | Admitting: Family

## 2023-12-03 ENCOUNTER — Other Ambulatory Visit: Payer: Self-pay | Admitting: Orthopedic Surgery

## 2023-12-03 ENCOUNTER — Other Ambulatory Visit

## 2023-12-03 DIAGNOSIS — M546 Pain in thoracic spine: Secondary | ICD-10-CM | POA: Diagnosis not present

## 2023-12-03 DIAGNOSIS — M5416 Radiculopathy, lumbar region: Secondary | ICD-10-CM | POA: Diagnosis not present

## 2023-12-04 ENCOUNTER — Encounter: Payer: Self-pay | Admitting: Internal Medicine

## 2023-12-04 DIAGNOSIS — S22080A Wedge compression fracture of T11-T12 vertebra, initial encounter for closed fracture: Secondary | ICD-10-CM | POA: Insufficient documentation

## 2023-12-05 ENCOUNTER — Ambulatory Visit: Payer: Self-pay | Admitting: Internal Medicine

## 2023-12-05 ENCOUNTER — Other Ambulatory Visit

## 2023-12-05 DIAGNOSIS — E7800 Pure hypercholesterolemia, unspecified: Secondary | ICD-10-CM

## 2023-12-05 DIAGNOSIS — R739 Hyperglycemia, unspecified: Secondary | ICD-10-CM

## 2023-12-05 LAB — HEMOGLOBIN A1C: Hgb A1c MFr Bld: 5.6 % (ref 4.6–6.5)

## 2023-12-05 LAB — BASIC METABOLIC PANEL WITH GFR
BUN: 29 mg/dL — ABNORMAL HIGH (ref 6–23)
CO2: 32 meq/L (ref 19–32)
Calcium: 9.2 mg/dL (ref 8.4–10.5)
Chloride: 102 meq/L (ref 96–112)
Creatinine, Ser: 0.84 mg/dL (ref 0.40–1.20)
GFR: 64.43 mL/min (ref >60.00)
Glucose, Bld: 101 mg/dL — ABNORMAL HIGH (ref 70–99)
Potassium: 4.4 meq/L (ref 3.5–5.1)
Sodium: 139 meq/L (ref 135–145)

## 2023-12-05 LAB — LIPID PANEL
Cholesterol: 135 mg/dL (ref 0–200)
HDL: 53.6 mg/dL (ref >39.00)
LDL Cholesterol: 70 mg/dL (ref 0–99)
NonHDL: 81.63
Total CHOL/HDL Ratio: 3
Triglycerides: 59 mg/dL (ref 0.0–149.0)
VLDL: 11.8 mg/dL (ref 0.0–40.0)

## 2023-12-05 LAB — HEPATIC FUNCTION PANEL
ALT: 10 U/L (ref 0–35)
AST: 16 U/L (ref 0–37)
Albumin: 3.9 g/dL (ref 3.5–5.2)
Alkaline Phosphatase: 92 U/L (ref 39–117)
Bilirubin, Direct: 0.1 mg/dL (ref 0.0–0.3)
Total Bilirubin: 0.5 mg/dL (ref 0.2–1.2)
Total Protein: 6.3 g/dL (ref 6.0–8.3)

## 2023-12-10 ENCOUNTER — Telehealth

## 2023-12-10 ENCOUNTER — Encounter: Payer: Self-pay | Admitting: Emergency Medicine

## 2023-12-10 ENCOUNTER — Ambulatory Visit: Admitting: Internal Medicine

## 2023-12-10 ENCOUNTER — Telehealth: Payer: Self-pay

## 2023-12-10 DIAGNOSIS — R739 Hyperglycemia, unspecified: Secondary | ICD-10-CM

## 2023-12-10 DIAGNOSIS — Z0000 Encounter for general adult medical examination without abnormal findings: Secondary | ICD-10-CM

## 2023-12-10 DIAGNOSIS — E7800 Pure hypercholesterolemia, unspecified: Secondary | ICD-10-CM

## 2023-12-10 DIAGNOSIS — E039 Hypothyroidism, unspecified: Secondary | ICD-10-CM

## 2023-12-10 NOTE — Telephone Encounter (Signed)
 Copied from CRM #[id number]. Topic: Appointments - Appointment Info/Confirmation >> Dec 10, 2023  8:30 AM Jodi Harris wrote: Patient/patient representative is calling for information regarding an appointment.   Patient called wanting to see if she can do her appointment virtually because she had a fall and she did not want to come out. The doctor is aware of the fall and she is being treated for it. I told the patient that it is a physical  appointment and the office does not do physical appointments virtually. The patient told me that the doctor is going to go over her blood work that she had last week and not do gynecology  [phone]

## 2023-12-10 NOTE — Telephone Encounter (Signed)
 Pt will need to be rescheduled if she can not come in today for her physical. Yes, Dr Glendia will go over her labs, but it is also her physical as well.

## 2023-12-10 NOTE — Progress Notes (Deleted)
 Subjective:    Patient ID: Jodi Harris, female    DOB: 1940/11/22, 83 y.o.   MRN: [id number]  Patient here for No chief complaint on file.   HPI Here for a physical exam. Saw ortho 12/03/23 - T11 compression fracture and left psoas weakness. Recommended MRI (thoracic and lumbar). Treated with steroid pack, meloxicam and tizanidine. Also recommended bone density. Has seen ortho for her knees. S/p monovisc injection 11/23/22. Had f/u with Dr Tobie 01/24/23 - f/u PMR. Saw GI 07/03/23 - recommended check hemoccult cards, ferritin and iron panel. Last visit,referred to pulmonary - daytome somnolence.    Past Medical History:  Diagnosis Date   Anemia    Diverticulosis    GERD (gastroesophageal reflux disease)    History of colonic polyps    Hypercholesterolemia    Hypothyroidism    Internal hemorrhoids    Thrombophlebitis    varicosities   Past Surgical History:  Procedure Laterality Date   COLON SURGERY     laparoscopic assisted transverse colectomy for tubular adenomatous polyp   COLONOSCOPY     COLONOSCOPY WITH PROPOFOL  N/A 07/29/2018   Procedure: COLONOSCOPY WITH PROPOFOL ;  Surgeon: Gaylyn Gladis PENNER, MD;  Location: [facility] ENDOSCOPY;  Service: Endoscopy;  Laterality: N/A;   DIAGNOSTIC LAPAROSCOPY     LAPAROSCOPIC GASTRIC BANDING     URETHRAL DIVERTICULUM REPAIR  1974   Dr Carlie   Family History  Problem Relation Age of Onset   Cancer Father        lymphoma   Cancer Maternal Aunt        Breast   Breast cancer Maternal Aunt 39   Colon cancer Other        one relative on maternal side   Social History   Socioeconomic History   Marital status: Married    Spouse name: Not on file   Number of children: 2   Years of education: Not on file   Highest education level: Not on file  Occupational History   Not on file  Tobacco Use   Smoking status: Never   Smokeless tobacco: Never   Tobacco comments:    smoked as a teenager  Vaping Use   Vaping status: Never Used   Substance and Sexual Activity   Alcohol use: Not Currently    Alcohol/week: 0.0 standard drinks of alcohol    Comment: has a glass of wine about once a month   Drug use: No   Sexual activity: Never  Other Topics Concern   Not on file  Social History Narrative   Married   Social Drivers of Health   Financial Resource Strain: Low Risk  (07/03/2023)   Received from [facility]   Overall Financial Resource Strain (CARDIA)    Difficulty of Paying Living Expenses: Not hard at all  Food Insecurity: No Food Insecurity (07/03/2023)   Received from [facility]   Hunger Vital Sign    Within the past 12 months, you worried that your food would run out before you got the money to buy more.: Never true    Within the past 12 months, the food you bought just didn't last and you didn't have money to get more.: Never true  Transportation Needs: No Transportation Needs (07/03/2023)   Received from [facility] - Transportation    In the past 12 months, has lack of transportation kept you from medical appointments or from getting medications?: No    Lack  of Transportation (Non-Medical): No  Physical Activity: Inactive (03/06/2023)   Exercise Vital Sign    Days of Exercise per Week: 0 days    Minutes of Exercise per Session: 0 min  Stress: No Stress Concern Present (03/06/2023)   [employer] of Occupational Health - Occupational Stress Questionnaire    Feeling of Stress : Not at all  Social Connections: Socially Integrated (03/06/2023)   Social Connection and Isolation Panel    Frequency of Communication with Friends and Family: More than three times a week    Frequency of Social Gatherings with Friends and Family: More than three times a week    Attends Religious Services: More than 4 times per year    Active Member of [employer] or Organizations: Yes    Attends Banker Meetings: More than 4 times per year    Marital  Status: Married     Review of Systems     Objective:     LMP 02/01/1993  Wt Readings from Last 3 Encounters:  11/27/23 257 lb 3.2 oz (116.7 kg)  08/10/23 257 lb 6.4 oz (116.8 kg)  03/20/23 254 lb 3.2 oz (115.3 kg)    Physical Exam  {Perform Simple Foot Exam  Perform Detailed exam:1} {Insert foot Exam (Optional):30965}   Outpatient Encounter Medications as of 12/10/2023  Medication Sig   meloxicam (MOBIC) 7.5 MG tablet Take 1 tablet every day by oral route with meal(s) for 30 days.   methylPREDNISolone (MEDROL DOSEPAK) 4 MG TBPK tablet SMARTSIG:- Tablet(s) By Mouth -   tiZANidine (ZANAFLEX) 2 MG tablet Take 1 tablet every 6 hours by oral route as needed for 30 days.   aspirin EC 81 MG tablet Take 81 mg by mouth at bedtime as needed for mild pain. Swallow whole.   Cholecalciferol (VITAMIN D -3 PO) Take 2,000 Units by mouth daily.   cyanocobalamin  (VITAMIN B12) 1000 MCG tablet Take 1,000 mcg by mouth daily.   famotidine-calcium  carbonate-magnesium  hydroxide (PEPCID COMPLETE) 10-800-165 MG chewable tablet Chew 1 tablet by mouth daily.   fluticasone  (FLONASE ) 50 MCG/ACT nasal spray Place 2 sprays into both nostrils daily as needed.   levothyroxine  (SYNTHROID ) 112 MCG tablet Take 1 tablet (112 mcg total) by mouth daily.   rosuvastatin  (CRESTOR ) 10 MG tablet Take 1 tablet (10 mg total) by mouth daily.   No facility-administered encounter medications on file as of 12/10/2023.     Lab Results  Component Value Date   WBC 4.3 03/16/2023   HGB 13.2 03/16/2023   HCT 41.1 03/16/2023   PLT 192.0 03/16/2023   GLUCOSE 101 (H) 12/05/2023   CHOL 135 12/05/2023   TRIG 59.0 12/05/2023   HDL 53.60 12/05/2023   LDLDIRECT 129.8 02/15/2012   LDLCALC 70 12/05/2023   ALT 10 12/05/2023   AST 16 12/05/2023   NA 139 12/05/2023   K 4.4 12/05/2023   CL 102 12/05/2023   CREATININE 0.84 12/05/2023   BUN 29 (H) 12/05/2023   CO2 32 12/05/2023   TSH 0.99 08/07/2023   HGBA1C 5.6 12/05/2023     DG Shoulder Left Result Date: 11/30/2023 CLINICAL DATA:  Acute left shoulder pain after fall EXAM: LEFT SHOULDER - 3 VIEW COMPARISON:  None Available. FINDINGS: There is no evidence of fracture or dislocation. Mild degenerative changes of the glenohumeral and acromioclavicular joints. Soft tissues are unremarkable. IMPRESSION: 1. No acute fracture or dislocation. 2. Mild degenerative changes of the glenohumeral and acromioclavicular joints. Electronically Signed   By: Limin  Xu M.D.   On:  11/30/2023 13:58   DG Lumbar Spine Complete Result Date: 11/30/2023 EXAM: 4 VIEW(S) XRAY OF THE LUMBAR SPINE 11/27/2023 12:23:00 PM COMPARISON: 12/29/2019. 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 Similar levocurvature of the lumbar spine. CLINICAL HISTORY: pain/fall pain/fall FINDINGS: LUMBAR SPINE: BONES: No acute fracture. No aggressive appearing osseous lesion. Similar levocurvature of the lumbar spine. Lumbar lordosis is maintained. Grade 1 anterolisthesis of L3 on L4. No radiographic evidence of pars defect. Additional chronic irregularity of the anterior superior aspect of the L4 vertebral body which is new since 2021. DISCS AND DEGENERATIVE CHANGES: Interval progression of disc space narrowing at multiple levels. There is moderate disc space narrowing at L1-L2, L2-L3 and L4-L5. Additional mild disc space narrowing at L3-L4 and L5-S1. Facet arthrosis throughout the lumbar spine most pronounced in the lower lumbar spine. Additional degenerative endplate osteophytes at multiple levels. SOFT TISSUES: No acute abnormality. 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 THORACIC SPINE: Age indeterminate compression fracture of T11 with approximately 25% height loss anteriorly. IMPRESSION: 1. Age-indeterminate compression fracture of T11 with approximately 25% anterior height loss. 2. Grade 1  anterolisthesis of L3 on L4. 3. Degenerative changes increased since 2021. Moderate disc space narrowing at L1-L2, L2-L3, and L4-L5, with additional mild disc space narrowing at L3-L4 and L5-S1. 4. Facet arthrosis throughout the lumbar spine, most pronounced in the lower lumbar spine. 5. Chronic-appearing irregularity of the anterior superior aspect of the L4 vertebral body, new since 2021. Electronically signed by: Donnice Mania MD 11/30/2023 01:48 PM EST RP Workstation: [id number]       Assessment & Plan:  Health care maintenance  Hypothyroidism, unspecified type  Hypercholesterolemia  Hyperglycemia     Allena Hamilton, MD

## 2023-12-10 NOTE — Telephone Encounter (Signed)
 Ok to change appt to virtual appt and reschedule physical - if she wants to go over her labs.

## 2023-12-11 DIAGNOSIS — M5416 Radiculopathy, lumbar region: Secondary | ICD-10-CM | POA: Diagnosis not present

## 2023-12-11 DIAGNOSIS — M546 Pain in thoracic spine: Secondary | ICD-10-CM | POA: Diagnosis not present

## 2023-12-16 ENCOUNTER — Encounter: Payer: Self-pay | Admitting: Internal Medicine

## 2023-12-16 NOTE — Progress Notes (Signed)
 Patient ID: Jodi Harris, female   DOB: [birth year], 83 y.o.   MRN: [id number] Did not show for appt.

## 2023-12-17 DIAGNOSIS — M5416 Radiculopathy, lumbar region: Secondary | ICD-10-CM | POA: Diagnosis not present

## 2023-12-18 ENCOUNTER — Encounter: Payer: Self-pay | Admitting: Internal Medicine

## 2023-12-25 ENCOUNTER — Encounter: Payer: Self-pay | Admitting: Internal Medicine

## 2023-12-25 ENCOUNTER — Ambulatory Visit: Admitting: Internal Medicine

## 2023-12-25 VITALS — BP 126/80 | HR 67 | Temp 97.8°F | Ht 65.0 in | Wt 264.0 lb

## 2023-12-25 DIAGNOSIS — E039 Hypothyroidism, unspecified: Secondary | ICD-10-CM | POA: Diagnosis not present

## 2023-12-25 DIAGNOSIS — K219 Gastro-esophageal reflux disease without esophagitis: Secondary | ICD-10-CM

## 2023-12-25 DIAGNOSIS — R739 Hyperglycemia, unspecified: Secondary | ICD-10-CM

## 2023-12-25 DIAGNOSIS — Z6841 Body Mass Index (BMI) 40.0 and over, adult: Secondary | ICD-10-CM | POA: Diagnosis not present

## 2023-12-25 DIAGNOSIS — S22080D Wedge compression fracture of T11-T12 vertebra, subsequent encounter for fracture with routine healing: Secondary | ICD-10-CM | POA: Diagnosis not present

## 2023-12-25 DIAGNOSIS — Z860100 Personal history of colon polyps, unspecified: Secondary | ICD-10-CM | POA: Diagnosis not present

## 2023-12-25 DIAGNOSIS — Z0000 Encounter for general adult medical examination without abnormal findings: Secondary | ICD-10-CM

## 2023-12-25 DIAGNOSIS — E7800 Pure hypercholesterolemia, unspecified: Secondary | ICD-10-CM

## 2023-12-25 DIAGNOSIS — E559 Vitamin D deficiency, unspecified: Secondary | ICD-10-CM | POA: Diagnosis not present

## 2023-12-25 DIAGNOSIS — Z23 Encounter for immunization: Secondary | ICD-10-CM

## 2023-12-25 MED ORDER — LEVOTHYROXINE SODIUM 112 MCG PO TABS: 112.0000 ug | ORAL_TABLET | Freq: Every day | ORAL | 1 refills | Status: AC

## 2023-12-25 NOTE — Assessment & Plan Note (Signed)
 Physical today 12/25/23.  Mammogram 10/31/22 - birads I. Overdue mammogram. Need to schedule. Colonoscopy 07/2018 - recommended f/u in 5 years.  Due.

## 2023-12-25 NOTE — Progress Notes (Signed)
 "  Subjective:    Patient ID: Jodi Harris, female    DOB: Sep 11, 1940, 83 y.o.   MRN: [id number]  Patient here for  Chief Complaint  Patient presents with   Medical Management of Chronic Issues   Annual Exam    HPI Here for a physical exam. Saw ortho recently 12/03/23 - T11 compression fracture and left psoas weakness. Recommended MRI (thoracic and lumbar) and bone density - refracture at T11.  Recommended methocarbamol. PT. Back is some better. PT planned. Planned right side L3-4 transforaminal injection. Need bone density result and MRI result - [facility]. Requested. Notices when lying on right side, increased urination. Nocturia - varies - can be up to 4-5x/night. Was noticing some cough at night. Taking pepcid q hs. Sleeping better.    Past Medical History:  Diagnosis Date   Anemia    Diverticulosis    GERD (gastroesophageal reflux disease)    History of colonic polyps    Hypercholesterolemia    Hypothyroidism    Internal hemorrhoids    Thrombophlebitis    varicosities   Past Surgical History:  Procedure Laterality Date   COLON SURGERY     laparoscopic assisted transverse colectomy for tubular adenomatous polyp   COLONOSCOPY     COLONOSCOPY WITH PROPOFOL  N/A 07/29/2018   Procedure: COLONOSCOPY WITH PROPOFOL ;  Surgeon: Gaylyn Gladis PENNER, MD;  Location: [facility] ENDOSCOPY;  Service: Endoscopy;  Laterality: N/A;   DIAGNOSTIC LAPAROSCOPY     LAPAROSCOPIC GASTRIC BANDING     URETHRAL DIVERTICULUM REPAIR  1974   Dr Carlie   Family History  Problem Relation Age of Onset   Cancer Father        lymphoma   Cancer Maternal Aunt        Breast   Breast cancer Maternal Aunt 15   Colon cancer Other        one relative on maternal side   Social History   Socioeconomic History   Marital status: Married    Spouse name: Not on file   Number of children: 2   Years of education: Not on file   Highest education level: Not on file  Occupational History   Not on file  Tobacco Use    Smoking status: Never   Smokeless tobacco: Never   Tobacco comments:    smoked as a teenager  Vaping Use   Vaping status: Never Used  Substance and Sexual Activity   Alcohol use: Not Currently    Alcohol/week: 0.0 standard drinks of alcohol    Comment: has a glass of wine about once a month   Drug use: No   Sexual activity: Never  Other Topics Concern   Not on file  Social History Narrative   Married   Social Drivers of Health   Tobacco Use: Low Risk (01/04/2024)   Patient History    Smoking Tobacco Use: Never    Smokeless Tobacco Use: Never    Passive Exposure: Not on file  Financial Resource Strain: Low Risk  (07/03/2023)   Received from [facility]   Overall Financial Resource Strain (CARDIA)    Difficulty of Paying Living Expenses: Not hard at all  Food Insecurity: No Food Insecurity (07/03/2023)   Received from [facility]   Epic    Within the past 12 months, you worried that your food would run out before you got the money to buy more.: Never true    Within the past 12 months, the food you bought  just didn't last and you didn't have money to get more.: Never true  Transportation Needs: No Transportation Needs (07/03/2023)   Received from [facility] - Transportation    In the past 12 months, has lack of transportation kept you from medical appointments or from getting medications?: No    Lack of Transportation (Non-Medical): No  Physical Activity: Inactive (03/06/2023)   Exercise Vital Sign    Days of Exercise per Week: 0 days    Minutes of Exercise per Session: 0 min  Stress: No Stress Concern Present (03/06/2023)   [employer] - Occupational Stress Questionnaire    Feeling of Stress : Not at all  Social Connections: Socially Integrated (03/06/2023)   Social Connection and Isolation Panel    Frequency of Communication with Friends and Family: More than three times a week     Frequency of Social Gatherings with Friends and Family: More than three times a week    Attends Religious Services: More than 4 times per year    Active Member of Clubs or Organizations: Yes    Attends Banker Meetings: More than 4 times per year    Marital Status: Married  Depression (PHQ2-9): Low Risk (11/27/2023)   Depression (PHQ2-9)    PHQ-2 Score: 0  Alcohol Screen: Low Risk (03/06/2023)   Alcohol Screen    Last Alcohol Screening Score (AUDIT): 1  Housing: Low Risk  (07/03/2023)   Received from [facility]   Epic    In the last 12 months, was there a time when you were not able to pay the mortgage or rent on time?: No    In the past 12 months, how many times have you moved where you were living?: 0    At any time in the past 12 months, were you homeless or living in a shelter (including now)?: No  Utilities: Not At Risk (07/03/2023)   Received from [facility]   Epic    In the past 12 months has the electric, gas, oil, or water company threatened to shut off services in your home?: No  Health Literacy: Adequate Health Literacy (03/06/2023)   B1300 Health Literacy    Frequency of need for help with medical instructions: Never     Review of Systems  Constitutional:  Negative for appetite change and unexpected weight change.  HENT:  Negative for congestion, sinus pressure and sore throat.   Eyes:  Negative for pain and visual disturbance.  Respiratory:  Negative for cough, chest tightness and shortness of breath.   Cardiovascular:  Negative for chest pain and palpitations.       No increased swelling.   Gastrointestinal:  Negative for abdominal pain, diarrhea, nausea and vomiting.  Genitourinary:  Negative for difficulty urinating and frequency.  Musculoskeletal:  Positive for back pain. Negative for joint swelling.  Skin:  Negative for color change and rash.  Neurological:  Negative for dizziness and headaches.  Hematological:   Negative for adenopathy. Does not bruise/bleed easily.  Psychiatric/Behavioral:  Negative for agitation and dysphoric mood.        Objective:     BP 126/80   Pulse 67   Temp 97.8 F (36.6 C) (Oral)   Ht 5' 5 (1.651 m)   Wt 264 lb (119.7 kg)   LMP 02/01/1993   SpO2 94%   BMI 43.93 kg/m  Wt Readings from Last 3 Encounters:  12/25/23 264 lb (119.7 kg)  11/27/23 257 lb 3.2 oz (116.7 kg)  08/10/23 257 lb 6.4 oz (116.8 kg)    Physical Exam Vitals reviewed.  Constitutional:      General: She is not in acute distress.    Appearance: Normal appearance. She is well-developed.  HENT:     Head: Normocephalic and atraumatic.     Right Ear: External ear normal.     Left Ear: External ear normal.     Mouth/Throat:     Pharynx: No oropharyngeal exudate or posterior oropharyngeal erythema.  Eyes:     General: No scleral icterus.       Right eye: No discharge.        Left eye: No discharge.     Conjunctiva/sclera: Conjunctivae normal.  Neck:     Thyroid : No thyromegaly.  Cardiovascular:     Rate and Rhythm: Normal rate and regular rhythm.  Pulmonary:     Effort: No tachypnea, accessory muscle usage or respiratory distress.     Breath sounds: Normal breath sounds. No decreased breath sounds or wheezing.  Chest:  Breasts:    Right: No inverted nipple, mass, nipple discharge or tenderness (no axillary adenopathy).     Left: No inverted nipple, mass, nipple discharge or tenderness (no axilarry adenopathy).  Abdominal:     General: Bowel sounds are normal.     Palpations: Abdomen is soft.     Tenderness: There is no abdominal tenderness.  Musculoskeletal:        General: No swelling or tenderness.     Cervical back: Neck supple.  Lymphadenopathy:     Cervical: No cervical adenopathy.  Skin:    Findings: No erythema or rash.  Neurological:     Mental Status: She is alert and oriented to person, place, and time.  Psychiatric:        Mood and Affect: Mood normal.         Behavior: Behavior normal.         Outpatient Encounter Medications as of 12/25/2023  Medication Sig   aspirin EC 81 MG tablet Take 81 mg by mouth at bedtime as needed for mild pain. Swallow whole.   Cholecalciferol (VITAMIN D -3 PO) Take 2,000 Units by mouth daily.   cyanocobalamin  (VITAMIN B12) 1000 MCG tablet Take 1,000 mcg by mouth daily.   famotidine-calcium  carbonate-magnesium  hydroxide (PEPCID COMPLETE) 10-800-165 MG chewable tablet Chew 1 tablet by mouth daily.   fluticasone  (FLONASE ) 50 MCG/ACT nasal spray Place 2 sprays into both nostrils daily as needed.   meloxicam (MOBIC) 7.5 MG tablet Take 1 tablet every day by oral route with meal(s) for 30 days.   methylPREDNISolone (MEDROL DOSEPAK) 4 MG TBPK tablet SMARTSIG:- Tablet(s) By Mouth -   rosuvastatin  (CRESTOR ) 10 MG tablet Take 1 tablet (10 mg total) by mouth daily.   tiZANidine (ZANAFLEX) 2 MG tablet Take 1 tablet every 6 hours by oral route as needed for 30 days.   [DISCONTINUED] levothyroxine  (SYNTHROID ) 112 MCG tablet Take 1 tablet (112 mcg total) by mouth daily.   levothyroxine  (SYNTHROID ) 112 MCG tablet Take 1 tablet (112 mcg total) by mouth daily.   No facility-administered encounter medications on file as of 12/25/2023.     Lab Results  Component Value Date   WBC 4.3 03/16/2023   HGB 13.2 03/16/2023   HCT 41.1 03/16/2023   PLT 192.0 03/16/2023   GLUCOSE 101 (H) 12/05/2023   CHOL 135 12/05/2023   TRIG 59.0 12/05/2023   HDL 53.60 12/05/2023   LDLDIRECT 129.8 02/15/2012  LDLCALC 70 12/05/2023   ALT 10 12/05/2023   AST 16 12/05/2023   NA 139 12/05/2023   K 4.4 12/05/2023   CL 102 12/05/2023   CREATININE 0.84 12/05/2023   BUN 29 (H) 12/05/2023   CO2 32 12/05/2023   TSH 0.99 08/07/2023   HGBA1C 5.6 12/05/2023    DG Shoulder Left Result Date: 11/30/2023 CLINICAL DATA:  Acute left shoulder pain after fall EXAM: LEFT SHOULDER - 3 VIEW COMPARISON:  None Available. FINDINGS: There is no evidence of fracture  or dislocation. Mild degenerative changes of the glenohumeral and acromioclavicular joints. Soft tissues are unremarkable. IMPRESSION: 1. No acute fracture or dislocation. 2. Mild degenerative changes of the glenohumeral and acromioclavicular joints. Electronically Signed   By: Limin  Xu M.D.   On: 11/30/2023 13:58   DG Lumbar Spine Complete Result Date: 11/30/2023 EXAM: 4 VIEW(S) XRAY OF THE LUMBAR SPINE 11/27/2023 12:23:00 PM COMPARISON: 12/29/2019. 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 Similar levocurvature of the lumbar spine. CLINICAL HISTORY: pain/fall pain/fall FINDINGS: LUMBAR SPINE: BONES: No acute fracture. No aggressive appearing osseous lesion. Similar levocurvature of the lumbar spine. Lumbar lordosis is maintained. Grade 1 anterolisthesis of L3 on L4. No radiographic evidence of pars defect. Additional chronic irregularity of the anterior superior aspect of the L4 vertebral body which is new since 2021. DISCS AND DEGENERATIVE CHANGES: Interval progression of disc space narrowing at multiple levels. There is moderate disc space narrowing at L1-L2, L2-L3 and L4-L5. Additional mild disc space narrowing at L3-L4 and L5-S1. Facet arthrosis throughout the lumbar spine most pronounced in the lower lumbar spine. Additional degenerative endplate osteophytes at multiple levels. SOFT TISSUES: No acute abnormality. 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 THORACIC SPINE: Age indeterminate compression fracture of T11 with approximately 25% height loss anteriorly. IMPRESSION: 1. Age-indeterminate compression fracture of T11 with approximately 25% anterior height loss. 2. Grade 1 anterolisthesis of L3 on L4. 3. Degenerative changes increased since 2021. Moderate disc space narrowing at L1-L2, L2-L3, and L4-L5, with additional mild disc space narrowing at L3-L4 and L5-S1. 4. Facet  arthrosis throughout the lumbar spine, most pronounced in the lower lumbar spine. 5. Chronic-appearing irregularity of the anterior superior aspect of the L4 vertebral body, new since 2021. Electronically signed by: Donnice Mania MD 11/30/2023 01:48 PM EST RP Workstation: [id number]       Assessment & Plan:  Routine general medical examination at a health care facility  Health care maintenance Assessment & Plan: Physical today 12/25/23.  Mammogram 10/31/22 - birads I. Overdue mammogram. Need to schedule. Colonoscopy 07/2018 - recommended f/u in 5 years.  Due.    Hypothyroidism, unspecified type Assessment & Plan: On thyroid  replacement. Follow tsh.   Orders: -     Levothyroxine  Sodium; Take 1 tablet (112 mcg total) by mouth daily.  Dispense: 90 tablet; Refill: 1  Hypercholesterolemia Assessment & Plan: Low cholesterol diet and exercise.  Follow lipid panel.  Continue crestor . No change in medication today.  Lab Results  Component Value Date   CHOL 135 12/05/2023   HDL 53.60 12/05/2023   LDLCALC 70 12/05/2023   LDLDIRECT 129.8 02/15/2012   TRIG 59.0 12/05/2023   CHOLHDL 3 12/05/2023      Orders: -     Lipid panel; Future -     Hepatic function panel; Future -     Basic metabolic panel with GFR; Future -  CBC with Differential/Platelet; Future  Hyperglycemia Assessment & Plan: Low carb diet and exercise. Follow met b and A1c.  Lab Results  Component Value Date   HGBA1C 5.6 12/05/2023     Orders: -     Hemoglobin A1c; Future  Need for influenza vaccination -     Flu vaccine HIGH DOSE PF(Fluzone Trivalent)  Vitamin D  deficiency Assessment & Plan: Check vitamin D  level with next labs.    Severe obesity (BMI >= 40) (HCC) Assessment & Plan: Is s/p lap band surgery.  Had been on ozempic . Continue diet and exercise.    History of colon polyps Assessment & Plan: Had colonoscopy 07/2018 - redundant colon and diverticulosis.  F/u recommended in 5 years.  Saw GI  07/03/23 - recommended hemoccult cards and check ferritin and iron panel. Iron studies ok.    Gastroesophageal reflux disease, unspecified whether esophagitis present Assessment & Plan: Taking pepcid q hs. Sleeping better. Follow.    Compression fracture of T11 vertebra with routine healing, subsequent encounter Assessment & Plan: Saw ortho 12/03/23 - T11 compression fracture and left psoas weakness. Recommended MRI (thoracic and lumbar). Treated with steroid pack, meloxicam and tizanidine. Also recommended bone density. Back is better. PT planned.       Allena Hamilton, MD "

## 2024-01-04 ENCOUNTER — Encounter: Payer: Self-pay | Admitting: Internal Medicine

## 2024-01-04 NOTE — Assessment & Plan Note (Signed)
 Had colonoscopy 07/2018 - redundant colon and diverticulosis.  F/u recommended in 5 years.  Saw GI 07/03/23 - recommended hemoccult cards and check ferritin and iron panel. Iron studies ok.

## 2024-01-04 NOTE — Assessment & Plan Note (Signed)
 Low cholesterol diet and exercise.  Follow lipid panel.  Continue crestor . No change in medication today.  Lab Results  Component Value Date   CHOL 135 12/05/2023   HDL 53.60 12/05/2023   LDLCALC 70 12/05/2023   LDLDIRECT 129.8 02/15/2012   TRIG 59.0 12/05/2023   CHOLHDL 3 12/05/2023

## 2024-01-04 NOTE — Assessment & Plan Note (Signed)
 Saw ortho 12/03/23 - T11 compression fracture and left psoas weakness. Recommended MRI (thoracic and lumbar). Treated with steroid pack, meloxicam and tizanidine. Also recommended bone density. Back is better. PT planned.

## 2024-01-04 NOTE — Assessment & Plan Note (Signed)
 Check vitamin D level with next labs.  ?

## 2024-01-04 NOTE — Assessment & Plan Note (Signed)
 Taking pepcid q hs. Sleeping better. Follow.

## 2024-01-04 NOTE — Assessment & Plan Note (Signed)
 Low carb diet and exercise. Follow met b and A1c.  Lab Results  Component Value Date   HGBA1C 5.6 12/05/2023

## 2024-01-04 NOTE — Assessment & Plan Note (Signed)
 On thyroid replacement.  Follow tsh.

## 2024-01-04 NOTE — Assessment & Plan Note (Signed)
 Is s/p lap band surgery.  Had been on ozempic . Continue diet and exercise.

## 2024-01-21 ENCOUNTER — Other Ambulatory Visit: Payer: Self-pay | Admitting: Nurse Practitioner

## 2024-01-21 DIAGNOSIS — R600 Localized edema: Secondary | ICD-10-CM

## 2024-01-23 ENCOUNTER — Ambulatory Visit

## 2024-01-25 ENCOUNTER — Ambulatory Visit
Admission: RE | Admit: 2024-01-25 | Discharge: 2024-01-25 | Disposition: A | Discharge status: Home / Self Care | Source: Ambulatory Visit | Attending: Nurse Practitioner | Admitting: Nurse Practitioner

## 2024-01-25 DIAGNOSIS — R600 Localized edema: Secondary | ICD-10-CM | POA: Insufficient documentation

## 2024-02-06 ENCOUNTER — Telehealth: Payer: Self-pay

## 2024-02-06 NOTE — Telephone Encounter (Signed)
 See if agreeable for appt (ok if virtual or in person) to discuss weight loss options - appt 02/11/24 at 2:00. Thanks.

## 2024-02-06 NOTE — Telephone Encounter (Signed)
 Pt would like you to send in Zepbound , whether the PA is needed or not she would like tio do self pay, I explained to her that she could call her insurance and see what they would cover and we could send it in, pt stated that's she wants you to send in Zepbound 

## 2024-02-06 NOTE — Telephone Encounter (Signed)
 Copied from CRM #[id number]. Topic: Clinical - Medication Question >> Feb 06, 2024 11:34 AM Harlene ORN wrote: Reason for CRM: dicsuss the prices on weightloss medications like Zepbound .Kathline the prices are going to be reduced for 2026. Please call back the patient to discuss.

## 2024-02-07 NOTE — Telephone Encounter (Signed)
 Appt made to be virtual to discuss meds

## 2024-02-11 ENCOUNTER — Ambulatory Visit: Admitting: Internal Medicine

## 2024-02-27 ENCOUNTER — Ambulatory Visit: Admitting: Internal Medicine

## 2024-03-10 ENCOUNTER — Ambulatory Visit: Payer: Medicare PPO

## 2024-03-25 ENCOUNTER — Other Ambulatory Visit

## 2024-03-27 ENCOUNTER — Ambulatory Visit: Admitting: Internal Medicine
